# Patient Record
Sex: Male | Born: 1950 | Race: Black or African American | Hispanic: No | Marital: Married | Smoking: Never smoker
Health system: Southern US, Community
[De-identification: ages and names within clinical notes are randomized; demographics above are authoritative.]

## PROBLEM LIST (undated history)

## (undated) DIAGNOSIS — I10 Essential (primary) hypertension: Secondary | ICD-10-CM

## (undated) DIAGNOSIS — E876 Hypokalemia: Secondary | ICD-10-CM

## (undated) DIAGNOSIS — K219 Gastro-esophageal reflux disease without esophagitis: Secondary | ICD-10-CM

## (undated) DIAGNOSIS — R011 Cardiac murmur, unspecified: Secondary | ICD-10-CM

## (undated) DIAGNOSIS — R972 Elevated prostate specific antigen [PSA]: Secondary | ICD-10-CM

## (undated) DIAGNOSIS — N400 Enlarged prostate without lower urinary tract symptoms: Secondary | ICD-10-CM

## (undated) DIAGNOSIS — F329 Major depressive disorder, single episode, unspecified: Secondary | ICD-10-CM

## (undated) DIAGNOSIS — F32A Depression, unspecified: Secondary | ICD-10-CM

## (undated) DIAGNOSIS — M722 Plantar fascial fibromatosis: Secondary | ICD-10-CM

## (undated) DIAGNOSIS — N529 Male erectile dysfunction, unspecified: Secondary | ICD-10-CM

## (undated) DIAGNOSIS — E669 Obesity, unspecified: Secondary | ICD-10-CM

## (undated) HISTORY — DX: Obesity, unspecified: E66.9

## (undated) HISTORY — DX: Essential (primary) hypertension: I10

## (undated) HISTORY — DX: Gastro-esophageal reflux disease without esophagitis: K21.9

## (undated) HISTORY — DX: Benign prostatic hyperplasia without lower urinary tract symptoms: N40.0

## (undated) HISTORY — DX: Male erectile dysfunction, unspecified: N52.9

## (undated) HISTORY — PX: KNEE SURGERY: SHX244

## (undated) HISTORY — DX: Hypokalemia: E87.6

## (undated) HISTORY — DX: Plantar fascial fibromatosis: M72.2

## (undated) HISTORY — DX: Elevated prostate specific antigen (PSA): R97.20

---

## 2004-07-27 ENCOUNTER — Ambulatory Visit: Payer: Self-pay | Admitting: Internal Medicine

## 2004-08-22 ENCOUNTER — Ambulatory Visit: Payer: Self-pay | Admitting: Internal Medicine

## 2004-10-03 ENCOUNTER — Ambulatory Visit: Payer: Self-pay | Admitting: Internal Medicine

## 2004-11-07 ENCOUNTER — Ambulatory Visit: Payer: Self-pay | Admitting: Internal Medicine

## 2005-02-17 ENCOUNTER — Ambulatory Visit: Payer: Self-pay | Admitting: Internal Medicine

## 2005-05-23 ENCOUNTER — Ambulatory Visit: Payer: Self-pay | Admitting: Internal Medicine

## 2005-05-30 ENCOUNTER — Ambulatory Visit: Payer: Self-pay | Admitting: Gastroenterology

## 2005-06-21 ENCOUNTER — Ambulatory Visit: Payer: Self-pay | Admitting: Gastroenterology

## 2005-06-21 ENCOUNTER — Encounter (INDEPENDENT_AMBULATORY_CARE_PROVIDER_SITE_OTHER): Payer: Self-pay | Admitting: Specialist

## 2005-10-05 ENCOUNTER — Ambulatory Visit: Payer: Self-pay | Admitting: Internal Medicine

## 2006-09-24 ENCOUNTER — Ambulatory Visit: Payer: Self-pay | Admitting: Internal Medicine

## 2007-02-12 ENCOUNTER — Ambulatory Visit: Payer: Self-pay | Admitting: Internal Medicine

## 2007-02-12 LAB — CONVERTED CEMR LAB
ALT: 26 units/L (ref 0–40)
AST: 26 units/L (ref 0–37)
Albumin: 3.6 g/dL (ref 3.5–5.2)
Alkaline Phosphatase: 56 units/L (ref 39–117)
BUN: 16 mg/dL (ref 6–23)
Basophils Absolute: 0 10*3/uL (ref 0.0–0.1)
Calcium: 8.9 mg/dL (ref 8.4–10.5)
Chloride: 106 meq/L (ref 96–112)
Cholesterol: 155 mg/dL (ref 0–200)
Creatinine, Ser: 0.8 mg/dL (ref 0.4–1.5)
Eosinophils Absolute: 0.1 10*3/uL (ref 0.0–0.6)
GFR calc Af Amer: 129 mL/min
GFR calc non Af Amer: 106 mL/min
HCT: 40.5 % (ref 39.0–52.0)
Hemoglobin: 14 g/dL (ref 13.0–17.0)
LDL Cholesterol: 104 mg/dL — ABNORMAL HIGH (ref 0–99)
MCHC: 34.6 g/dL (ref 30.0–36.0)
Neutrophils Relative %: 45.1 % (ref 43.0–77.0)
RBC: 4.8 M/uL (ref 4.22–5.81)
RDW: 13.7 % (ref 11.5–14.6)
Sodium: 144 meq/L (ref 135–145)
Total Bilirubin: 0.8 mg/dL (ref 0.3–1.2)
Total CHOL/HDL Ratio: 3.6

## 2007-02-19 ENCOUNTER — Ambulatory Visit: Payer: Self-pay | Admitting: Internal Medicine

## 2007-02-20 ENCOUNTER — Encounter: Payer: Self-pay | Admitting: Internal Medicine

## 2007-02-20 DIAGNOSIS — K219 Gastro-esophageal reflux disease without esophagitis: Secondary | ICD-10-CM

## 2007-02-20 DIAGNOSIS — I10 Essential (primary) hypertension: Secondary | ICD-10-CM

## 2007-02-20 DIAGNOSIS — N400 Enlarged prostate without lower urinary tract symptoms: Secondary | ICD-10-CM | POA: Insufficient documentation

## 2007-02-20 HISTORY — DX: Essential (primary) hypertension: I10

## 2007-02-20 HISTORY — DX: Benign prostatic hyperplasia without lower urinary tract symptoms: N40.0

## 2007-02-20 HISTORY — DX: Gastro-esophageal reflux disease without esophagitis: K21.9

## 2007-04-01 ENCOUNTER — Emergency Department (HOSPITAL_COMMUNITY): Admission: EM | Admit: 2007-04-01 | Discharge: 2007-04-01 | Payer: Self-pay | Admitting: Emergency Medicine

## 2007-09-30 ENCOUNTER — Ambulatory Visit: Payer: Self-pay | Admitting: Internal Medicine

## 2007-09-30 DIAGNOSIS — M722 Plantar fascial fibromatosis: Secondary | ICD-10-CM

## 2007-09-30 HISTORY — DX: Plantar fascial fibromatosis: M72.2

## 2008-02-24 ENCOUNTER — Ambulatory Visit: Payer: Self-pay | Admitting: Internal Medicine

## 2008-02-24 LAB — CONVERTED CEMR LAB
AST: 28 units/L (ref 0–37)
Albumin: 3.6 g/dL (ref 3.5–5.2)
Alkaline Phosphatase: 65 units/L (ref 39–117)
BUN: 11 mg/dL (ref 6–23)
Bilirubin Urine: NEGATIVE
Bilirubin, Direct: 0.1 mg/dL (ref 0.0–0.3)
Blood in Urine, dipstick: NEGATIVE
Chloride: 100 meq/L (ref 96–112)
Eosinophils Absolute: 0.1 10*3/uL (ref 0.0–0.7)
Eosinophils Relative: 3 % (ref 0.0–5.0)
GFR calc non Af Amer: 106 mL/min
Glucose, Bld: 100 mg/dL — ABNORMAL HIGH (ref 70–99)
HDL: 37.6 mg/dL — ABNORMAL LOW (ref >39.0)
Monocytes Relative: 9.8 % (ref 3.0–12.0)
Neutrophils Relative %: 37 % — ABNORMAL LOW (ref 43.0–77.0)
Platelets: 237 10*3/uL (ref 150–400)
Potassium: 3.4 meq/L — ABNORMAL LOW (ref 3.5–5.1)
Protein, U semiquant: NEGATIVE
Sodium: 140 meq/L (ref 135–145)
Total CHOL/HDL Ratio: 4.2
Urobilinogen, UA: 0.2
VLDL: 14 mg/dL (ref 0–40)
WBC: 4 10*3/uL — ABNORMAL LOW (ref 4.5–10.5)
pH: 5

## 2008-03-02 ENCOUNTER — Ambulatory Visit: Payer: Self-pay | Admitting: Internal Medicine

## 2008-11-20 ENCOUNTER — Telehealth: Payer: Self-pay | Admitting: Internal Medicine

## 2008-11-23 ENCOUNTER — Ambulatory Visit: Payer: Self-pay | Admitting: Internal Medicine

## 2008-11-23 DIAGNOSIS — M25569 Pain in unspecified knee: Secondary | ICD-10-CM

## 2008-11-27 ENCOUNTER — Encounter: Payer: Self-pay | Admitting: Internal Medicine

## 2008-12-21 ENCOUNTER — Ambulatory Visit: Payer: Self-pay | Admitting: Internal Medicine

## 2008-12-21 DIAGNOSIS — R300 Dysuria: Secondary | ICD-10-CM

## 2008-12-21 LAB — CONVERTED CEMR LAB
Bilirubin Urine: NEGATIVE
Glucose, Urine, Semiquant: NEGATIVE
Ketones, urine, test strip: NEGATIVE
Protein, U semiquant: NEGATIVE
Urobilinogen, UA: 0.2
WBC Urine, dipstick: NEGATIVE

## 2009-02-23 ENCOUNTER — Ambulatory Visit: Payer: Self-pay | Admitting: Internal Medicine

## 2009-02-23 LAB — CONVERTED CEMR LAB
Bilirubin Urine: NEGATIVE
Blood in Urine, dipstick: NEGATIVE
Glucose, Urine, Semiquant: NEGATIVE
Ketones, urine, test strip: NEGATIVE
Specific Gravity, Urine: 1.025
pH: 6

## 2009-02-25 LAB — CONVERTED CEMR LAB
ALT: 27 units/L (ref 0–53)
AST: 31 units/L (ref 0–37)
BUN: 15 mg/dL (ref 6–23)
Bilirubin, Direct: 0.1 mg/dL (ref 0.0–0.3)
Cholesterol: 161 mg/dL (ref 0–200)
Eosinophils Relative: 3.7 % (ref 0.0–5.0)
GFR calc non Af Amer: 111.39 mL/min (ref >60)
HCT: 41 % (ref 39.0–52.0)
HDL: 40.1 mg/dL (ref >39.00)
LDL Cholesterol: 106 mg/dL — ABNORMAL HIGH (ref 0–99)
Lymphs Abs: 2.8 10*3/uL (ref 0.7–4.0)
Monocytes Relative: 13.8 % — ABNORMAL HIGH (ref 3.0–12.0)
Platelets: 225 10*3/uL (ref 150.0–400.0)
Potassium: 3.1 meq/L — ABNORMAL LOW (ref 3.5–5.1)
Sodium: 142 meq/L (ref 135–145)
TSH: 6.09 microintl units/mL — ABNORMAL HIGH (ref 0.35–5.50)
Total Bilirubin: 0.8 mg/dL (ref 0.3–1.2)
Total CHOL/HDL Ratio: 4
Triglycerides: 74 mg/dL (ref 0.0–149.0)
WBC: 5.8 10*3/uL (ref 4.5–10.5)

## 2009-02-26 ENCOUNTER — Encounter: Payer: Self-pay | Admitting: Internal Medicine

## 2009-02-26 DIAGNOSIS — R972 Elevated prostate specific antigen [PSA]: Secondary | ICD-10-CM | POA: Insufficient documentation

## 2009-02-26 HISTORY — DX: Elevated prostate specific antigen (PSA): R97.20

## 2009-03-02 ENCOUNTER — Ambulatory Visit: Payer: Self-pay | Admitting: Internal Medicine

## 2009-03-02 DIAGNOSIS — E876 Hypokalemia: Secondary | ICD-10-CM

## 2009-03-02 HISTORY — DX: Hypokalemia: E87.6

## 2009-03-19 ENCOUNTER — Encounter: Payer: Self-pay | Admitting: Internal Medicine

## 2009-04-26 ENCOUNTER — Ambulatory Visit: Payer: Self-pay | Admitting: Internal Medicine

## 2009-05-07 ENCOUNTER — Ambulatory Visit (HOSPITAL_COMMUNITY): Admission: RE | Admit: 2009-05-07 | Discharge: 2009-05-07 | Payer: Self-pay | Admitting: Specialist

## 2009-05-14 ENCOUNTER — Encounter: Payer: Self-pay | Admitting: Internal Medicine

## 2009-06-03 ENCOUNTER — Ambulatory Visit: Payer: Self-pay | Admitting: Internal Medicine

## 2009-06-14 ENCOUNTER — Telehealth: Payer: Self-pay | Admitting: Internal Medicine

## 2009-07-01 ENCOUNTER — Ambulatory Visit: Payer: Self-pay | Admitting: Internal Medicine

## 2009-09-14 ENCOUNTER — Telehealth: Payer: Self-pay | Admitting: Internal Medicine

## 2009-09-15 ENCOUNTER — Ambulatory Visit: Payer: Self-pay | Admitting: Family Medicine

## 2009-09-15 DIAGNOSIS — A088 Other specified intestinal infections: Secondary | ICD-10-CM

## 2009-09-15 DIAGNOSIS — E860 Dehydration: Secondary | ICD-10-CM

## 2009-09-30 ENCOUNTER — Ambulatory Visit: Payer: Self-pay | Admitting: Internal Medicine

## 2009-10-19 ENCOUNTER — Telehealth: Payer: Self-pay | Admitting: Internal Medicine

## 2009-10-19 ENCOUNTER — Encounter: Payer: Self-pay | Admitting: Internal Medicine

## 2009-10-27 ENCOUNTER — Ambulatory Visit: Payer: Self-pay | Admitting: Internal Medicine

## 2009-12-30 ENCOUNTER — Ambulatory Visit: Payer: Self-pay | Admitting: Internal Medicine

## 2009-12-30 DIAGNOSIS — N529 Male erectile dysfunction, unspecified: Secondary | ICD-10-CM

## 2009-12-30 HISTORY — DX: Male erectile dysfunction, unspecified: N52.9

## 2010-03-28 ENCOUNTER — Ambulatory Visit: Payer: Self-pay | Admitting: Internal Medicine

## 2010-03-28 DIAGNOSIS — M25519 Pain in unspecified shoulder: Secondary | ICD-10-CM | POA: Insufficient documentation

## 2010-06-30 ENCOUNTER — Ambulatory Visit: Payer: Self-pay | Admitting: Internal Medicine

## 2010-09-20 ENCOUNTER — Telehealth: Payer: Self-pay | Admitting: Internal Medicine

## 2010-10-20 NOTE — Assessment & Plan Note (Signed)
Summary: 1 wk rov/njr   Vital Signs:  Patient profile:   60 year old male Weight:      239 pounds Temp:     97.9 degrees F oral BP sitting:   140 / 80  (right arm) Cuff size:   large  Vitals Entered By: Raechel Ache, RN (October 27, 2009 11:27 AM) CC: 1 week f/u rov - c/o r flank pain , dizziness last noc   CC:  1 week f/u rov - c/o r flank pain  and dizziness last noc.  History of Present Illness: 60 year old patient who is seen today for follow-up of his hypertension.  he has resumed the Benicar HCT, and Sular, and states his blood pressures have been in the well-controlled.  He is not using Catapres.  No new concerns or complaints today.  He does have a history of gastroesophageal reflux disease.  He does not have a home blood pressure cuff, but does track.  Blood pressures that his local drugstore  Allergies: No Known Drug Allergies  Past History:  Past Medical History: Reviewed history from 03/02/2009 and no changes required. Hypertension Benign prostatic hypertrophy Mild obesity GERD right knee pain Elevated PSA  hypokalemia  Review of Systems  The patient denies anorexia, fever, weight loss, weight gain, vision loss, decreased hearing, hoarseness, chest pain, syncope, dyspnea on exertion, peripheral edema, prolonged cough, headaches, hemoptysis, abdominal pain, melena, hematochezia, severe indigestion/heartburn, hematuria, incontinence, genital sores, muscle weakness, suspicious skin lesions, transient blindness, difficulty walking, depression, unusual weight change, abnormal bleeding, enlarged lymph nodes, angioedema, breast masses, and testicular masses.    Physical Exam  General:  Well-developed,well-nourished,in no acute distress; alert,appropriate and cooperative throughout examination;  140/64   Impression & Recommendations:  Problem # 1:  GERD (ICD-530.81)  His updated medication list for this problem includes:    Aciphex 20 Mg Tbec (Rabeprazole  sodium) .Marland Kitchen... 1 stat repeat hs and then 1 qd  Problem # 2:  HYPERTENSION (ICD-401.9)  The following medications were removed from the medication list:    Catapres 0.1 Mg Tabs (Clonidine hcl) ..... One at bedtime His updated medication list for this problem includes:    Nisoldipine 34 Mg Xr24h-tab (Nisoldipine) ..... One daily    Benicar Hct 40-25 Mg Tabs (Olmesartan medoxomil-hctz) ..... One daily  Complete Medication List: 1)  Klor-con M20 20 Meq Cr-tabs (Potassium chloride crys cr) .... One daily 2)  Aciphex 20 Mg Tbec (Rabeprazole sodium) .Marland Kitchen.. 1 stat repeat hs and then 1 qd 3)  Imodium A-d 2 Mg Tabs (Loperamide hcl) .... As directed on package 4)  Nisoldipine 34 Mg Xr24h-tab (Nisoldipine) .... One daily 5)  Benicar Hct 40-25 Mg Tabs (Olmesartan medoxomil-hctz) .... One daily  Patient Instructions: 1)  Please schedule a follow-up appointment in 3 months. 2)  Limit your Sodium (Salt). 3)  It is important that you exercise regularly at least 20 minutes 5 times a week. If you develop chest pain, have severe difficulty breathing, or feel very tired , stop exercising immediately and seek medical attention. 4)  You need to lose weight. Consider a lower calorie diet and regular exercise.  Prescriptions: BENICAR HCT 40-25 MG TABS (OLMESARTAN MEDOXOMIL-HCTZ) one daily  #90 x 6   Entered and Authorized by:   Gordy Savers  MD   Signed by:   Gordy Savers  MD on 10/27/2009   Method used:   Print then Give to Patient   RxID:   [id number] NISOLDIPINE 34 MG XR24H-TAB (NISOLDIPINE)  one daily  #90 x 6   Entered and Authorized by:   Gordy Savers  MD   Signed by:   Gordy Savers  MD on 10/27/2009   Method used:   Print then Give to Patient   RxID:   [id number] ACIPHEX 20 MG TBEC (RABEPRAZOLE SODIUM) 1 stat repeat hs and then 1 qd  #0 x 0   Entered and Authorized by:   Gordy Savers  MD   Signed by:   Gordy Savers  MD on 10/27/2009   Method used:    Print then Give to Patient   RxID:   [id number] KLOR-CON M20 20 MEQ CR-TABS (POTASSIUM CHLORIDE CRYS CR) one daily  #90 x 6   Entered and Authorized by:   Gordy Savers  MD   Signed by:   Gordy Savers  MD on 10/27/2009   Method used:   Print then Give to Patient   RxID:   [id number]

## 2010-10-20 NOTE — Progress Notes (Signed)
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 [facility] [address] Initial call taken by: Kern Reap CMA Duncan Dull),  September 20, 2010 12:35 PM  Follow-up for Phone Call        left message on machine for patient to return our call.  name of rx? Follow-up by: Kern Reap CMA Duncan Dull),  September 20, 2010 5:02 PM  Additional Follow-up for Phone Call Additional follow up Details #1::        spoke with pt - requesting clonidine 0.1 at bedtime  this was removed from med list in 2010   please advise  Additional Follow-up by: Duard Brady LPN,  September 21, 2010 12:27 PM    Additional Follow-up for Phone Call Additional follow up Details #2::    OK to RF #30 Follow-up by: Gordy Savers  MD,  September 21, 2010 12:30 PM  Additional Follow-up for Phone Call Additional follow up Details #3:: Details for Additional Follow-up Action Taken: change to med list , [facility] - pt aware. KIK Additional Follow-up by: Duard Brady LPN,  September 21, 2010 12:43 PM  New/Updated Medications: CLONIDINE HCL 0.1 MG TABS (CLONIDINE HCL) qhs Prescriptions: CLONIDINE HCL 0.1 MG TABS (CLONIDINE HCL) qhs  #30 x 0   Entered by:   Duard Brady LPN   Authorized by:   Gordy Savers  MD   Signed by:   Duard Brady LPN on 13/04/6577   Method used:   Electronically to        [facility] (retail)       [address]       [place], [place]  [address]       Ph: [phone]       Fax: [phone]   RxID:   [id number]  OK to RF

## 2010-10-20 NOTE — Assessment & Plan Note (Signed)
Summary: acute onset of shoulder pain/dm   Vital Signs:  Patient profile:   60 year old male Weight:      230 pounds Temp:     98.8 degrees F oral BP sitting:   140 / 80  (right arm) Cuff size:   regular  Vitals Entered By: Duard Brady LPN (March 28, 2010 12:55 PM) CC: c/o (L) shoulder pain since sunday Is Patient Diabetic? No   CC:  c/o (L) shoulder pain since sunday.  History of Present Illness: 60 -year-old patient seen today for follow-up of his hypertension.  He has obtained a home blood pressure monitor and systolic readings are generally in the 150 to 155 range.  He has done well except for some left shoulder pain.  He states that he injured this to work 3 days ago when he attempted to pull himself up into a truck.  Otherwise, doing quite well.  He does have a history of BPH, and gastroesophageal reflux disease, which has been stable.  He has been using ibuprofen for his left shoulder pain  Allergies (verified): No Known Drug Allergies  Past History:  Past Medical History: Reviewed history from 12/30/2009 and no changes required. Hypertension Benign prostatic hypertrophy Mild obesity GERD right knee pain Elevated PSA  hypokalemia ED  Past Surgical History: Reviewed history from 03/02/2008 and no changes required. Colonoscopy October 2006  Review of Systems  The patient denies anorexia, fever, weight loss, weight gain, vision loss, decreased hearing, hoarseness, chest pain, syncope, dyspnea on exertion, peripheral edema, prolonged cough, headaches, hemoptysis, abdominal pain, melena, hematochezia, severe indigestion/heartburn, hematuria, incontinence, genital sores, muscle weakness, suspicious skin lesions, transient blindness, difficulty walking, depression, unusual weight change, abnormal bleeding, enlarged lymph nodes, angioedema, breast masses, and testicular masses.    Physical Exam  General:  overweight-appearing.  150/74 Head:  Normocephalic and  atraumatic without obvious abnormalities. No apparent alopecia or balding. Eyes:  No corneal or conjunctival inflammation noted. EOMI. Perrla. Funduscopic exam benign, without hemorrhages, exudates or papilledema. Vision grossly normal. Mouth:  Oral mucosa and oropharynx without lesions or exudates.  Teeth in good repair. Neck:  No deformities, masses, or tenderness noted. Lungs:  Normal respiratory effort, chest expands symmetrically. Lungs are clear to auscultation, no crackles or wheezes. Heart:  Normal rate and regular rhythm. S1 and S2 normal without gallop, murmur, click, rub or other extra sounds. Abdomen:  Bowel sounds positive,abdomen soft and non-tender without masses, organomegaly or hernias noted. Msk:  slight tenderness over the left posterior shoulder region to gentle palpation.  Range of motion was somewhat stiff and uncomfortable   Impression & Recommendations:  Problem # 1:  SHOULDER PAIN, LEFT (ICD-719.41)  Problem # 2:  HYPERTENSION (ICD-401.9)  His updated medication list for this problem includes:    Nisoldipine 34 Mg Xr24h-tab (Nisoldipine) ..... One daily    Benicar Hct 40-25 Mg Tabs (Olmesartan medoxomil-hctz) ..... One daily  Complete Medication List: 1)  Klor-con M20 20 Meq Cr-tabs (Potassium chloride crys cr) .... One daily 2)  Aciphex 20 Mg Tbec (Rabeprazole sodium) .Marland Kitchen.. 1 stat repeat hs and then 1 qd 3)  Imodium A-d 2 Mg Tabs (Loperamide hcl) .... As directed on package 4)  Nisoldipine 34 Mg Xr24h-tab (Nisoldipine) .... One daily 5)  Benicar Hct 40-25 Mg Tabs (Olmesartan medoxomil-hctz) .... One daily 6)  Viagra 100 Mg Tabs (Sildenafil citrate) .... One daily as directed  Patient Instructions: 1)  Please schedule a follow-up appointment in 6 months. 2)  Limit your Sodium (Salt).  3)  It is important that you exercise regularly at least 20 minutes 5 times a week. If you develop chest pain, have severe difficulty breathing, or feel very tired , stop exercising  immediately and seek medical attention. 4)  You need to lose weight. Consider a lower calorie diet and regular exercise.  5)  Check your Blood Pressure regularly. If it is above: 150/90  you should make an appointment. Prescriptions: BENICAR HCT 40-25 MG TABS (OLMESARTAN MEDOXOMIL-HCTZ) one daily  #90 x 6   Entered and Authorized by:   Gordy Savers  MD   Signed by:   Gordy Savers  MD on 03/28/2010   Method used:   Electronically to        [facility] (retail)       [address]       [place], [place]  [id number]       Ph: [phone]       Fax: [phone]   RxID:   [id number] NISOLDIPINE 34 MG XR24H-TAB (NISOLDIPINE) one daily  #90 x 6   Entered and Authorized by:   Gordy Savers  MD   Signed by:   Gordy Savers  MD on 03/28/2010   Method used:   Electronically to        [facility] (retail)       [address]       [place], [place]  [id number]       Ph: [phone]       Fax: [phone]   RxID:   [id number] ACIPHEX 20 MG TBEC (RABEPRAZOLE SODIUM) 1 stat repeat hs and then 1 qd  #90 x 6   Entered and Authorized by:   Gordy Savers  MD   Signed by:   Gordy Savers  MD on 03/28/2010   Method used:   Electronically to        [facility] (retail)       [address]       [place], [place]  [address]       Ph: [phone]       Fax: [phone]   RxID:   [id number] KLOR-CON M20 20 MEQ CR-TABS (POTASSIUM CHLORIDE CRYS CR) one daily  #90 x 6   Entered and Authorized by:   Gordy Savers  MD   Signed by:   Gordy Savers  MD on 03/28/2010   Method used:   Electronically to        [facility] (retail)       [address]       [place], [place]  [address]       Ph: [phone]       Fax: [phone]   RxID:   [id number]

## 2010-10-20 NOTE — Assessment & Plan Note (Signed)
Summary: 3 month rov/njr   Vital Signs:  Patient profile:   60 year old male Weight:      236 pounds Temp:     98.6 degrees F oral BP sitting:   130 / 70  (right arm) Cuff size:   regular  Vitals Entered By: Duard Brady LPN (December 30, 2009 11:31 AM) CC: 3 mos rov - doing well Is Patient Diabetic? No   CC:  3 mos rov - doing well.  History of Present Illness: 60 year old patient who is seen today for follow-up of his hypertension.  He has obtained a home blood pressure monitor and blood pressure readings have been nicely controlled.  He is on triple therapy.  He continues to tolerate his medications well without difficulty.  No new concerns or complaints.  He does request a prescription for Viagra  Allergies: No Known Drug Allergies  Past History:  Past Medical History: Hypertension Benign prostatic hypertrophy Mild obesity GERD right knee pain Elevated PSA  hypokalemia ED  Review of Systems  The patient denies anorexia, fever, weight loss, weight gain, vision loss, decreased hearing, hoarseness, chest pain, syncope, dyspnea on exertion, peripheral edema, prolonged cough, headaches, hemoptysis, abdominal pain, melena, hematochezia, severe indigestion/heartburn, hematuria, incontinence, genital sores, muscle weakness, suspicious skin lesions, transient blindness, difficulty walking, depression, unusual weight change, abnormal bleeding, enlarged lymph nodes, angioedema, breast masses, and testicular masses.    Physical Exam  General:  overweight-appearing.  normal blood pressure Head:  Normocephalic and atraumatic without obvious abnormalities. No apparent alopecia or balding. Eyes:  No corneal or conjunctival inflammation noted. EOMI. Perrla. Funduscopic exam benign, without hemorrhages, exudates or papilledema. Vision grossly normal. Mouth:  Oral mucosa and oropharynx without lesions or exudates.  Teeth in good repair. Neck:  No deformities, masses, or tenderness  noted. Lungs:  Normal respiratory effort, chest expands symmetrically. Lungs are clear to auscultation, no crackles or wheezes. Heart:  Normal rate and regular rhythm. S1 and S2 normal without gallop, murmur, click, rub or other extra sounds. Abdomen:  Bowel sounds positive,abdomen soft and non-tender without masses, organomegaly or hernias noted. Msk:  No deformity or scoliosis noted of thoracic or lumbar spine.   Pulses:  R and L carotid,radial,femoral,dorsalis pedis and posterior tibial pulses are full and equal bilaterally   Impression & Recommendations:  Problem # 1:  HYPERTENSION (ICD-401.9)  His updated medication list for this problem includes:    Nisoldipine 34 Mg Xr24h-tab (Nisoldipine) ..... One daily    Benicar Hct 40-25 Mg Tabs (Olmesartan medoxomil-hctz) ..... One daily  Problem # 2:  ERECTILE DYSFUNCTION, ORGANIC (ICD-607.84)  His updated medication list for this problem includes:    Viagra 100 Mg Tabs (Sildenafil citrate) ..... One daily as directed  Complete Medication List: 1)  Klor-con M20 20 Meq Cr-tabs (Potassium chloride crys cr) .... One daily 2)  Aciphex 20 Mg Tbec (Rabeprazole sodium) .Marland Kitchen.. 1 stat repeat hs and then 1 qd 3)  Imodium A-d 2 Mg Tabs (Loperamide hcl) .... As directed on package 4)  Nisoldipine 34 Mg Xr24h-tab (Nisoldipine) .... One daily 5)  Benicar Hct 40-25 Mg Tabs (Olmesartan medoxomil-hctz) .... One daily 6)  Viagra 100 Mg Tabs (Sildenafil citrate) .... One daily as directed  Patient Instructions: 1)  Limit your Sodium (Salt) to less than 2 grams a day(slightly less than 1/2 a teaspoon) to prevent fluid retention, swelling, or worsening of symptoms. 2)  It is important that you exercise regularly at least 20 minutes 5 times a week. If you  develop chest pain, have severe difficulty breathing, or feel very tired , stop exercising immediately and seek medical attention. 3)  You need to lose weight. Consider a lower calorie diet and regular  exercise.  4)  Check your Blood Pressure regularly. If it is above: 150/90 you should make an appointment. Prescriptions: VIAGRA 100 MG TABS (SILDENAFIL CITRATE) one daily as directed  #12 x 12   Entered and Authorized by:   Gordy Savers  MD   Signed by:   Gordy Savers  MD on 12/30/2009   Method used:   Print then Give to Patient   RxID:   [id number] VIAGRA 100 MG TABS (SILDENAFIL CITRATE) one daily as directed  #12 x 12   Entered and Authorized by:   Gordy Savers  MD   Signed by:   Gordy Savers  MD on 12/30/2009   Method used:   Electronically to        [facility] (retail)       [address]       [place], [place]  [id number]       Ph: [phone]       Fax: [phone]   RxID:   [id number] BENICAR HCT 40-25 MG TABS (OLMESARTAN MEDOXOMIL-HCTZ) one daily  #90 x 6   Entered and Authorized by:   Gordy Savers  MD   Signed by:   Gordy Savers  MD on 12/30/2009   Method used:   Electronically to        [facility] (retail)       [address]       [place], [place]  [id number]       Ph: [phone]       Fax: [phone]   RxID:   [id number] NISOLDIPINE 34 MG XR24H-TAB (NISOLDIPINE) one daily  #90 x 6   Entered and Authorized by:   Gordy Savers  MD   Signed by:   Gordy Savers  MD on 12/30/2009   Method used:   Electronically to        [facility] (retail)       [address]       [place], [place]  [address]       Ph: [phone]       Fax: [phone]   RxID:   [id number] KLOR-CON M20 20 MEQ CR-TABS (POTASSIUM CHLORIDE CRYS CR) one daily  #90 x 6   Entered and Authorized by:   Gordy Savers  MD   Signed by:   Gordy Savers  MD on 12/30/2009   Method used:   Electronically to        [facility] (retail)       [address]       [place], [place]  [id number]       Ph: [phone]        Fax: [phone]   RxID:   [id number]

## 2010-10-20 NOTE — Letter (Signed)
Summary: Out of Work  Adult nurse at [employer]  [address]   [place], [place] [address]   Phone: [phone]  Fax: [phone]    October 19, 2009   Employee:  ARNOL MCGIBBON    To Whom It May Concern:   For Medical reasons, please excuse the above named employee from work for the following dates:  Start:   10-19-09  End:   10-20-09  If you need additional information, please feel free to contact our office.         Sincerely,    Gordy Savers  MD

## 2010-10-20 NOTE — Progress Notes (Signed)
Summary: tribenzor not working  Call back at [employer] [phone]   Caller: live Call For: kwia Summary of Call: He is here at [facility].  The Tribenzor is not working for his BP.  Today it is 202/60 224/89 240/?.  Requests to go back on the Sular & Benicar/HCT.  He does not the Catapres because it makes him feel out of it & he cannot work.  Asked nurse if it would be OK for him to work with those pressures and nurse told him no, he should lie down.  He will need an out of work note for today.  Please call him when he can pick up the meds & when the out of work note is ready.  [facility]. Initial call taken by: Rudy Jew, RN,  October 19, 2009 2:12 PM    Additional Follow-up for Phone Call Additional follow up Details #2::    ready for pick up; ROV next 1-2 weeks Follow-up by: Gordy Savers  MD,  October 19, 2009 5:11 PM  New/Updated Medications: NISOLDIPINE 34 MG XR24H-TAB (NISOLDIPINE) one daily BENICAR HCT 40-25 MG TABS (OLMESARTAN MEDOXOMIL-HCTZ) one daily Prescriptions: BENICAR HCT 40-25 MG TABS (OLMESARTAN MEDOXOMIL-HCTZ) one daily  #90 x 0   Entered and Authorized by:   Gordy Savers  MD   Signed by:   Gordy Savers  MD on 10/19/2009   Method used:   Print then Give to Patient   RxID:   [id number] NISOLDIPINE 34 MG XR24H-TAB (NISOLDIPINE) one daily  #90 x 0   Entered and Authorized by:   Gordy Savers  MD   Signed by:   Gordy Savers  MD on 10/19/2009   Method used:   Print then Give to Patient   RxID:   [id number]

## 2010-10-20 NOTE — Assessment & Plan Note (Signed)
Summary: 3 month rov/njr/pt rescd//ccm   Vital Signs:  Patient profile:   60 year old male Weight:      240 pounds Temp:     98.4 degrees F BP sitting:   140 / 84  (left arm) Cuff size:   regular  Vitals Entered By: Raechel Ache, RN (September 30, 2009 10:40 AM) CC: 3 mo ROV   CC:  3 mo ROV.  History of Present Illness: 60 year old patient seen today for follow-up of his hypertension.  He has gastroesophageal reflux disease.  During the week.  He did have some episode of nausea that has resolved.  No concerns or complaints today.  Allergies: No Known Drug Allergies  Past History:  Past Medical History: Reviewed history from 03/02/2009 and no changes required. Hypertension Benign prostatic hypertrophy Mild obesity GERD right knee pain Elevated PSA  hypokalemia  Past Surgical History: Reviewed history from 03/02/2008 and no changes required. Colonoscopy October 2006  Review of Systems  The patient denies anorexia, fever, weight loss, weight gain, vision loss, decreased hearing, hoarseness, chest pain, syncope, dyspnea on exertion, peripheral edema, prolonged cough, headaches, hemoptysis, abdominal pain, melena, hematochezia, severe indigestion/heartburn, hematuria, incontinence, genital sores, muscle weakness, suspicious skin lesions, transient blindness, difficulty walking, depression, unusual weight change, abnormal bleeding, enlarged lymph nodes, angioedema, breast masses, and testicular masses.    Physical Exam  General:  overweight-appearing.  140/80overweight-appearing.   Head:  Normocephalic and atraumatic without obvious abnormalities. No apparent alopecia or balding. Eyes:  No corneal or conjunctival inflammation noted. EOMI. Perrla. Funduscopic exam benign, without hemorrhages, exudates or papilledema. Vision grossly normal. Mouth:  Oral mucosa and oropharynx without lesions or exudates.  Teeth in good repair. Neck:  No deformities, masses, or tenderness  noted. Lungs:  Normal respiratory effort, chest expands symmetrically. Lungs are clear to auscultation, no crackles or wheezes. Heart:  Normal rate and regular rhythm. S1 and S2 normal without gallop, murmur, click, rub or other extra sounds. Msk:  No deformity or scoliosis noted of thoracic or lumbar spine.     Impression & Recommendations:  Problem # 1:  HYPERTENSION (ICD-401.9)  His updated medication list for this problem includes:    Tribenzor 40-10-25 Mg Tabs (Olmesartan-amlodipine-hctz) ..... One daily    Catapres 0.1 Mg Tabs (Clonidine hcl) ..... One at bedtime  His updated medication list for this problem includes:    Tribenzor 40-10-25 Mg Tabs (Olmesartan-amlodipine-hctz) ..... One daily    Catapres 0.1 Mg Tabs (Clonidine hcl) ..... One at bedtime  Problem # 2:  GERD (ICD-530.81)  His updated medication list for this problem includes:    Aciphex 20 Mg Tbec (Rabeprazole sodium) .Marland Kitchen... 1 stat repeat hs and then 1 qd    His updated medication list for this problem includes:    Aciphex 20 Mg Tbec (Rabeprazole sodium) .Marland Kitchen... 1 stat repeat hs and then 1 qd  Complete Medication List: 1)  Klor-con M20 20 Meq Cr-tabs (Potassium chloride crys cr) .... One daily 2)  Tribenzor 40-10-25 Mg Tabs (Olmesartan-amlodipine-hctz) .... One daily 3)  Catapres 0.1 Mg Tabs (Clonidine hcl) .... One at bedtime 4)  Aciphex 20 Mg Tbec (Rabeprazole sodium) .Marland Kitchen.. 1 stat repeat hs and then 1 qd 5)  Imodium A-d 2 Mg Tabs (Loperamide hcl) .... As directed on package  Other Orders: Prescription Created Electronically [phone])  Patient Instructions: 1)  Please schedule a follow-up appointment in 3 months. 2)  Limit your Sodium (Salt) to less than 2 grams a day(slightly less than 1/2 a  teaspoon) to prevent fluid retention, swelling, or worsening of symptoms. 3)  It is important that you exercise regularly at least 20 minutes 5 times a week. If you develop chest pain, have severe difficulty breathing, or  feel very tired , stop exercising immediately and seek medical attention. 4)  You need to lose weight. Consider a lower calorie diet and regular exercise.  5)  Check your Blood Pressure regularly. If it is above: 150/90  you should make an appointment. Prescriptions: TRIBENZOR 40-10-25 MG TABS (OLMESARTAN-AMLODIPINE-HCTZ) one daily  #90 x 0   Entered and Authorized by:   Gordy Savers  MD   Signed by:   Gordy Savers  MD on 09/30/2009   Method used:   Electronically to        [facility] (retail)       [address]       [place], [place]  [id number]       Ph: [phone]       Fax: [phone]   RxID:   [id number] KLOR-CON M20 20 MEQ CR-TABS (POTASSIUM CHLORIDE CRYS CR) one daily  #90 x 6   Entered and Authorized by:   Gordy Savers  MD   Signed by:   Gordy Savers  MD on 09/30/2009   Method used:   Electronically to        [facility] (retail)       [address]       [place], [place]  [address]       Ph: [phone]       Fax: [phone]   RxID:   [id number]

## 2010-10-20 NOTE — Assessment & Plan Note (Signed)
Summary: 6 month rov/njr   Vital Signs:  Patient profile:   60 year old male Weight:      233 pounds Temp:     98.1 degrees F oral BP sitting:   128 / 80  (left arm) Cuff size:   regular  Vitals Entered By: Duard Brady LPN (June 30, 2010 11:35 AM) CC: 6 mos rov - doing ok Is Patient Diabetic? No   CC:  6 mos rov - doing ok.  History of Present Illness: 60 year old patient who is seen today for follow-up of his hypertension.  He has done quite well on triple therapy.  No recent home blood pressure monitoring.  He also has a history of erectile dysfunction BPH, and gastroesophageal reflux disease.  Denies any cardiopulmonary complaints  Allergies (verified): No Known Drug Allergies  Past History:  Past Medical History: Reviewed history from 12/30/2009 and no changes required. Hypertension Benign prostatic hypertrophy Mild obesity GERD right knee pain Elevated PSA  hypokalemia ED  Physical Exam  General:  overweight-appearing.  140/75overweight-appearing.   Head:  Normocephalic and atraumatic without obvious abnormalities. No apparent alopecia or balding. Eyes:  No corneal or conjunctival inflammation noted. EOMI. Perrla. Funduscopic exam benign, without hemorrhages, exudates or papilledema. Vision grossly normal. Mouth:  Oral mucosa and oropharynx without lesions or exudates.  Teeth in good repair. Neck:  No deformities, masses, or tenderness noted. Lungs:  Normal respiratory effort, chest expands symmetrically. Lungs are clear to auscultation, no crackles or wheezes. Heart:  Normal rate and regular rhythm. S1 and S2 normal without gallop, murmur, click, rub or other extra sounds. Abdomen:  Bowel sounds positive,abdomen soft and non-tender without masses, organomegaly or hernias noted. Msk:  No deformity or scoliosis noted of thoracic or lumbar spine.   Pulses:  R and L carotid,radial,femoral,dorsalis pedis and posterior tibial pulses are full and equal  bilaterally Extremities:  No clubbing, cyanosis, edema, or deformity noted with normal full range of motion of all joints.     Impression & Recommendations:  Problem # 1:  GERD (ICD-530.81)  His updated medication list for this problem includes:    Aciphex 20 Mg Tbec (Rabeprazole sodium) .Marland Kitchen... 1 stat repeat hs and then 1 qd  His updated medication list for this problem includes:    Aciphex 20 Mg Tbec (Rabeprazole sodium) .Marland Kitchen... 1 stat repeat hs and then 1 qd  Problem # 2:  HYPERTENSION (ICD-401.9)  His updated medication list for this problem includes:    Nisoldipine 34 Mg Xr24h-tab (Nisoldipine) ..... One daily    Benicar Hct 40-25 Mg Tabs (Olmesartan medoxomil-hctz) ..... One daily  His updated medication list for this problem includes:    Nisoldipine 34 Mg Xr24h-tab (Nisoldipine) ..... One daily    Benicar Hct 40-25 Mg Tabs (Olmesartan medoxomil-hctz) ..... One daily  Complete Medication List: 1)  Klor-con M20 20 Meq Cr-tabs (Potassium chloride crys cr) .... One daily 2)  Aciphex 20 Mg Tbec (Rabeprazole sodium) .Marland Kitchen.. 1 stat repeat hs and then 1 qd 3)  Imodium A-d 2 Mg Tabs (Loperamide hcl) .... As directed on package 4)  Nisoldipine 34 Mg Xr24h-tab (Nisoldipine) .... One daily 5)  Benicar Hct 40-25 Mg Tabs (Olmesartan medoxomil-hctz) .... One daily 6)  Cialis 20 Mg Tabs (Tadalafil) .... One daily as directed  Patient Instructions: 1)  Please schedule a follow-up appointment in 6 months for annual exam 2)  Limit your Sodium (Salt). 3)  It is important that you exercise regularly at least 20 minutes 5 times a  week. If you develop chest pain, have severe difficulty breathing, or feel very tired , stop exercising immediately and seek medical attention. 4)  You need to lose weight. Consider a lower calorie diet and regular exercise.  5)  Check your Blood Pressure regularly. If it is above:  150/90 you should make an appointment. Prescriptions: CIALIS 20 MG TABS (TADALAFIL) one  daily as directed  #6 x 6   Entered and Authorized by:   Gordy Savers  MD   Signed by:   Gordy Savers  MD on 06/30/2010   Method used:   Electronically to        [facility] (retail)       [address]       [place], [place]  [id number]       Ph: [phone]       Fax: [phone]   RxID:   [id number] BENICAR HCT 40-25 MG TABS (OLMESARTAN MEDOXOMIL-HCTZ) one daily  #90 x 6   Entered and Authorized by:   Gordy Savers  MD   Signed by:   Gordy Savers  MD on 06/30/2010   Method used:   Electronically to        [facility] (retail)       [address]       [place], [place]  [id number]       Ph: [phone]       Fax: [phone]   RxID:   [id number] NISOLDIPINE 34 MG XR24H-TAB (NISOLDIPINE) one daily  #90 x 6   Entered and Authorized by:   Gordy Savers  MD   Signed by:   Gordy Savers  MD on 06/30/2010   Method used:   Electronically to        [facility] (retail)       [address]       [place], [place]  [id number]       Ph: [phone]       Fax: [phone]   RxID:   [id number] ACIPHEX 20 MG TBEC (RABEPRAZOLE SODIUM) 1 stat repeat hs and then 1 qd  #90 x 6   Entered and Authorized by:   Gordy Savers  MD   Signed by:   Gordy Savers  MD on 06/30/2010   Method used:   Electronically to        [facility] (retail)       [address]       [place], [place]  [address]       Ph: [phone]       Fax: [phone]   RxID:   [id number] KLOR-CON M20 20 MEQ CR-TABS (POTASSIUM CHLORIDE CRYS CR) one daily  #90 x 6   Entered and Authorized by:   Gordy Savers  MD   Signed by:   Gordy Savers  MD on 06/30/2010   Method used:   Electronically to        [facility] (retail)       [address]       [place], [place]  [id number]       Ph: [phone]       Fax:  [phone]   RxID:   [id number]

## 2010-12-19 ENCOUNTER — Encounter: Payer: Self-pay | Admitting: Internal Medicine

## 2010-12-19 ENCOUNTER — Ambulatory Visit (INDEPENDENT_AMBULATORY_CARE_PROVIDER_SITE_OTHER): Payer: BC Managed Care – PPO | Admitting: Internal Medicine

## 2010-12-19 DIAGNOSIS — M545 Low back pain: Secondary | ICD-10-CM

## 2010-12-19 DIAGNOSIS — I10 Essential (primary) hypertension: Secondary | ICD-10-CM

## 2010-12-19 DIAGNOSIS — R109 Unspecified abdominal pain: Secondary | ICD-10-CM

## 2010-12-19 LAB — POCT URINALYSIS DIPSTICK
Bilirubin, UA: NEGATIVE
Glucose, UA: NEGATIVE
Ketones, UA: NEGATIVE
Spec Grav, UA: 1.02

## 2010-12-19 NOTE — Patient Instructions (Signed)
Limit your sodium (Salt) intake  Please check your blood pressure on a regular basis.  If it is consistently greater than 150/90, please make an office appointment.  Call or return to clinic prn if these symptoms worsen or fail to improve as anticipated.  

## 2010-12-19 NOTE — Progress Notes (Signed)
  Subjective:    Patient ID: Javier [name], male    DOB: Jun 17, 1951, 60 y.o.   MRN: [id number]  HPI  60 year old patient who is seen today for followup. He is scheduled for an annual exam in the near future. He is treated hypertension. For the past week he has had intermittent right flank and right hip discomfort. The pain is intermittent and seems relieved by Advil and aggravated by certain movements no prior history of kidney stones. No GU symptoms. Denies any fever or chills. Urinalysis was reviewed and was unremarkable    Review of Systems  Genitourinary: Positive for flank pain.  Musculoskeletal:       Right hip discomfort       Objective:   Physical Exam  Constitutional: He is oriented to person, place, and time. He appears well-developed and well-nourished.       Overweight  HENT:  Head: Normocephalic.  Right Ear: External ear normal.  Left Ear: External ear normal.  Eyes: Conjunctivae and EOM are normal.  Neck: Normal range of motion.  Cardiovascular: Normal rate and normal heart sounds.   Pulmonary/Chest: Breath sounds normal.  Abdominal: Bowel sounds are normal.  Musculoskeletal: Normal range of motion. He exhibits no edema and no tenderness.       Negative straight leg test range of motion of the right hip appeared to be intact. Comfortable  without any discomfort  Neurological: He is alert and oriented to person, place, and time.  Psychiatric: He has a normal mood and affect. His behavior is normal.          Assessment & Plan:  Flank and right hip pain. Suspect musculoligamentous. He is pain-free at present and has done well with when necessary Advil will continue. He is scheduled for an annual exam the near future will followup at that time. He will call if symptoms intensify. Hypertension well controlled

## 2010-12-22 ENCOUNTER — Other Ambulatory Visit (INDEPENDENT_AMBULATORY_CARE_PROVIDER_SITE_OTHER): Payer: BC Managed Care – PPO

## 2010-12-22 DIAGNOSIS — Z0000 Encounter for general adult medical examination without abnormal findings: Secondary | ICD-10-CM

## 2010-12-22 LAB — BASIC METABOLIC PANEL
CO2: 31 mEq/L (ref 19–32)
Chloride: 103 mEq/L (ref 96–112)
Creatinine, Ser: 0.8 mg/dL (ref 0.4–1.5)
Glucose, Bld: 94 mg/dL (ref 70–99)
Potassium: 3.5 mEq/L (ref 3.5–5.1)
Sodium: 143 mEq/L (ref 135–145)

## 2010-12-22 LAB — PSA: PSA: 3.41 ng/mL (ref 0.10–4.00)

## 2010-12-22 LAB — CBC WITH DIFFERENTIAL/PLATELET
Basophils Absolute: 0 10*3/uL (ref 0.0–0.1)
Basophils Relative: 0.6 % (ref 0.0–3.0)
Eosinophils Absolute: 0.3 10*3/uL (ref 0.0–0.7)
HCT: 41.8 % (ref 39.0–52.0)
Hemoglobin: 14.2 g/dL (ref 13.0–17.0)
Lymphs Abs: 2.2 10*3/uL (ref 0.7–4.0)
MCHC: 34 g/dL (ref 30.0–36.0)
Neutro Abs: 2 10*3/uL (ref 1.4–7.7)
RDW: 14.7 % — ABNORMAL HIGH (ref 11.5–14.6)

## 2010-12-22 LAB — HEPATIC FUNCTION PANEL
AST: 29 U/L (ref 0–37)
Albumin: 3.4 g/dL — ABNORMAL LOW (ref 3.5–5.2)
Total Protein: 6.9 g/dL (ref 6.0–8.3)

## 2010-12-22 LAB — POCT URINALYSIS DIPSTICK
Bilirubin, UA: NEGATIVE
Ketones, UA: NEGATIVE
Leukocytes, UA: NEGATIVE
Protein, UA: NEGATIVE
Spec Grav, UA: 1.025

## 2010-12-22 LAB — LIPID PANEL
Cholesterol: 154 mg/dL (ref 0–200)
VLDL: 9.8 mg/dL (ref 0.0–40.0)

## 2010-12-22 LAB — TSH: TSH: 3.22 u[IU]/mL (ref 0.35–5.50)

## 2010-12-29 ENCOUNTER — Encounter: Payer: Self-pay | Admitting: Internal Medicine

## 2010-12-29 ENCOUNTER — Ambulatory Visit (INDEPENDENT_AMBULATORY_CARE_PROVIDER_SITE_OTHER): Payer: BC Managed Care – PPO | Admitting: Internal Medicine

## 2010-12-29 VITALS — BP 130/80 | HR 62 | Temp 97.8°F | Resp 18 | Ht 68.0 in | Wt 236.0 lb

## 2010-12-29 DIAGNOSIS — I10 Essential (primary) hypertension: Secondary | ICD-10-CM

## 2010-12-29 DIAGNOSIS — N529 Male erectile dysfunction, unspecified: Secondary | ICD-10-CM

## 2010-12-29 DIAGNOSIS — N400 Enlarged prostate without lower urinary tract symptoms: Secondary | ICD-10-CM

## 2010-12-29 DIAGNOSIS — Z0000 Encounter for general adult medical examination without abnormal findings: Secondary | ICD-10-CM

## 2010-12-29 MED ORDER — TADALAFIL 20 MG PO TABS: 20.0000 mg | ORAL_TABLET | Freq: Every day | ORAL | Status: DC | PRN

## 2010-12-29 MED ORDER — POTASSIUM CHLORIDE CRYS ER 20 MEQ PO TBCR: 20.0000 meq | EXTENDED_RELEASE_TABLET | Freq: Every day | ORAL | Status: DC

## 2010-12-29 MED ORDER — OLMESARTAN MEDOXOMIL-HCTZ 40-25 MG PO TABS: 1.0000 | ORAL_TABLET | Freq: Every day | ORAL | Status: DC

## 2010-12-29 MED ORDER — NISOLDIPINE ER 34 MG PO TB24: 34.0000 mg | ORAL_TABLET | Freq: Every day | ORAL | Status: DC

## 2010-12-29 NOTE — Patient Instructions (Signed)
Please check your blood pressure on a regular basis.  If it is consistently greater than 150/90, please make an office appointment.  Limit your sodium (Salt) intake    It is important that you exercise regularly, at least 20 minutes 3 to 4 times per week.  If you develop chest pain or shortness of breath seek  medical attention.  You need to lose weight.  Consider a lower calorie diet and regular exercise. 

## 2010-12-29 NOTE — Progress Notes (Signed)
  Subjective:    Patient ID: Javier [name], male    DOB: September 09, 1951, 60 y.o.   MRN: [id number]  HPI  is a 60 year old patient who has a history of multi-drug-resistant hypertension. He is still well on triple therapy. He has history of BPH and an elevated PSA in the past normal prostate biopsy in September of 2010. He had a colonoscopy in 2006. He is still well today without concerns or complaints.   Review of Systems  Constitutional: Negative for fever, chills, activity change, appetite change and fatigue.  HENT: Negative for hearing loss, ear pain, congestion, rhinorrhea, sneezing, mouth sores, trouble swallowing, neck pain, neck stiffness, dental problem, voice change, sinus pressure and tinnitus.   Eyes: Negative for photophobia, pain, redness and visual disturbance.  Respiratory: Negative for apnea, cough, choking, chest tightness, shortness of breath and wheezing.   Cardiovascular: Negative for chest pain, palpitations and leg swelling.  Gastrointestinal: Negative for nausea, vomiting, abdominal pain, diarrhea, constipation, blood in stool, abdominal distention, anal bleeding and rectal pain.  Genitourinary: Negative for dysuria, urgency, frequency, hematuria, flank pain, decreased urine volume, discharge, penile swelling, scrotal swelling, difficulty urinating, genital sores and testicular pain.  Musculoskeletal: Negative for myalgias, back pain, joint swelling, arthralgias and gait problem.  Skin: Negative for color change, rash and wound.  Neurological: Negative for dizziness, tremors, seizures, syncope, facial asymmetry, speech difficulty, weakness, light-headedness, numbness and headaches.  Hematological: Negative for adenopathy. Does not bruise/bleed easily.  Psychiatric/Behavioral: Negative for suicidal ideas, hallucinations, behavioral problems, confusion, sleep disturbance, self-injury, dysphoric mood, decreased concentration and agitation. The patient is not nervous/anxious.        Objective:   Physical Exam  Constitutional: He appears well-developed and well-nourished.       Overweight blood pressure 140/80  HENT:  Head: Normocephalic and atraumatic.  Right Ear: External ear normal.  Left Ear: External ear normal.  Nose: Nose normal.  Mouth/Throat: Oropharynx is clear and moist.  Eyes: Conjunctivae and EOM are normal. Pupils are equal, round, and reactive to light. No scleral icterus.  Neck: Normal range of motion. Neck supple. No JVD present. No thyromegaly present.  Cardiovascular: Regular rhythm, normal heart sounds and intact distal pulses.  Exam reveals no gallop and no friction rub.   No murmur heard. Pulmonary/Chest: Effort normal and breath sounds normal. He exhibits no tenderness.  Abdominal: Soft. Bowel sounds are normal. He exhibits no distension and no mass. There is no tenderness.  Genitourinary: Prostate normal and penis normal.  Musculoskeletal: Normal range of motion. He exhibits no edema and no tenderness.  Lymphadenopathy:    He has no cervical adenopathy.  Neurological: He is alert. He has normal reflexes. No cranial nerve deficit. Coordination normal.  Skin: Skin is warm and dry. No rash noted.  Psychiatric: He has a normal mood and affect. His behavior is normal.          Assessment & Plan:   Annual health assessment. Mild exogenous obesity Hypertension stable  Low salt diet regular exercise all encouraged. He has been asked to monitor blood pressure readings and return in 6 months for a followup visit;  weight loss encouraged

## 2011-01-31 NOTE — Assessment & Plan Note (Signed)
[facility] OFFICE NOTE   Javier [name], Javier [name]                       MRN:          [id number]  DATE:02/19/2007                            DOB:          1951-02-25    A 60 year old African-American male seen today for an annual exam.  He  has a history of hypertension.  Additionally, he has a history of mild  BPH, history of reflux, mild exogenous obesity.  He has done quite well.  He has had no hospital admissions.   FAMILY HISTORY:  Unchanged.  Mother died at 24 with a history of  hypertension, congestive heart failure, and renal failure.  Two sisters  1 with a history of breast cancer.   EXAM:  Reveals a well-developed, mildly overweight male in no acute  distress.  Blood pressure 150/70.  FUNDI, EARS, NOSE, AND THROAT:  Clear.  NECK:  No bruits or adenopathy.  CHEST:  Clear.  CARDIOVASCULAR:  Normal S1, S2.  No murmurs.  ABDOMEN:  Obese, soft, and nontender.  No organomegaly.  EXTERNAL GENITALIA:  Normal.  RECTAL:  Prostate +1 and benign.  Stool heme-negative.  EXTREMITIES:  Full peripheral pulses.  He had some mild stasis changes.  Trace edema.  NEURO:  Negative.   IMPRESSION:  1. Hypertension.  2. Mild obesity.  3. Benign prostatic hypertrophy.   DISPOSITION:  Medical regimen unchanged.  Reassess in 6 months.     Gordy Savers, MD  Electronically Signed    PFK/MedQ  DD: 02/19/2007  DT: 02/19/2007  Job #: [id number]

## 2011-04-20 ENCOUNTER — Ambulatory Visit: Payer: BC Managed Care – PPO | Admitting: Internal Medicine

## 2011-04-28 ENCOUNTER — Encounter: Payer: Self-pay | Admitting: Family Medicine

## 2011-04-28 ENCOUNTER — Ambulatory Visit (INDEPENDENT_AMBULATORY_CARE_PROVIDER_SITE_OTHER): Payer: BC Managed Care – PPO | Admitting: Family Medicine

## 2011-04-28 DIAGNOSIS — K219 Gastro-esophageal reflux disease without esophagitis: Secondary | ICD-10-CM

## 2011-04-28 NOTE — Patient Instructions (Signed)
Aciphex one daily and take before largest meal of day.  Diet for GERD or PUD Nutrition therapy can help ease the discomfort of gastroesophageal reflux disease (GERD) and peptic ulcer disease (PUD).  HOME CARE INSTRUCTIONS  Eat your meals slowly, in a relaxed setting.   Eat 5 to 6 small meals per day.   If a food causes distress, stop eating it for a period of time.  FOODS TO AVOID:  Coffee, regular or decaffeinated.  Cola beverages, regular or low calorie.   Tea, regular or decaffeinated.   Pepper.   Cocoa.   High fat foods including meats.   Butter, margarine, hydrogenated oil (trans fats).  Peppermint or spearmint (if you have GERD).   Fruits and vegetables as tolerated.   Alcoholic beverages.   Nicotine (smoking or chewing). This is one of the most potent stimulants to acid production in the gastrointestinal tract.   Any food that seems to aggravate your condition.   If you have questions regarding your diet, call your caregiver's office or a registered dietitian. OTHER TIPS IF YOU HAVE GERD:  Lying flat may make symptoms worse. Keep the head of your bed raised 6 to 9 inches by using a foam wedge or blocks under the legs of the bed.   Do not lay down until 3 hours after eating a meal.   Daily physical activity may help reduce symptoms.  MAKE SURE YOU:   Understand these instructions.   Will watch your condition.   Will get help right away if you are not doing well or get worse.  Document Released: 09/04/2005 Document Re-Released: 01/21/2009 [facility] Patient Information 2011 [place], [place].

## 2011-04-28 NOTE — Progress Notes (Signed)
  Subjective:    Patient ID: Javier [name], male    DOB: 10/27/1950, 60 y.o.   MRN: [id number]  HPI 2-1/2 week history of some midepigastric burning with occasional radiation toward upper sternal area. Started after eating some spaghetti. He was told by a friend to try vinegar and that seemed to worsen symptoms.  He tried TUMS without relief. Occasional bloated feeling. No nausea or vomiting. Denies chest pain. Symptoms also worse with soda, chocolate, or onions. Does have prior history of GERD. No hematemesis. No melena. Patient denies alcohol or nicotine use.  Hypertension and compliant with medications.   Review of Systems  Constitutional: Negative for appetite change and unexpected weight change.  HENT: Negative for sore throat, trouble swallowing and voice change.   Respiratory: Negative for cough and shortness of breath.   Cardiovascular: Negative for chest pain and palpitations.  Gastrointestinal: Negative for nausea, vomiting, diarrhea, constipation, blood in stool and abdominal distention.  Genitourinary: Negative for dysuria.       Objective:   Physical Exam  Constitutional: He appears well-developed and well-nourished.  HENT:  Mouth/Throat: Oropharynx is clear and moist.  Neck: Neck supple.  Cardiovascular: Normal rate, regular rhythm and normal heart sounds.   Pulmonary/Chest: Effort normal and breath sounds normal. No respiratory distress. He has no wheezes. He has no rales.  Abdominal: Soft. Bowel sounds are normal. He exhibits no distension and no mass. There is no tenderness. There is no rebound and no guarding.  Lymphadenopathy:    He has no cervical adenopathy.          Assessment & Plan:  GERD. Educational material given on bland diet. Reduce caffeine gradually. Samples of AcipHex 20 mg daily and if symptoms fully resolved after one month of therapy discontinue. Patient is encouraged to followup with primary physician if symptoms not resolved within 2 weeks

## 2011-05-03 ENCOUNTER — Telehealth: Payer: Self-pay | Admitting: *Deleted

## 2011-05-03 ENCOUNTER — Ambulatory Visit (INDEPENDENT_AMBULATORY_CARE_PROVIDER_SITE_OTHER): Payer: BC Managed Care – PPO | Admitting: Internal Medicine

## 2011-05-03 ENCOUNTER — Encounter: Payer: Self-pay | Admitting: Internal Medicine

## 2011-05-03 VITALS — BP 162/70 | HR 102 | Temp 98.3°F | Wt 234.0 lb

## 2011-05-03 DIAGNOSIS — I10 Essential (primary) hypertension: Secondary | ICD-10-CM

## 2011-05-03 MED ORDER — POTASSIUM CHLORIDE CRYS ER 20 MEQ PO TBCR: 20.0000 meq | EXTENDED_RELEASE_TABLET | Freq: Every day | ORAL | Status: DC

## 2011-05-03 MED ORDER — SPIRONOLACTONE 25 MG PO TABS: 12.5000 mg | ORAL_TABLET | Freq: Every day | ORAL | Status: DC

## 2011-05-03 NOTE — Progress Notes (Signed)
  Subjective:    Patient ID: Javier [name], male    DOB: 1951-05-18, 60 y.o.   MRN: [id number]  HPI  60 y/o AA male presents with elevated BP.  He has been participating in rehab after recent left knee surgery in July, 2012.   His usual BP readings are 150's at home.  Rehab staff noted SBP 180 after exercise.  He denies associated chest pain or headache.   He thinks elevated BP may be attributable to "fast food intake".  He does not follow low salt diet.  He took all of his BP meds today.    Review of Systems No chest pain,  No SOB  Past Medical History  Diagnosis Date  . BENIGN PROSTATIC HYPERTROPHY 02/20/2007  . ELEVATED PROSTATE SPECIFIC ANTIGEN 02/26/2009  . 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 FASCIITIS, PLANTAR 09/30/2007  . GERD 02/20/2007  . HYPERTENSION 02/20/2007  . HYPOKALEMIA 03/02/2009  . Obesity     History   Social History  . Marital Status: Married    Spouse Name: N/A    Number of Children: N/A  . Years of Education: N/A   Occupational History  . Not on file.   Social History Main Topics  . Smoking status: Never Smoker   . Smokeless tobacco: Never Used  . Alcohol Use: No  . Drug Use: No  . Sexually Active: Not on file   Other Topics Concern  . Not on file   Social History Narrative  . No narrative on file    No past surgical history on file.  No family history on file.  No Known Allergies  Current Outpatient Prescriptions on File Prior to Visit  Medication Sig Dispense Refill  . nisoldipine (SULAR) 34 MG 24 hr tablet Take 1 tablet (34 mg total) by mouth daily.  90 tablet  6  . olmesartan-hydrochlorothiazide (BENICAR HCT) 40-25 MG per tablet Take 1 tablet by mouth daily.  90 tablet  6  . tadalafil (CIALIS) 20 MG tablet Take 1 tablet (20 mg total) by mouth daily as needed.  10 tablet  6    BP 162/70  Pulse 102  Temp(Src) 98.3 F (36.8 C) (Oral)  Wt 234 lb (106.142 kg)  Previous BMET of 4/5 reviewed.  Potassium 3.5    Objective:   Physical Exam   Constitutional: overweight appearing, pleasant, NAD Head: Normocephalic and atraumatic.  Neck: Neck supple.  No carotid bruit Cardiovascular: Normal rate, regular rhythm and normal heart sounds.  Exam reveals no gallop and no friction rub.   No murmur heard. Pulmonary/Chest: Effort normal and breath sounds normal.  No wheezes. No rales.  Abdominal: Soft. Bowel sounds are normal. No mass. There is no tenderness.  Ext:  Trace edema bilaterally      Assessment & Plan:

## 2011-05-03 NOTE — Assessment & Plan Note (Signed)
Uncontrolled hypertension.  Explained to pt that bp will normally rise after exercise.  I suggest adding low dose aldactone.  We considered lasix but it would like exacerbate hypokalemia.  Pt advised to reduce his current potassium dose to 10 mEq.   Repeat BMET within 1 week to monitor potassium and Cr  I encouraged low salt diet and increase intake of fruits and vegetables.  Follow up with Dr. Kirtland Bouchard in 2 weeks  BP: 162/70 mmHg     Chemistry      Component Value Date/Time   NA 143 12/22/2010 0839   K 3.5 12/22/2010 0839   CL 103 12/22/2010 0839   CO2 31 12/22/2010 0839   BUN 15 12/22/2010 0839   CREATININE 0.8 12/22/2010 0839      Component Value Date/Time   CALCIUM 8.5 12/22/2010 0839   ALKPHOS 71 12/22/2010 0839   AST 29 12/22/2010 0839   ALT 28 12/22/2010 0839   BILITOT 0.9 12/22/2010 1610

## 2011-05-03 NOTE — Telephone Encounter (Signed)
Pt walked in stating he had two high BP readings at Rehab today 170/90 and wants to see Dr. Kirtland Bouchard. Scheduled for tomorrow am but he does not want to wait.  Will see Dr. Artist Pais today.

## 2011-05-04 ENCOUNTER — Ambulatory Visit: Payer: BC Managed Care – PPO | Admitting: Internal Medicine

## 2011-05-08 ENCOUNTER — Emergency Department (HOSPITAL_COMMUNITY): Payer: BC Managed Care – PPO

## 2011-05-08 ENCOUNTER — Emergency Department (HOSPITAL_COMMUNITY)
Admission: EM | Admit: 2011-05-08 | Discharge: 2011-05-08 | Disposition: A | Discharge status: Home / Self Care | Payer: BC Managed Care – PPO | Attending: Emergency Medicine | Admitting: Emergency Medicine

## 2011-05-08 DIAGNOSIS — R42 Dizziness and giddiness: Secondary | ICD-10-CM | POA: Insufficient documentation

## 2011-05-08 DIAGNOSIS — Z79899 Other long term (current) drug therapy: Secondary | ICD-10-CM | POA: Insufficient documentation

## 2011-05-08 DIAGNOSIS — F411 Generalized anxiety disorder: Secondary | ICD-10-CM | POA: Insufficient documentation

## 2011-05-08 DIAGNOSIS — R55 Syncope and collapse: Secondary | ICD-10-CM | POA: Insufficient documentation

## 2011-05-08 DIAGNOSIS — I10 Essential (primary) hypertension: Secondary | ICD-10-CM | POA: Insufficient documentation

## 2011-05-08 LAB — COMPREHENSIVE METABOLIC PANEL
ALT: 23 U/L (ref 0–53)
AST: 23 U/L (ref 0–37)
Alkaline Phosphatase: 91 U/L (ref 39–117)
CO2: 24 mEq/L (ref 19–32)
Chloride: 97 mEq/L (ref 96–112)
GFR calc Af Amer: >60 mL/min (ref >60)
GFR calc non Af Amer: >60 mL/min (ref >60)
Glucose, Bld: 153 mg/dL — ABNORMAL HIGH (ref 70–99)
Potassium: 3.6 mEq/L (ref 3.5–5.1)
Sodium: 133 mEq/L — ABNORMAL LOW (ref 135–145)

## 2011-05-08 LAB — URINALYSIS, ROUTINE W REFLEX MICROSCOPIC
Bilirubin Urine: NEGATIVE
Glucose, UA: NEGATIVE mg/dL
Hgb urine dipstick: NEGATIVE
Protein, ur: NEGATIVE mg/dL
Urobilinogen, UA: 0.2 mg/dL (ref 0.0–1.0)

## 2011-05-08 LAB — CBC
Hemoglobin: 13.9 g/dL (ref 13.0–17.0)
MCH: 28.4 pg (ref 26.0–34.0)
MCHC: 34.1 g/dL (ref 30.0–36.0)
MCV: 83.3 fL (ref 78.0–100.0)
RBC: 4.9 MIL/uL (ref 4.22–5.81)

## 2011-05-08 LAB — DIFFERENTIAL
Basophils Relative: 0 % (ref 0–1)
Eosinophils Absolute: 0 10*3/uL (ref 0.0–0.7)
Lymphs Abs: 1.3 10*3/uL (ref 0.7–4.0)
Monocytes Absolute: 0.5 10*3/uL (ref 0.1–1.0)
Monocytes Relative: 7 % (ref 3–12)
Neutro Abs: 5.2 10*3/uL (ref 1.7–7.7)
Neutrophils Relative %: 74 % (ref 43–77)

## 2011-05-08 LAB — POCT I-STAT TROPONIN I

## 2011-05-08 LAB — PRO B NATRIURETIC PEPTIDE: Pro B Natriuretic peptide (BNP): 127 pg/mL — ABNORMAL HIGH (ref 0–125)

## 2011-05-08 LAB — GLUCOSE, CAPILLARY: Glucose-Capillary: 170 mg/dL — ABNORMAL HIGH (ref 70–99)

## 2011-05-09 LAB — URINE CULTURE
Culture  Setup Time: 201208210105
Culture: NO GROWTH

## 2011-05-10 ENCOUNTER — Encounter: Payer: Self-pay | Admitting: Internal Medicine

## 2011-05-10 ENCOUNTER — Other Ambulatory Visit: Payer: BC Managed Care – PPO

## 2011-05-10 ENCOUNTER — Ambulatory Visit (INDEPENDENT_AMBULATORY_CARE_PROVIDER_SITE_OTHER): Payer: BC Managed Care – PPO | Admitting: Internal Medicine

## 2011-05-10 VITALS — BP 180/90 | Temp 98.4°F | Wt 230.0 lb

## 2011-05-10 DIAGNOSIS — F411 Generalized anxiety disorder: Secondary | ICD-10-CM

## 2011-05-10 DIAGNOSIS — F419 Anxiety disorder, unspecified: Secondary | ICD-10-CM

## 2011-05-10 DIAGNOSIS — I10 Essential (primary) hypertension: Secondary | ICD-10-CM

## 2011-05-10 DIAGNOSIS — K219 Gastro-esophageal reflux disease without esophagitis: Secondary | ICD-10-CM

## 2011-05-10 LAB — BASIC METABOLIC PANEL
Calcium: 8.9 mg/dL (ref 8.4–10.5)
Creatinine, Ser: 0.9 mg/dL (ref 0.4–1.5)
GFR: 109.15 mL/min (ref >60.00)
Sodium: 139 mEq/L (ref 135–145)

## 2011-05-10 MED ORDER — CLONAZEPAM 0.5 MG PO TABS: 0.5000 mg | ORAL_TABLET | Freq: Two times a day (BID) | ORAL | Status: DC

## 2011-05-10 NOTE — Progress Notes (Signed)
  Subjective:    Patient ID: Javier [name], male    DOB: 1951-04-23, 60 y.o.   MRN: [id number]  HPI  60 year old patient who has a long history of hypertension. He was evaluated and treated at the ED 2 days ago after an episode of near syncope. He states that he has been quite anxious and also states that he has what he describes as a nervous breakdown in the 65s. Is requesting referral for behavioral health. He states that he has been out of work since May of this year following a knee injury he was operated on for a meniscal tear on July 31. He does not feel he is able to return to work due to his extreme anxiety and nervousness. 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 He remains compliant with his blood pressure medication    Review of Systems  Constitutional: Negative for fever, chills, appetite change and fatigue.  HENT: Negative for hearing loss, ear pain, congestion, sore throat, trouble swallowing, neck stiffness, dental problem, voice change and tinnitus.   Eyes: Negative for pain, discharge and visual disturbance.  Respiratory: Negative for cough, chest tightness, wheezing and stridor.   Cardiovascular: Negative for chest pain, palpitations and leg swelling.  Gastrointestinal: Negative for nausea, vomiting, abdominal pain, diarrhea, constipation, blood in stool and abdominal distention.  Genitourinary: Negative for urgency, hematuria, flank pain, discharge, difficulty urinating and genital sores.  Musculoskeletal: Negative for myalgias, back pain, joint swelling, arthralgias and gait problem.  Skin: Negative for rash.  Neurological: Negative for dizziness, syncope, speech difficulty, weakness, numbness and headaches.  Hematological: Negative for adenopathy. Does not bruise/bleed easily.  Psychiatric/Behavioral: Positive for behavioral problems and sleep disturbance. Negative for dysphoric  mood. The patient is nervous/anxious.        Objective:   Physical Exam  Constitutional: He is oriented to person, place, and time. He appears well-developed.       Repeat blood pressure 146/84  HENT:  Head: Normocephalic.  Right Ear: External ear normal.  Left Ear: External ear normal.  Eyes: Conjunctivae and EOM are normal.  Neck: Normal range of motion.  Cardiovascular: Normal rate and normal heart sounds.   Pulmonary/Chest: Breath sounds normal.  Abdominal: Bowel sounds are normal.  Musculoskeletal: Normal range of motion. He exhibits no edema and no tenderness.  Neurological: He is alert and oriented to person, place, and time.  Psychiatric: He has a normal mood and affect. His behavior is normal.          Assessment & Plan:

## 2011-05-10 NOTE — Patient Instructions (Signed)
Limit your sodium (Salt) intake  Please check your blood pressure on a regular basis.  If it is consistently greater than 150/90, please make an office appointment.  Return in one month for follow-up  

## 2011-05-11 ENCOUNTER — Encounter: Payer: Self-pay | Admitting: Internal Medicine

## 2011-05-11 NOTE — Progress Notes (Signed)
Quick Note:  Hm phone number has been disconnected - letter sent to notify pt of lab , med change and to contact office. ______

## 2011-05-16 ENCOUNTER — Telehealth: Payer: Self-pay | Admitting: Internal Medicine

## 2011-05-16 NOTE — Telephone Encounter (Signed)
Noted - will try and contact via mail

## 2011-05-16 NOTE — Telephone Encounter (Signed)
Opened in error

## 2011-05-16 NOTE — Telephone Encounter (Signed)
Left message on triage line. He received Dr. Charm Rings letter and was calling back. I tried to call him to set up the 2wk sppt regarding his K+ levels. The number he left for [place] on the triage line was the same one we have, and it is disconnected.

## 2011-05-18 ENCOUNTER — Ambulatory Visit: Payer: BC Managed Care – PPO | Admitting: Internal Medicine

## 2011-05-30 ENCOUNTER — Encounter: Payer: Self-pay | Admitting: Internal Medicine

## 2011-05-30 ENCOUNTER — Ambulatory Visit (INDEPENDENT_AMBULATORY_CARE_PROVIDER_SITE_OTHER): Payer: BC Managed Care – PPO | Admitting: Internal Medicine

## 2011-05-30 DIAGNOSIS — N400 Enlarged prostate without lower urinary tract symptoms: Secondary | ICD-10-CM

## 2011-05-30 DIAGNOSIS — E119 Type 2 diabetes mellitus without complications: Secondary | ICD-10-CM

## 2011-05-30 DIAGNOSIS — I10 Essential (primary) hypertension: Secondary | ICD-10-CM

## 2011-05-30 MED ORDER — SPIRONOLACTONE 25 MG PO TABS: 12.5000 mg | ORAL_TABLET | Freq: Every day | ORAL | Status: DC

## 2011-05-30 MED ORDER — METFORMIN HCL ER 500 MG PO TB24: 500.0000 mg | ORAL_TABLET | Freq: Every day | ORAL | Status: DC

## 2011-05-30 MED ORDER — CLONAZEPAM 0.5 MG PO TABS: 0.5000 mg | ORAL_TABLET | Freq: Two times a day (BID) | ORAL | Status: DC

## 2011-05-30 NOTE — Patient Instructions (Addendum)
Limit your sodium (Salt) intake    It is important that you exercise regularly, at least 20 minutes 3 to 4 times per week.  If you develop chest pain or shortness of breath seek  medical attention.  You need to lose weight.  Consider a lower calorie diet and regular exercise.   Please check your hemoglobin A1c every 3 months  Diabetes, Type 2, Am I At Risk? Diabetes is a lasting (chronic) disease. In type 2 diabetes, the pancreas does not make enough insulin, and the body does not respond normally to the insulin that is made. This type of diabetes was also previously called adult onset diabetes. About 90% of all those who have diabetes have type 2. It usually occurs after the age of 5, but can occur at any age.   People develop type 2 diabetes because they do not use insulin properly. Eventually, the pancreas cannot make enough insulin for the body's needs. Over time, the amount of glucose (sugar) in the blood increases. RISK FACTORS FOR TYPE 2 DIABETES?  Overweight - the more weight you have, the more resistant your cells become to insulin.   Family history - you are more likely to get diabetes if a parent or sibling has diabetes.   Race - certain races get diabetes more.   African Americans.   American Indians.   Asian Americans.   Hispanics.   Pacific Islander.   Inactive - exercise helps control weight and helps your cells be more sensitive to insulin.   Gestational diabetes - some women develop diabetes while they are pregnant. This goes away when they deliver. However, they are 50-60% more likely to develop type 2 diabetes at a later time.   Having a baby over 9 pounds - a sign that you may have had gestational diabetes.   Age - the risk of diabetes goes up as you get older, especially after age 72.   High blood pressure (hypertension).  WHAT ARE THE SIGNS AND SYMPTOMS OF TYPE 2 DIABETES? Many people have no signs or symptoms. Symptoms can be so mild that you might not  even notice them. Some of these signs are:  Increased thirst.     Increased hunger.     Tiredness (fatigue).     Increased urination, especially at night.     Weight loss.     Blurred vision.     Sores that do not heal.     SHOULD I BE TESTED FOR DIABETES?  Anyone 45 years or older, especially if overweight, should consider getting tested.   If you are younger than 45, overweight, and have one or more of the risk factors, you should consider getting tested.  SCREENING FOR TYPE 2 DIABETES  Fasting blood glucose (FBS). Usually, 2 are done.   FBS 101-125 mg/dl is considered pre-diabetes.   FBS 126 mg/dl or greater is considered diabetes.   2 hour Oral Glucose Tolerance Test (OGTT). This test is preformed by first having you not eat or drink for several hours. You are then given something sweet to drink and your blood glucose is measured fasting, at one hour and 2 hours. This test tells how well you are able to handle sugars or carbohydrates.   Fasting: 60-100 mg/dl.   1 hour: less than 200 mg/dl.   2 hours: less than 140 mg/dl.   A1c -A1c is a blood glucose test that gives and average of your blood glucose over 3 months. It is the accepted method  to use to diagnose diabetes.   A1c 5.7-6.4% is considered pre-diabetes.   A1c 6.5% or greater is considered diabetes.  WHAT DOES IT MEAN TO HAVE PRE-DIABETES? Pre-diabetes means you are at risk for getting type 2 diabetes. Your blood glucose is higher than normal, but not yet high enough to diagnose diabetes. The good news is, if you have pre-diabetes you can reduce the risk of getting diabetes and even return to normal blood glucose levels. With modest weight loss and moderate physical activity, you can delay or prevent type 2 diabetes.   WHAT CAN I DO ABOUT MY RISKS? You cannot do anything about race, age or family history, but you can lower your chances of getting diabetes. You can:    Exercise regularly and be active.   Reduce  fat and calorie intake.   Make wise food choices as much as you can.   Reduce your intake of salt and alcohol.   Maintain a reasonable weight.   Keep blood pressure in an acceptable range. Take medication if needed.   Not smoke.   Maintain an acceptable cholesterol level (HDL, LDL, Triglycerides). Take medication if needed.  DOING MY PART: GETTING STARTED Making big changes in your life is hard, especially if you are faced with more than one change. You can make it easier by taking these steps:  Make a plan to change behavior.   Decide exactly what you will do and when you will do it.   Plan what you need to get ready.   Think about what might prevent you from reaching your goals.   Find family and friends who will support and encourage you.   Decide how you will reward yourself when you do what you have planned.   Your doctor, dietitian, or counselor can help you make a plan.  HERE ARE SOME OF THE AREAS YOU MAY WISH TO CHANGE TO REDUCE YOUR RISK OF DIABETES. If you are overweight or obese, choose sensible ways to get in shape. Even small amounts of weight loss, like 5-10 pounds, can help reduce the effects of insulin resistance and help blood glucose control. Diet  Avoid crash diets. Instead, eat less of the foods you usually have. Limit the amount of fat you eat.   Increase your physical activity. Aim for at least 30 minutes of exercise most days of the week.   Set a reasonable weight-loss goal, such as losing 1 pound a week. Aim for a long-term goal of losing 5-7% of your total body weight.   Make wise food choices most of the time.   What you eat has a big impact on your health. By making wise food choices, you can help control your body weight, blood pressure, and cholesterol.   Take a hard look at the serving sizes of the foods you eat. Reduce serving sizes of meat, desserts, and foods high in fat. Increase your intake of fruits and vegetables.   Limit your fat  intake to about 25% of your total calories. For example, if your food choices add up to about 2,000 calories a day, try to eat no more than 56 grams of fat. Your caregiver or a dietitian can help you figure out how much fat to have. You can check food labels for fat content too.   You may also want to reduce the number of calories you have each day.   Keep a food log. Write down what you eat, how much you eat, and anything else  that helps keep you on track.   When you meet your goal, reward yourself with a nonfood item or activity.  Exercise  Be physically active every day.   Keep and exercise log. Write down what exercise you did, for how long, and anything else that keeps you on track.   Regular exercise (like brisk walking) tackles several risk factors at once. It helps you lose weight, it keeps your cholesterol and blood pressure under control, and it helps your body use insulin. People who are physically active for 30 minutes a day, 5 days a week, reduced their risk of type 2 diabetes. If you are not very active, you should start slowly at first. Talk with your caregiver first about what kinds of exercise would be safe for you. Make a plan to increase your activity level with the goal of being active for at least 30 minutes a day, most days of the week.   Choose activities you enjoy. Here are some ways to work extra activity into your daily routine:   Take the stairs rather than an elevator or escalator.   Park at the far end of the lot and walk.   Get off the bus a few stops early and walk the rest of the way.   Walk or bicycle instead of drive whenever you can.  TAKE YOUR PRESCRIBED MEDICATIONS Some people need medication to help control their blood pressure or cholesterol levels. If you do, take your medicines as directed. Ask your caregiver whether there are any medicines you can take to prevent type 2 diabetes. Document Released: 09/07/2003 Document Re-Released:  07/02/2009 [facility] Patient Information 2011 [place], [place].

## 2011-06-01 ENCOUNTER — Encounter: Payer: Self-pay | Admitting: Internal Medicine

## 2011-06-01 ENCOUNTER — Telehealth: Payer: Self-pay | Admitting: Internal Medicine

## 2011-06-01 NOTE — Telephone Encounter (Signed)
Spoke with pt - appt made to dicuss

## 2011-06-01 NOTE — Telephone Encounter (Signed)
Pt is aware waiting on doc to reply

## 2011-06-01 NOTE — Telephone Encounter (Signed)
Please advise 

## 2011-06-01 NOTE — Progress Notes (Signed)
  Subjective:    Patient ID: Javier [name], male    DOB: 1951/07/03, 60 y.o.   MRN: [id number]  HPI  60 year old patient who is seen today for followup. He has a history of BPH type 2 diabetes and hypertension. His medical regimen reviewed. He feels quite well.  He is seen today for followup of his hypertension. Laboratory studies recently refilled a random blood sugar in excess of 200. He was seen today to discuss diabetes and to initiate treatment   Review of Systems  Constitutional: Negative for fever, chills, appetite change and fatigue.  HENT: Negative for hearing loss, ear pain, congestion, sore throat, trouble swallowing, neck stiffness, dental problem, voice change and tinnitus.   Eyes: Negative for pain, discharge and visual disturbance.  Respiratory: Negative for cough, chest tightness, wheezing and stridor.   Cardiovascular: Negative for chest pain, palpitations and leg swelling.  Gastrointestinal: Negative for nausea, vomiting, abdominal pain, diarrhea, constipation, blood in stool and abdominal distention.  Genitourinary: Negative for urgency, hematuria, flank pain, discharge, difficulty urinating and genital sores.  Musculoskeletal: Negative for myalgias, back pain, joint swelling, arthralgias and gait problem.  Skin: Negative for rash.  Neurological: Negative for dizziness, syncope, speech difficulty, weakness, numbness and headaches.  Hematological: Negative for adenopathy. Does not bruise/bleed easily.  Psychiatric/Behavioral: Negative for behavioral problems and dysphoric mood. The patient is not nervous/anxious.        Objective:   Physical Exam  Constitutional: He is oriented to person, place, and time. He appears well-developed.  HENT:  Head: Normocephalic.  Right Ear: External ear normal.  Left Ear: External ear normal.  Eyes: Conjunctivae and EOM are normal.  Neck: Normal range of motion.  Cardiovascular: Normal rate and normal heart sounds.   Pulmonary/Chest:  Breath sounds normal.  Abdominal: Bowel sounds are normal.  Musculoskeletal: Normal range of motion. He exhibits no edema and no tenderness.  Neurological: He is alert and oriented to person, place, and time.  Psychiatric: He has a normal mood and affect. His behavior is normal.          Assessment & Plan:    Hypertension. Well controlled. We'll continue present regimen. We'll check a laboratory update. Return office visit in 3 months or as needed Diabetes mellitus. The patient was given information concerning a diabetic management including diet and lifestyle issues. He will be placed on metformin 5 mg daily. A hemoglobin A1c will be reviewed

## 2011-06-01 NOTE — Telephone Encounter (Signed)
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 He just had his physical, and everything looks good. However, his work is concerned about the clonazepam. They will clear him, but want Dr. Charm Rings office to fax them something stating that he is ok work while taking this med. Please fax to: [phone]. This is at [facility] on [address] Please fax it ASAP or call pt on cell if any problem. He needs this fax to also say he is good to drive while taking this pill. Thanks!

## 2011-06-01 NOTE — Telephone Encounter (Signed)
The physician who did the DOT physical must  clear this patient for driving;  one option would be to discontinue the clonazepam since this seems to be an issue for work

## 2011-06-02 ENCOUNTER — Ambulatory Visit (INDEPENDENT_AMBULATORY_CARE_PROVIDER_SITE_OTHER): Payer: BC Managed Care – PPO | Admitting: Internal Medicine

## 2011-06-02 ENCOUNTER — Encounter: Payer: Self-pay | Admitting: Internal Medicine

## 2011-06-02 DIAGNOSIS — F419 Anxiety disorder, unspecified: Secondary | ICD-10-CM | POA: Insufficient documentation

## 2011-06-02 DIAGNOSIS — E119 Type 2 diabetes mellitus without complications: Secondary | ICD-10-CM

## 2011-06-02 DIAGNOSIS — F411 Generalized anxiety disorder: Secondary | ICD-10-CM

## 2011-06-02 LAB — HM DIABETES FOOT EXAM

## 2011-06-02 NOTE — Patient Instructions (Signed)
Limit your sodium (Salt) intake    It is important that you exercise regularly, at least 20 minutes 3 to 4 times per week.  If you develop chest pain or shortness of breath seek  medical attention.   Please check your hemoglobin A1c every 3 months  Attempt to decrease Klonopin to once at bedtime only  Diabetic teaching as discussed

## 2011-06-02 NOTE — Progress Notes (Signed)
  Subjective:    Patient ID: Javier [name], male    DOB: [birth year], 60 y.o.   MRN: [id number]  HPI 60 year old patient who is seen today for followup. He had a recent DOT physical and presently is on clonazepam for anxiety and insomnia. This has been determined this benefit. He requires a letter Stating that it is safe for him to return to work. He does work for [employer] with [employer] but will not be driving.  His anxiety is much improved He has diabetes new onset and has done well with his medicines a fasting blood sugar 1:30.   Review of Systems  Psychiatric/Behavioral: The patient is nervous/anxious.        Objective:   Physical Exam  Constitutional: He appears well-developed and well-nourished. No distress.       Blood pressure 126/80          Assessment & Plan:   Diabetes mellitus. We'll set up for diabetic counseling Anxiety disorder. Much improved;  we'll write a letter on his behalf

## 2011-06-05 ENCOUNTER — Ambulatory Visit: Payer: BC Managed Care – PPO | Admitting: Internal Medicine

## 2011-06-05 ENCOUNTER — Encounter: Payer: Self-pay | Admitting: Internal Medicine

## 2011-06-05 ENCOUNTER — Ambulatory Visit (INDEPENDENT_AMBULATORY_CARE_PROVIDER_SITE_OTHER): Payer: BC Managed Care – PPO | Admitting: Internal Medicine

## 2011-06-05 DIAGNOSIS — I10 Essential (primary) hypertension: Secondary | ICD-10-CM

## 2011-06-05 DIAGNOSIS — E876 Hypokalemia: Secondary | ICD-10-CM

## 2011-06-05 DIAGNOSIS — F419 Anxiety disorder, unspecified: Secondary | ICD-10-CM

## 2011-06-05 DIAGNOSIS — F411 Generalized anxiety disorder: Secondary | ICD-10-CM

## 2011-06-05 DIAGNOSIS — E119 Type 2 diabetes mellitus without complications: Secondary | ICD-10-CM

## 2011-06-05 LAB — BASIC METABOLIC PANEL
CO2: 29 mEq/L (ref 19–32)
Calcium: 9.4 mg/dL (ref 8.4–10.5)
Creatinine, Ser: 0.9 mg/dL (ref 0.4–1.5)
GFR: 116.47 mL/min (ref >60.00)
Sodium: 138 mEq/L (ref 135–145)

## 2011-06-05 LAB — HEMOGLOBIN A1C: Hgb A1c MFr Bld: 5.8 % (ref 4.6–6.5)

## 2011-06-05 MED ORDER — OLMESARTAN MEDOXOMIL 40 MG PO TABS: 40.0000 mg | ORAL_TABLET | Freq: Every day | ORAL | Status: DC

## 2011-06-05 MED ORDER — SPIRONOLACTONE 25 MG PO TABS: 25.0000 mg | ORAL_TABLET | Freq: Every day | ORAL | Status: DC

## 2011-06-05 NOTE — Patient Instructions (Signed)
Behavioral  health referral as discussed Nutritional referral as discussed  Limit your sodium (Salt) intake  Okay to check blood sugars weekly only    It is important that you exercise regularly, at least 20 minutes 3 to 4 times per week.  If you develop chest pain or shortness of breath seek  medical attention.

## 2011-06-05 NOTE — Progress Notes (Signed)
  Subjective:    Patient ID: Javier [name], male    DOB: 1951-08-31, 60 y.o.   MRN: [id number]  HPI   60 year old patient who is seen today for followup. He was seen 3 days ago and has recently been placed on metformin therapy he states that he is sleeping poorly through the night and is checking blood sugars multiple times but sugars are generally less than 100. He felt to have a repeat chemistries and hemoglobin A1c 3 days ago. He has been referred to behavioral health for follow up of his anxiety. Remains on Klonopin 0.5 mg twice daily he has treated hypertension. Blood pressure regimen includes Benicar HCT and has been noted to have recent hypokalemia. Presently he is on potassium supplements. He is accompanied by a sister and brother-in-law today    Review of Systems  Psychiatric/Behavioral: Positive for sleep disturbance and dysphoric mood. The patient is nervous/anxious.        Objective:   Physical Exam  Constitutional: He appears well-developed and well-nourished. No distress.       130/78  Psychiatric: He has a normal mood and affect. His behavior is normal. Judgment and thought content normal.          Assessment & Plan:   Anxiety disorder. Will refer to psychiatry Hypertension well controlled Hypokalemia Will change to Benicar HCT to plain Benicar. We'll continue on the Aldactone. Will discontinue potassium supplement. We'll check chemistries today Diabetes mellitus. We'll check a hemoglobin A1c  Recheck 4 weeks

## 2011-06-07 ENCOUNTER — Telehealth: Payer: Self-pay | Admitting: *Deleted

## 2011-06-07 NOTE — Telephone Encounter (Signed)
Continue checking blood pressures should for the next 2 days and call back Friday and reports the blood pressure readings

## 2011-06-07 NOTE — Telephone Encounter (Signed)
Pt. States that since the changes were made in his BP regimen, his numbers are running 141/97, 137/84, 136/89, 154/90, and is concerned they may be too high.

## 2011-06-07 NOTE — Telephone Encounter (Signed)
Pt. Notified of Dr. Charm Rings recommendatons.

## 2011-06-09 ENCOUNTER — Encounter: Payer: Self-pay | Admitting: Family Medicine

## 2011-06-09 ENCOUNTER — Telehealth: Payer: Self-pay | Admitting: Internal Medicine

## 2011-06-09 ENCOUNTER — Ambulatory Visit: Payer: BC Managed Care – PPO | Admitting: Internal Medicine

## 2011-06-09 ENCOUNTER — Ambulatory Visit (INDEPENDENT_AMBULATORY_CARE_PROVIDER_SITE_OTHER): Payer: BC Managed Care – PPO | Admitting: Family Medicine

## 2011-06-09 DIAGNOSIS — I10 Essential (primary) hypertension: Secondary | ICD-10-CM

## 2011-06-09 DIAGNOSIS — E119 Type 2 diabetes mellitus without complications: Secondary | ICD-10-CM

## 2011-06-09 MED ORDER — POTASSIUM CHLORIDE CRYS ER 20 MEQ PO TBCR: 20.0000 meq | EXTENDED_RELEASE_TABLET | Freq: Every day | ORAL | Status: DC

## 2011-06-09 MED ORDER — OLMESARTAN MEDOXOMIL-HCTZ 40-25 MG PO TABS: 1.0000 | ORAL_TABLET | Freq: Every day | ORAL | Status: DC

## 2011-06-09 NOTE — Telephone Encounter (Signed)
Ok to resume prior meds

## 2011-06-09 NOTE — Telephone Encounter (Signed)
Please advise since you saw him today

## 2011-06-09 NOTE — Telephone Encounter (Signed)
Attempt to call pt - ans mach -left instructions about med - [facility] klor con. Should have sular. KIK

## 2011-06-09 NOTE — Progress Notes (Signed)
  Subjective:    Patient ID: Javier [name], male    DOB: Mar 06, 1951, 60 y.o.   MRN: [id number]  HPI Patient worked in to be seen with concerns of elevated blood pressure and blood sugar. Last night had blood sugar 145 and several minutes later checked it was 173. Just had A1c a few days ago 5.8%. Takes metformin 500 mg daily. No dietary changes. Patient also concerned because of elevated blood pressure on home cuff yesterday 170/99.  Recently had hypokalemia and HCTZ discontinued and patient started spironolactone. Remains on plain Benicar, Sular, and metformin. He also has history of chronic anxiety and clonazepam. He does not exercise. Denies any recent dizziness or headaches. No chest pains.   Review of Systems  Constitutional: Negative for fever, chills and fatigue.  Eyes: Negative for visual disturbance.  Cardiovascular: Negative for chest pain, palpitations and leg swelling.  Gastrointestinal: Negative for abdominal pain.  Genitourinary: Negative for dysuria.  Neurological: Negative for dizziness and headaches.       Objective:   Physical Exam  Constitutional: He appears well-developed and well-nourished.  Neck: Neck supple. No thyromegaly present.  Cardiovascular: Normal rate and regular rhythm.   Pulmonary/Chest: Effort normal and breath sounds normal. No respiratory distress. He has no wheezes. He has no rales.  Musculoskeletal: He exhibits no edema.          Assessment & Plan:  #1 hypertension. Slightly elevated today at 158/78. By review of home blood pressures mostly well controlled. Recommended weight loss and reassess with primary in 2 weeks. #2 type 2 diabetes. Recent A1c 5.8%.  Work on weight loss and followup with primary

## 2011-06-09 NOTE — Telephone Encounter (Signed)
Pt called and was in to see Dr Caryl Never today and had a bp reading to 170 over 99 at 6:10am. Pt is req to change back to nisoldipine (SULAR) 34 MG 24 hr tablet and olmesartan (BENICAR) and HCTZ 40-25 MG tablet and potassium chloride SA (K-DUR,KLOR-CON) 20 MEQ tablet? Pt wants to start back on these tomorrow.

## 2011-06-09 NOTE — Telephone Encounter (Signed)
18 th    141/97, 137/84, 136/89, 131/85, 131/71 19th      154/90, 154/83, 141/92, 121/64, 141/62 20          150/56, 167/93, 162/82, 132/84, 136/84, 130/72, 157/86, 138/70, 123/87,  21        143/91, 119/74, 139/82, 170/99      HR  99 80's on average Heart Rate.

## 2011-06-09 NOTE — Patient Instructions (Signed)
Work on weight loss and regular walking for exercise and continue to monitor blood sugars and blood pressure.

## 2011-06-12 ENCOUNTER — Telehealth: Payer: Self-pay | Admitting: Internal Medicine

## 2011-06-12 DIAGNOSIS — I10 Essential (primary) hypertension: Secondary | ICD-10-CM

## 2011-06-12 NOTE — Telephone Encounter (Signed)
Requesting a referral to the Nutrition center. Has Diabetes. Thanks.

## 2011-06-12 NOTE — Telephone Encounter (Signed)
Order placed

## 2011-06-12 NOTE — Telephone Encounter (Signed)
Please refer

## 2011-06-12 NOTE — Telephone Encounter (Signed)
Please advise 

## 2011-07-03 ENCOUNTER — Telehealth: Payer: Self-pay | Admitting: *Deleted

## 2011-07-03 ENCOUNTER — Other Ambulatory Visit (INDEPENDENT_AMBULATORY_CARE_PROVIDER_SITE_OTHER): Payer: BC Managed Care – PPO

## 2011-07-03 DIAGNOSIS — I10 Essential (primary) hypertension: Secondary | ICD-10-CM

## 2011-07-03 LAB — BASIC METABOLIC PANEL
BUN: 16 mg/dL (ref 6–23)
GFR: 110.49 mL/min (ref >60.00)
Potassium: 3.8 mEq/L (ref 3.5–5.1)
Sodium: 141 mEq/L (ref 135–145)

## 2011-07-03 NOTE — Telephone Encounter (Signed)
Referral was placed 9/24 - please advise

## 2011-07-03 NOTE — Telephone Encounter (Signed)
Pt is calling back asking about his referral to Nutrition Dept re:  DM

## 2011-07-10 ENCOUNTER — Ambulatory Visit (INDEPENDENT_AMBULATORY_CARE_PROVIDER_SITE_OTHER): Payer: BC Managed Care – PPO | Admitting: Internal Medicine

## 2011-07-10 ENCOUNTER — Encounter: Payer: Self-pay | Admitting: Internal Medicine

## 2011-07-10 DIAGNOSIS — E119 Type 2 diabetes mellitus without complications: Secondary | ICD-10-CM

## 2011-07-10 LAB — GLUCOSE, POCT (MANUAL RESULT ENTRY): POC Glucose: 106

## 2011-07-10 NOTE — Progress Notes (Signed)
  Subjective:    Patient ID: Javier [name], male    DOB: October 01, 1950, 60 y.o.   MRN: [id number]  HPI  Wt Readings from Last 3 Encounters:  07/10/11 222 lb (100.699 kg)  06/09/11 232 lb (105.235 kg)  06/05/11 231 lb (104.781 kg)    Review of Systems     Objective:   Physical Exam        Assessment & Plan:

## 2011-07-10 NOTE — Patient Instructions (Signed)
It is important that you exercise regularly, at least 20 minutes 3 to 4 times per week.  If you develop chest pain or shortness of breath seek  medical attention.  You need to lose weight.  Consider a lower calorie diet and regular exercise.   Please check your hemoglobin A1c every 3 months   

## 2011-07-10 NOTE — Progress Notes (Signed)
  Subjective:    Patient ID: Javier [name], male    DOB: 1951/07/27, 60 y.o.   MRN: [id number]  HPI  60 year old patient who is seen today for followup of hypertension and diabetes. He has done remarkably well blood pressure and blood sugar readings at home have been quite well controlled. He has a history of anxiety also has improved. He is exercising regularly and has lost 10 pounds over the past month. A hemoglobin A1c one month ago was 5.6    Review of Systems  Constitutional: Negative for fever, chills, appetite change and fatigue.  HENT: Negative for hearing loss, ear pain, congestion, sore throat, trouble swallowing, neck stiffness, dental problem, voice change and tinnitus.   Eyes: Negative for pain, discharge and visual disturbance.  Respiratory: Negative for cough, chest tightness, wheezing and stridor.   Cardiovascular: Negative for chest pain, palpitations and leg swelling.  Gastrointestinal: Negative for nausea, vomiting, abdominal pain, diarrhea, constipation, blood in stool and abdominal distention.  Genitourinary: Negative for urgency, hematuria, flank pain, discharge, difficulty urinating and genital sores.  Musculoskeletal: Negative for myalgias, back pain, joint swelling, arthralgias and gait problem.  Skin: Negative for rash.  Neurological: Negative for dizziness, syncope, speech difficulty, weakness, numbness and headaches.  Hematological: Negative for adenopathy. Does not bruise/bleed easily.  Psychiatric/Behavioral: Negative for behavioral problems and dysphoric mood. The patient is not nervous/anxious.        Objective:   Physical Exam  Constitutional: He is oriented to person, place, and time. He appears well-developed.  HENT:  Head: Normocephalic.  Right Ear: External ear normal.  Left Ear: External ear normal.  Eyes: Conjunctivae and EOM are normal.  Neck: Normal range of motion.  Cardiovascular: Normal rate and normal heart sounds.   Pulmonary/Chest:  Breath sounds normal.  Abdominal: Bowel sounds are normal.  Musculoskeletal: Normal range of motion. He exhibits no edema and no tenderness.  Neurological: He is alert and oriented to person, place, and time.  Psychiatric: He has a normal mood and affect. His behavior is normal.          Assessment & Plan:    Hypertension well controlled Diabetes mellitus type control  We'll continue his present regimen. We'll recheck in 3 months and hemoglobin A1c reviewed at that time.

## 2011-08-07 ENCOUNTER — Telehealth: Payer: Self-pay | Admitting: *Deleted

## 2011-08-07 NOTE — Telephone Encounter (Signed)
Pt is asking for a referral to a Diabetic nutrionist ASAP. Has been waiting months.

## 2011-08-07 NOTE — Telephone Encounter (Signed)
Order place again - please let me know if this is not the correct order

## 2011-08-14 ENCOUNTER — Encounter: Payer: BC Managed Care – PPO | Attending: Internal Medicine | Admitting: *Deleted

## 2011-08-14 ENCOUNTER — Encounter: Payer: Self-pay | Admitting: *Deleted

## 2011-08-14 DIAGNOSIS — E119 Type 2 diabetes mellitus without complications: Secondary | ICD-10-CM | POA: Insufficient documentation

## 2011-08-14 DIAGNOSIS — Z713 Dietary counseling and surveillance: Secondary | ICD-10-CM | POA: Insufficient documentation

## 2011-08-14 NOTE — Patient Instructions (Addendum)
Goals:  Follow Diabetes Meal Plan as instructed (yellow card).  Eat 3 meals and 2-3 snacks, or every 3-5 hrs  Add lean protein foods to meals/snacks  Aim for 30 min or more of physical activity daily

## 2011-08-14 NOTE — Progress Notes (Signed)
Medical Nutrition Therapy:  Appt start time: 1200 end time:  1300.  Assessment:  T2DM, new onset.  Pt here for assessment with A1c of 5.8% and recent dx of T2DM (05/2011).  Reports wt hx of 232 lbs at time of dx, and today weighs 210 lbs. Pt has been counting CHO on his own using book he received from MD's nurse. Averages ~75 g CHO/meal and has eliminated sodas and juice as well as pizza and other high fat foods.  Reports eating mostly fruit and vegetables with protein at meals. History of skipping meals, but has not missed a one since dx. Does not salt foods.  Would like to lose additional ~40 lbs.  Reports only checking BG 1 time every 3 weeks per MD.  Last FBG was 83 mg/dL a few weeks ago.    Lab Results  Component Value Date   HGBA1C 5.8 06/05/2011   MEDICATIONS: See medication list; reconciled with patient.   DIETARY INTAKE:  Usual eating pattern includes 3 meals and 0-1 snacks per day.  24-hr recall:  B ( AM): 1 c Cheerios w/ 1% milk, 1 med banana, 2 [place] sausage links, apple  Snk ( AM): almonds  *States lunch and dinner also ~75g CHO; eats few snacks of nuts, fruit, etc.  Drinks only water since diagnosis.  Usual physical activity: None since last month d/t aggravation of knee (had surgery last year) from walking daily.   Estimated energy needs: 1500-1600 calories 165-180 g carbohydrates 110-120 g protein 40-45 g fat  Progress Towards Goal(s):  In progress.   Nutritional Diagnosis:  [place]-2.1 Impaired nutrient utilization related to glucose metabolism as evidenced by a new diagnosis of T2DM.    Intervention/Goals:  Follow Diabetes Meal Plan as instructed (yellow card).  Eat 3 meals and 2-3 snacks, or every 3-5 hrs  Add lean protein foods to meals/snacks  Aim for 30 min or more of physical activity daily  Handouts given during visit include:  Living Well with Diabetes - [employer] and 45g, 60g meal plans  Quick and Easy Diabetic Cooking    Monitoring/Evaluation:  Dietary intake, exercise, A1c and BG (as available), and body weight in 3 months.

## 2011-10-08 ENCOUNTER — Other Ambulatory Visit: Payer: Self-pay | Admitting: Internal Medicine

## 2011-10-09 ENCOUNTER — Encounter: Payer: Self-pay | Admitting: Internal Medicine

## 2011-10-09 ENCOUNTER — Ambulatory Visit (INDEPENDENT_AMBULATORY_CARE_PROVIDER_SITE_OTHER): Payer: BC Managed Care – PPO | Admitting: Internal Medicine

## 2011-10-09 DIAGNOSIS — I10 Essential (primary) hypertension: Secondary | ICD-10-CM

## 2011-10-09 DIAGNOSIS — E119 Type 2 diabetes mellitus without complications: Secondary | ICD-10-CM

## 2011-10-09 DIAGNOSIS — F419 Anxiety disorder, unspecified: Secondary | ICD-10-CM

## 2011-10-09 DIAGNOSIS — F411 Generalized anxiety disorder: Secondary | ICD-10-CM

## 2011-10-09 LAB — GLUCOSE, POCT (MANUAL RESULT ENTRY): POC Glucose: 161

## 2011-10-09 MED ORDER — CLONAZEPAM 0.5 MG PO TABS: 0.5000 mg | ORAL_TABLET | Freq: Two times a day (BID) | ORAL | Status: DC

## 2011-10-09 NOTE — Progress Notes (Signed)
  Subjective:    Patient ID: Javier [name], male    DOB: 30-Oct-1950, 61 y.o.   MRN: [id number]  HPI  61 year old patient who is seen today for followup of his type 2 diabetes he has done quite well with lifestyle management and is controlled on low-dose metformin. 4 months ago his hemoglobin A1c 6.3. He has lost a considerable amount of weight. He has treated hypertension which has been stable he remains on combination therapy. He has a history of hypokalemia and is also on supplemental potassium followup potassium 4 months ago was normal. He has gastroesophageal disease which has been stable. Remains on AcipHex 20 mg daily. He has a history of anxiety. Presently he has done quite well on Klonopin on a twice a day regimen he has not followed through with counseling but feels that he is doing quite well on this regimen    Review of Systems  Constitutional: Negative for fever, chills, appetite change and fatigue.  HENT: Negative for hearing loss, ear pain, congestion, sore throat, trouble swallowing, neck stiffness, dental problem, voice change and tinnitus.   Eyes: Negative for pain, discharge and visual disturbance.  Respiratory: Negative for cough, chest tightness, wheezing and stridor.   Cardiovascular: Negative for chest pain, palpitations and leg swelling.  Gastrointestinal: Negative for nausea, vomiting, abdominal pain, diarrhea, constipation, blood in stool and abdominal distention.  Genitourinary: Negative for urgency, hematuria, flank pain, discharge, difficulty urinating and genital sores.  Musculoskeletal: Negative for myalgias, back pain, joint swelling, arthralgias (knee pain limiting walking) and gait problem.  Skin: Negative for rash.  Neurological: Negative for dizziness, syncope, speech difficulty, weakness, numbness and headaches.  Hematological: Negative for adenopathy. Does not bruise/bleed easily.  Psychiatric/Behavioral: Negative for behavioral problems and dysphoric mood.  The patient is nervous/anxious.        Objective:   Physical Exam  Constitutional: He is oriented to person, place, and time. He appears well-developed.  HENT:  Head: Normocephalic.  Right Ear: External ear normal.  Left Ear: External ear normal.  Eyes: Conjunctivae and EOM are normal.  Neck: Normal range of motion.  Cardiovascular: Normal rate and normal heart sounds.   Pulmonary/Chest: Breath sounds normal.  Abdominal: Bowel sounds are normal.  Musculoskeletal: Normal range of motion. He exhibits no edema and no tenderness.  Neurological: He is alert and oriented to person, place, and time.  Psychiatric: He has a normal mood and affect. His behavior is normal.          Assessment & Plan:    Diabetes mellitus. Appears to be under tight control. We'll check a hemoglobin A1c Hypertension well controlled we'll continue combination therapy as well as potassium supplementation Anxiety disorder. We'll try a dose reduction of Klonopin to one daily at bedtime if he does not tolerate this well will increase to his present twice a day regimen Gastroesophageal reflux disease stable

## 2011-10-09 NOTE — Patient Instructions (Signed)
Please check your hemoglobin A1c every 3 months  Limit your sodium (Salt) intake  Return in 4 months for follow-up  

## 2011-10-09 NOTE — Telephone Encounter (Signed)
Was given printed rx at appt today - erquest denied

## 2011-10-11 ENCOUNTER — Telehealth: Payer: Self-pay | Admitting: *Deleted

## 2011-10-11 MED ORDER — ZOLPIDEM TARTRATE 10 MG PO TABS: 10.0000 mg | ORAL_TABLET | Freq: Every evening | ORAL | Status: DC | PRN

## 2011-10-11 NOTE — Telephone Encounter (Signed)
Pt is calling to ask Dr Kirtland Bouchard to call in RX for sleep from his last office visit.

## 2011-10-11 NOTE — Telephone Encounter (Signed)
Generic ambien 10 mg #30 one HS prn

## 2011-10-13 ENCOUNTER — Emergency Department (HOSPITAL_COMMUNITY)
Admission: EM | Admit: 2011-10-13 | Discharge: 2011-10-13 | Disposition: A | Discharge status: Home / Self Care | Payer: BC Managed Care – PPO | Attending: Emergency Medicine | Admitting: Emergency Medicine

## 2011-10-13 ENCOUNTER — Encounter (HOSPITAL_COMMUNITY): Payer: Self-pay

## 2011-10-13 ENCOUNTER — Emergency Department (HOSPITAL_COMMUNITY): Payer: BC Managed Care – PPO

## 2011-10-13 ENCOUNTER — Other Ambulatory Visit: Payer: Self-pay

## 2011-10-13 DIAGNOSIS — E119 Type 2 diabetes mellitus without complications: Secondary | ICD-10-CM | POA: Insufficient documentation

## 2011-10-13 DIAGNOSIS — K219 Gastro-esophageal reflux disease without esophagitis: Secondary | ICD-10-CM | POA: Insufficient documentation

## 2011-10-13 DIAGNOSIS — I951 Orthostatic hypotension: Secondary | ICD-10-CM | POA: Insufficient documentation

## 2011-10-13 DIAGNOSIS — Z79899 Other long term (current) drug therapy: Secondary | ICD-10-CM | POA: Insufficient documentation

## 2011-10-13 DIAGNOSIS — N400 Enlarged prostate without lower urinary tract symptoms: Secondary | ICD-10-CM | POA: Insufficient documentation

## 2011-10-13 DIAGNOSIS — I10 Essential (primary) hypertension: Secondary | ICD-10-CM | POA: Insufficient documentation

## 2011-10-13 DIAGNOSIS — E669 Obesity, unspecified: Secondary | ICD-10-CM | POA: Insufficient documentation

## 2011-10-13 DIAGNOSIS — R51 Headache: Secondary | ICD-10-CM | POA: Insufficient documentation

## 2011-10-13 LAB — DIFFERENTIAL
Basophils Absolute: 0 10*3/uL (ref 0.0–0.1)
Basophils Relative: 1 % (ref 0–1)
Eosinophils Absolute: 0 10*3/uL (ref 0.0–0.7)
Eosinophils Relative: 1 % (ref 0–5)
Monocytes Absolute: 0.4 10*3/uL (ref 0.1–1.0)
Monocytes Relative: 9 % (ref 3–12)
Neutro Abs: 2.9 10*3/uL (ref 1.7–7.7)

## 2011-10-13 LAB — CARDIAC PANEL(CRET KIN+CKTOT+MB+TROPI)
CK, MB: 4.7 ng/mL — ABNORMAL HIGH (ref 0.3–4.0)
CK, MB: 4.3 ng/mL — ABNORMAL HIGH (ref 0.3–4.0)
Total CK: 296 U/L — ABNORMAL HIGH (ref 7–232)
Total CK: 252 U/L — ABNORMAL HIGH (ref 7–232)
Troponin I: <0.3 ng/mL (ref <0.30)
Troponin I: <0.3 ng/mL (ref <0.30)

## 2011-10-13 LAB — URINE MICROSCOPIC-ADD ON

## 2011-10-13 LAB — CBC
HCT: 41.6 % (ref 39.0–52.0)
Hemoglobin: 14.3 g/dL (ref 13.0–17.0)
MCH: 29.1 pg (ref 26.0–34.0)
MCHC: 34.4 g/dL (ref 30.0–36.0)
MCV: 84.7 fL (ref 78.0–100.0)
RDW: 14.6 % (ref 11.5–15.5)

## 2011-10-13 LAB — COMPREHENSIVE METABOLIC PANEL
AST: 31 U/L (ref 0–37)
Albumin: 3.7 g/dL (ref 3.5–5.2)
BUN: 14 mg/dL (ref 6–23)
Calcium: 9.3 mg/dL (ref 8.4–10.5)
Chloride: 100 mEq/L (ref 96–112)
Creatinine, Ser: 0.61 mg/dL (ref 0.50–1.35)
Total Bilirubin: 0.5 mg/dL (ref 0.3–1.2)

## 2011-10-13 LAB — URINALYSIS, ROUTINE W REFLEX MICROSCOPIC
Glucose, UA: NEGATIVE mg/dL
Ketones, ur: NEGATIVE mg/dL
Leukocytes, UA: NEGATIVE
Protein, ur: NEGATIVE mg/dL
Urobilinogen, UA: 0.2 mg/dL (ref 0.0–1.0)

## 2011-10-13 MED ORDER — SODIUM CHLORIDE 0.9 % IV BOLUS (SEPSIS)
1000.0000 mL | Freq: Once | INTRAVENOUS | Status: AC
  Administered 2011-10-13: 1000 mL via INTRAVENOUS

## 2011-10-13 NOTE — ED Notes (Signed)
Pt given meal tray and 3 cups water.

## 2011-10-13 NOTE — ED Provider Notes (Signed)
History     CSN: [id number]  Arrival date & time 10/13/11  4098   First MD Initiated Contact with Patient 10/13/11 [phone]      Chief Complaint  Patient presents with  . Near Syncope    (Consider location/radiation/quality/duration/timing/severity/associated sxs/prior treatment) HPI Comments: Patient presents from home with lightheadedness episode that started shortly after getting out of bed today. He describes it as a fuzzy feeling associated with heaviness in his right arm and abdominal bloating. He also had a loose stool. He denies any chest pain, abdominal pain or shortness of breath. He's had no nausea, vomiting, fever, cough. He states he woke up around 3:00 with a dull headache and ate some fruit because he thought his sugar was low.  He then got up around his usual time and felt lightheaded for a few minutes and had to lay back down. He is now feeling back to baseline. He denies any focal weakness, numbness, tingling. Denies any vertigo.  The history is provided by the patient.    Past Medical History  Diagnosis Date  . BENIGN PROSTATIC HYPERTROPHY 02/20/2007  . ELEVATED PROSTATE SPECIFIC ANTIGEN 02/26/2009  . 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 FASCIITIS, PLANTAR 09/30/2007  . GERD 02/20/2007  . HYPERTENSION 02/20/2007  . HYPOKALEMIA 03/02/2009  . Obesity   . Diabetes mellitus     Past Surgical History  Procedure Date  . Knee surgery     left    History reviewed. No pertinent family history.  History  Substance Use Topics  . Smoking status: Never Smoker   . Smokeless tobacco: Never Used  . Alcohol Use: No      Review of Systems  Constitutional: Positive for fatigue. Negative for fever and diaphoresis.  HENT: Negative for congestion, sore throat and rhinorrhea.   Eyes: Negative for visual disturbance.  Respiratory: Negative for cough, chest tightness and shortness of breath.   Cardiovascular: Negative for chest pain.  Gastrointestinal: Negative for nausea,  vomiting and abdominal pain.  Genitourinary: Negative for dysuria and hematuria.  Musculoskeletal: Negative for back pain.  Neurological: Positive for dizziness, light-headedness and headaches.    Allergies  Review of patient's allergies indicates no known allergies.  Home Medications   Current Outpatient Rx  Name Route Sig Dispense Refill  . METFORMIN HCL ER 500 MG PO TB24 Oral Take 1 tablet (500 mg total) by mouth daily with breakfast. 30 tablet 11  . NISOLDIPINE ER 34 MG PO TB24 Oral Take 1 tablet (34 mg total) by mouth daily. 90 tablet 6  . OLMESARTAN MEDOXOMIL-HCTZ 40-25 MG PO TABS Oral Take 1 tablet by mouth daily. 90 tablet 1  . POTASSIUM CHLORIDE CRYS ER 20 MEQ PO TBCR Oral Take 1 tablet (20 mEq total) by mouth daily. 90 tablet 1  . TADALAFIL 20 MG PO TABS Oral Take 1 tablet (20 mg total) by mouth daily as needed. 10 tablet 6    BP 160/70  Pulse 78  Temp(Src) 98.3 F (36.8 C) (Oral)  Resp 16  SpO2 98%  Physical Exam  Constitutional: He is oriented to person, place, and time. He appears well-developed and well-nourished. No distress.  HENT:  Head: Normocephalic and atraumatic.  Mouth/Throat: Oropharynx is clear and moist. No oropharyngeal exudate.  Eyes: Conjunctivae are normal. Pupils are equal, round, and reactive to light.  Neck: Normal range of motion. Neck supple.  Cardiovascular: Normal rate, regular rhythm and normal heart sounds.   Pulmonary/Chest: Effort normal and breath sounds normal. No respiratory distress.  Abdominal: Soft. There is no tenderness. There is no rebound and no guarding.  Musculoskeletal: Normal range of motion. He exhibits no edema and no tenderness.  Neurological: He is alert and oriented to person, place, and time. No cranial nerve deficit.       5 out of 5 strength throughout, no facial asymmetry, no ataxia finger to nose, no nystagmus Test of skew negative, head and pulse testing within normal limits Normal gait  Skin: Skin is warm.     ED Course  Procedures (including critical care time)  Labs Reviewed  COMPREHENSIVE METABOLIC PANEL - Abnormal; Notable for the following:    Potassium 3.4 (*)    Glucose, Bld 131 (*)    All other components within normal limits  CARDIAC PANEL(CRET KIN+CKTOT+MB+TROPI) - Abnormal; Notable for the following:    Total CK 296 (*)    CK, MB 4.7 (*)    All other components within normal limits  URINALYSIS, ROUTINE W REFLEX MICROSCOPIC - Abnormal; Notable for the following:    Hgb urine dipstick TRACE (*)    All other components within normal limits  CARDIAC PANEL(CRET KIN+CKTOT+MB+TROPI) - Abnormal; Notable for the following:    Total CK 252 (*)    CK, MB 4.3 (*)    All other components within normal limits  CBC  DIFFERENTIAL  PROTIME-INR  URINE MICROSCOPIC-ADD ON   Dg Chest 2 View  10/13/2011  *RADIOLOGY REPORT*  Clinical Data: Dizziness, history hypertension, diabetes  CHEST - 2 VIEW  Comparison: 05/08/2011  Findings: Upper-normal size of cardiac silhouette. Mediastinal contours and pulmonary vascularity normal. Lungs clear. No pleural effusion or pneumothorax. Multilevel endplate spur formation thoracic spine. Bilateral inferior spurring at the [facility] joints, which may predispose the patient to rotator cuff pathology.  IMPRESSION: No acute abnormalities.  Original Report Authenticated By: Lollie Marrow, M.D.   Ct Head Wo Contrast  10/13/2011  *RADIOLOGY REPORT*  Clinical Data: Dizziness.  Right arm heaviness.  CT HEAD WITHOUT CONTRAST  Technique:  Contiguous axial images were obtained from the base of the skull through the vertex without contrast.  Comparison: 05/08/2011.  Findings: No intracranial hemorrhage.  Prominent perivascular space versus remote lacunar infarct right basal ganglia unchanged.  No CT evidence of large acute infarct.  Small acute infarct cannot be excluded by CT.  No intracranial mass lesion detected on this unenhanced exam.  No hydrocephalus.  Mild ectasia of the  basilar artery unchanged.  Calcified cavernous segment internal carotid arteries.  Partially empty sella unchanged.  Orbital structures unremarkable.  IMPRESSION: No intracranial hemorrhage or CT evidence of large acute infarct.  Original Report Authenticated By: Fuller Canada, M.D.     1. Orthostatic hypotension       MDM  Lightheadedness upon standing from a lying position. Feeling back to baseline now no history chest pain, shortness of breath, focal neurodeficits.  Patient does have drop in BP while standing.  No further lightheadedness, dizziness, arm heaviness.  Tolerating PO.  Ambulatory without difficulty.  Delta troponin negative.  Symptoms likely component of orthostatic hypotension or hypoglycemia.  At baseline now.      Date: 10/13/2011  Rate: 95  Rhythm: normal sinus rhythm  QRS Axis: normal  Intervals: PR prolonged  ST/T Wave abnormalities: nonspecific ST/T changes  Conduction Disutrbances:none  Narrative Interpretation:   Old EKG Reviewed: unchanged    Glynn Octave, MD 10/13/11 1806

## 2011-10-13 NOTE — Discharge Instructions (Signed)
Near-Syncope °Near-syncope is sudden weakness, dizziness, or feeling like you might pass out (faint). This may occur when getting up after sitting or while standing for a long period of time. Near-syncope can be caused by a drop in blood pressure. This is a common reaction, but it may occur to a greater degree in people taking medicines to control their blood pressure. Fainting often occurs when the blood pressure or pulse is too low to provide enough blood flow to the brain to keep you conscious. Fainting and near-syncope are not usually due to serious medical problems. However, certain people should be more cautious in the event of near-syncope, including elderly patients, patients with diabetes, and patients with a history of heart conditions (especially irregular rhythms).  °CAUSES  °· Drop in blood pressure.  °· Physical pain.  °· Dehydration.  °· Heat exhaustion.  °· Emotional distress.  °· Low blood sugar.  °· Internal bleeding.  °· Heart and circulatory problems.  °· Infections.  °SYMPTOMS  °· Dizziness.  °· Feeling sick to your stomach (nauseous).  °· Nearly fainting.  °· Body numbness.  °· Turning pale.  °· Tunnel vision.  °· Weakness.  °HOME CARE INSTRUCTIONS  °· Lie down right away if you start feeling like you might faint. Breathe deeply and steadily. Wait until all the symptoms have passed. Most of these episodes last only a few minutes. You may feel tired for several hours.  °· Drink enough fluids to keep your urine clear or pale yellow.  °· If you are taking blood pressure or heart medicine, get up slowly, taking several minutes to sit and then stand. This can reduce dizziness that is caused by a drop in blood pressure.  °SEEK IMMEDIATE MEDICAL CARE IF:  °· You have a severe headache.  °· Unusual pain develops in the chest, abdomen, or back.  °· There is bleeding from the mouth or rectum, or you have black or tarry stool.  °· An irregular heartbeat or a very rapid pulse develops.  °· You have  repeated fainting or seizure-like jerking during an episode.  °· You faint when sitting or lying down.  °· You develop confusion.  °· You have difficulty walking.  °· Severe weakness develops.  °· Vision problems develop.  °MAKE SURE YOU:  °· Understand these instructions.  °· Will watch your condition.  °· Will get help right away if you are not doing well or get worse.  °Document Released: 09/04/2005 Document Revised: 05/17/2011 Document Reviewed: 10/21/2010 °[employer]® Patient Information ©2012 [employer]. °

## 2011-10-13 NOTE — ED Notes (Signed)
Pt ambulates without difficulty

## 2011-10-13 NOTE — ED Notes (Addendum)
Pt c/o waking this am w/dizziness, R arm heaviness, and abd bloating starting this am, loose stool x1 this am, denies chest pain, sob, or N/V

## 2011-10-14 ENCOUNTER — Other Ambulatory Visit: Payer: Self-pay

## 2011-10-14 ENCOUNTER — Encounter (HOSPITAL_COMMUNITY): Payer: Self-pay | Admitting: Emergency Medicine

## 2011-10-14 ENCOUNTER — Emergency Department (HOSPITAL_COMMUNITY)
Admission: EM | Admit: 2011-10-14 | Discharge: 2011-10-15 | Payer: BC Managed Care – PPO | Attending: Emergency Medicine | Admitting: Emergency Medicine

## 2011-10-14 DIAGNOSIS — E119 Type 2 diabetes mellitus without complications: Secondary | ICD-10-CM | POA: Insufficient documentation

## 2011-10-14 DIAGNOSIS — F22 Delusional disorders: Secondary | ICD-10-CM

## 2011-10-14 DIAGNOSIS — R002 Palpitations: Secondary | ICD-10-CM | POA: Insufficient documentation

## 2011-10-14 DIAGNOSIS — R000 Tachycardia, unspecified: Secondary | ICD-10-CM | POA: Insufficient documentation

## 2011-10-14 DIAGNOSIS — I10 Essential (primary) hypertension: Secondary | ICD-10-CM | POA: Insufficient documentation

## 2011-10-14 DIAGNOSIS — R0789 Other chest pain: Secondary | ICD-10-CM | POA: Insufficient documentation

## 2011-10-14 DIAGNOSIS — Z79899 Other long term (current) drug therapy: Secondary | ICD-10-CM | POA: Insufficient documentation

## 2011-10-14 DIAGNOSIS — K219 Gastro-esophageal reflux disease without esophagitis: Secondary | ICD-10-CM | POA: Insufficient documentation

## 2011-10-14 DIAGNOSIS — F341 Dysthymic disorder: Secondary | ICD-10-CM | POA: Insufficient documentation

## 2011-10-14 LAB — POCT I-STAT, CHEM 8
BUN: 12 mg/dL (ref 6–23)
BUN: 12 mg/dL (ref 6–23)
Chloride: 103 mEq/L (ref 96–112)
Chloride: 102 mEq/L (ref 96–112)
Creatinine, Ser: 0.8 mg/dL (ref 0.50–1.35)
Hemoglobin: 15.3 g/dL (ref 13.0–17.0)
Potassium: 3.3 mEq/L — ABNORMAL LOW (ref 3.5–5.1)
Potassium: 3.4 mEq/L — ABNORMAL LOW (ref 3.5–5.1)
Sodium: 141 mEq/L (ref 135–145)
Sodium: 141 mEq/L (ref 135–145)

## 2011-10-14 LAB — ETHANOL: Alcohol, Ethyl (B): <11 mg/dL (ref 0–11)

## 2011-10-14 LAB — CBC
MCH: 29.1 pg (ref 26.0–34.0)
MCV: 84.6 fL (ref 78.0–100.0)
Platelets: 252 10*3/uL (ref 150–400)
RDW: 14.6 % (ref 11.5–15.5)

## 2011-10-14 MED ORDER — POTASSIUM CHLORIDE CRYS ER 20 MEQ PO TBCR
40.0000 meq | EXTENDED_RELEASE_TABLET | Freq: Once | ORAL | Status: AC
  Administered 2011-10-14: 40 meq via ORAL
  Filled 2011-10-14: qty 2

## 2011-10-14 NOTE — ED Notes (Signed)
Patient currently resting quietly in bed; no respiratory or acute distress noted.  Patient requesting something to eat; patient given [place] sandwich and applesauce (OKed by Dr. Ethelda Chick).  Telepsych paperwork has been filled out and given to EDP.  Patient has no other questions or concerns at this time; will continue to monitor.

## 2011-10-14 NOTE — ED Notes (Signed)
Received bedside report from [place], Charity fundraiser.  Patient currently resting quietly in bed; no respiratory or acute distress noted.  Introduced self to patient and updated whiteboard in room.  Patient updated on plan of care; informed patient that we are currently waiting on the EDP to come and assess patient.  Patient has no other questions or concerns at this time; will continue to monitor.

## 2011-10-14 NOTE — ED Notes (Signed)
Patient currently resting quietly in bed; no respiratory or acute distress noted.  Patient has no questions or concerns at this time; will continue to monitor. 

## 2011-10-14 NOTE — ED Notes (Signed)
Patient currently speaking with MD through telepsych; RN at bedside to monitor.

## 2011-10-14 NOTE — ED Notes (Signed)
Received pt from home with c/o feeling like his heart is racing. Pt also reports, "I brought this plate of food and I want it tested for poison." "I don't feel safe at home." "I am depressed." Pt denies SI/HI. Pt seen here yesterday for same.

## 2011-10-14 NOTE — ED Provider Notes (Signed)
History     CSN: [id number]  Arrival date & time 10/14/11  4098   First MD Initiated Contact with Patient 10/14/11 1925      Chief Complaint  Patient presents with  . Tachycardia  . Anxiety  . Depression    (Consider location/radiation/quality/duration/timing/severity/associated sxs/prior treatment) HPI Reports that approximately 4 minute episode of racing heart today with associated chest discomfort and a tight feeling in his chest symptoms resolve spontaneously without treatment. Had similar episode yesterday which resolved spontaneously. Also reports anxiety and depression and feels that his sisters boyfriend may be trying to poison his food. Patient  Reports he is currently waiting to see a psychiatrist as arranged for him by his primary care MD, Dr Lesia Hausen. No treatment prior to coming here. Patient seen at [facility] emergency department yesterday for similar episode, treated and released. Also reports he had episode of palpitations approximately one month ago Past Medical History  Diagnosis Date  . BENIGN PROSTATIC HYPERTROPHY 02/20/2007  . ELEVATED PROSTATE SPECIFIC ANTIGEN 02/26/2009  . 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 FASCIITIS, PLANTAR 09/30/2007  . GERD 02/20/2007  . HYPERTENSION 02/20/2007  . HYPOKALEMIA 03/02/2009  . Obesity   . Diabetes mellitus     Past Surgical History  Procedure Date  . Knee surgery     left    History reviewed. No pertinent family history.  History  Substance Use Topics  . Smoking status: Never Smoker   . Smokeless tobacco: Never Used  . Alcohol Use: No      Review of Systems  Constitutional: Negative.   HENT: Negative.   Respiratory: Negative.   Cardiovascular: Positive for chest pain and palpitations.  Gastrointestinal: Negative.   Musculoskeletal: Negative.   Skin: Negative.   Neurological: Negative.   Hematological: Negative.   Psychiatric/Behavioral: Positive for decreased concentration. Negative for suicidal  ideas. The patient is nervous/anxious.        Depression; no suicidal or homicidal ideation; reports having trouble "focusing his thougths since taken off of clonazepam 5 days ago  All other systems reviewed and are negative.    Allergies  Review of patient's allergies indicates no known allergies.  Home Medications   Current Outpatient Rx  Name Route Sig Dispense Refill  . METFORMIN HCL ER 500 MG PO TB24 Oral Take 1 tablet (500 mg total) by mouth daily with breakfast. 30 tablet 11  . NISOLDIPINE ER 34 MG PO TB24 Oral Take 1 tablet (34 mg total) by mouth daily. 90 tablet 6  . OLMESARTAN MEDOXOMIL-HCTZ 40-25 MG PO TABS Oral Take 1 tablet by mouth daily. 90 tablet 1  . POTASSIUM CHLORIDE CRYS ER 20 MEQ PO TBCR Oral Take 1 tablet (20 mEq total) by mouth daily. 90 tablet 1  . TADALAFIL 20 MG PO TABS Oral Take 20 mg by mouth daily as needed. Erectile Dysfunction    . ZOLPIDEM TARTRATE 10 MG PO TABS Oral Take 10 mg by mouth at bedtime as needed. For sleep      BP 172/70  Temp(Src) 98.6 F (37 C) (Oral)  Resp 18  SpO2 98%  Physical Exam  Nursing note and vitals reviewed. Constitutional: He appears well-developed and well-nourished. No distress.  HENT:  Head: Normocephalic and atraumatic.  Eyes: Conjunctivae are normal. Pupils are equal, round, and reactive to light.  Neck: Neck supple. No tracheal deviation present. No thyromegaly present.  Cardiovascular: Normal rate and regular rhythm.   No murmur heard. Pulmonary/Chest: Effort normal and breath sounds normal.  Abdominal: Soft.  Bowel sounds are normal. He exhibits no distension. There is no tenderness.  Musculoskeletal: Normal range of motion. He exhibits no edema and no tenderness.  Neurological: He is alert. Coordination normal.       Gait normal  Skin: Skin is warm and dry. No rash noted.  Psychiatric: He has a normal mood and affect.    ED Course  Procedures (including critical care time)  Labs Reviewed - No data to  display Dg Chest 2 View  10/13/2011  *RADIOLOGY REPORT*  Clinical Data: Dizziness, history hypertension, diabetes  CHEST - 2 VIEW  Comparison: 05/08/2011  Findings: Upper-normal size of cardiac silhouette. Mediastinal contours and pulmonary vascularity normal. Lungs clear. No pleural effusion or pneumothorax. Multilevel endplate spur formation thoracic spine. Bilateral inferior spurring at the [facility] joints, which may predispose the patient to rotator cuff pathology.  IMPRESSION: No acute abnormalities.  Original Report Authenticated By: Lollie Marrow, M.D.   Ct Head Wo Contrast  10/13/2011  *RADIOLOGY REPORT*  Clinical Data: Dizziness.  Right arm heaviness.  CT HEAD WITHOUT CONTRAST  Technique:  Contiguous axial images were obtained from the base of the skull through the vertex without contrast.  Comparison: 05/08/2011.  Findings: No intracranial hemorrhage.  Prominent perivascular space versus remote lacunar infarct right basal ganglia unchanged.  No CT evidence of large acute infarct.  Small acute infarct cannot be excluded by CT.  No intracranial mass lesion detected on this unenhanced exam.  No hydrocephalus.  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  Calcified cavernous segment internal carotid arteries.  Partially empty sella unchanged.  Orbital structures unremarkable.  IMPRESSION: No intracranial hemorrhage or CT evidence of large acute infarct.  Original Report Authenticated By: Fuller Canada, M.D.     No diagnosis found.   Date: 10/14/2011  Rate: 70  Rhythm: normal sinus rhythm  QRS Axis: normal  Intervals: normal  ST/T Wave abnormalities: normal  Conduction Disutrbances:none  Narrative Interpretation:   Old EKG Reviewed: No significant change from 05/08/2011  Patient had telemetry psychiatry consultation who recommends inpatient psychiatric stay Evette Cristal is in agreement Spoke with Shelda Jakes , pa who accepts pt in transfer to behavioral health Hospital MDM  In agreement the  patient warrants inpatient psychiatric stay as he is delusional and paranoid. Diagnosis #1 paranoia with delusional behavior #2hypokalemia #3palpitations        Doug Sou, MD 10/15/11 [id number]

## 2011-10-14 NOTE — ED Notes (Signed)
Gave old and new ECG to Dr. Vonna Kotyk after I performed. 6:49 pm JG

## 2011-10-14 NOTE — ED Notes (Signed)
Dr. Jacubowitz at bedside 

## 2011-10-14 NOTE — ED Notes (Signed)
Telepsych machine set up at bedside, per MD request.

## 2011-10-14 NOTE — ED Notes (Signed)
Patient currently resting quietly in bed; no respiratory or acute distress noted.  Patient has been updated on plan of care by Dr. Ethelda Chick.  Patient has no other questions or concerns at this time; will continue to monitor.

## 2011-10-14 NOTE — ED Notes (Signed)
Patient still in telepsych consult with MD; nurse tech at bedside.  Will continue to monitor.

## 2011-10-14 NOTE — ED Notes (Signed)
Pt reports started taken Ambien last night at 2330.

## 2011-10-15 ENCOUNTER — Encounter (HOSPITAL_COMMUNITY): Payer: Self-pay | Admitting: *Deleted

## 2011-10-15 ENCOUNTER — Inpatient Hospital Stay (HOSPITAL_COMMUNITY)
Admission: AD | Admit: 2011-10-15 | Discharge: 2011-10-16 | DRG: 748 | Disposition: A | Discharge status: Home / Self Care | Payer: BC Managed Care – PPO | Attending: Psychiatry | Admitting: Psychiatry

## 2011-10-15 DIAGNOSIS — Z818 Family history of other mental and behavioral disorders: Secondary | ICD-10-CM

## 2011-10-15 DIAGNOSIS — F329 Major depressive disorder, single episode, unspecified: Secondary | ICD-10-CM

## 2011-10-15 DIAGNOSIS — K219 Gastro-esophageal reflux disease without esophagitis: Secondary | ICD-10-CM

## 2011-10-15 DIAGNOSIS — Z79899 Other long term (current) drug therapy: Secondary | ICD-10-CM

## 2011-10-15 DIAGNOSIS — F1320 Sedative, hypnotic or anxiolytic dependence, uncomplicated: Principal | ICD-10-CM

## 2011-10-15 DIAGNOSIS — F3289 Other specified depressive episodes: Secondary | ICD-10-CM

## 2011-10-15 DIAGNOSIS — N400 Enlarged prostate without lower urinary tract symptoms: Secondary | ICD-10-CM

## 2011-10-15 DIAGNOSIS — I10 Essential (primary) hypertension: Secondary | ICD-10-CM

## 2011-10-15 DIAGNOSIS — E119 Type 2 diabetes mellitus without complications: Secondary | ICD-10-CM

## 2011-10-15 DIAGNOSIS — E669 Obesity, unspecified: Secondary | ICD-10-CM

## 2011-10-15 DIAGNOSIS — F411 Generalized anxiety disorder: Secondary | ICD-10-CM

## 2011-10-15 DIAGNOSIS — T424X5A Adverse effect of benzodiazepines, initial encounter: Secondary | ICD-10-CM

## 2011-10-15 DIAGNOSIS — M722 Plantar fascial fibromatosis: Secondary | ICD-10-CM

## 2011-10-15 DIAGNOSIS — F19939 Other psychoactive substance use, unspecified with withdrawal, unspecified: Secondary | ICD-10-CM

## 2011-10-15 HISTORY — DX: Depression, unspecified: F32.A

## 2011-10-15 HISTORY — DX: Major depressive disorder, single episode, unspecified: F32.9

## 2011-10-15 HISTORY — DX: Adverse effect of benzodiazepines, initial encounter: T42.4X5A

## 2011-10-15 HISTORY — DX: Cardiac murmur, unspecified: R01.1

## 2011-10-15 LAB — GLUCOSE, CAPILLARY
Glucose-Capillary: 85 mg/dL (ref 70–99)
Glucose-Capillary: 92 mg/dL (ref 70–99)

## 2011-10-15 LAB — HEMOGLOBIN A1C
Hgb A1c MFr Bld: 5.6 % (ref <5.7)
Mean Plasma Glucose: 114 mg/dL (ref <117)

## 2011-10-15 MED ORDER — ALUM & MAG HYDROXIDE-SIMETH 200-200-20 MG/5ML PO SUSP: 30.0000 mL | ORAL | Status: DC | PRN

## 2011-10-15 MED ORDER — METFORMIN HCL ER 500 MG PO TB24
500.0000 mg | ORAL_TABLET | Freq: Every day | ORAL | Status: DC
  Administered 2011-10-16: 500 mg via ORAL
  Filled 2011-10-15 (×3): qty 1

## 2011-10-15 MED ORDER — ESCITALOPRAM OXALATE 10 MG PO TABS
10.0000 mg | ORAL_TABLET | Freq: Every day | ORAL | Status: DC
  Administered 2011-10-15: 10 mg via ORAL
  Filled 2011-10-15: qty 1
  Filled 2011-10-15: qty 14
  Filled 2011-10-15 (×3): qty 1

## 2011-10-15 MED ORDER — OLMESARTAN MEDOXOMIL-HCTZ 40-25 MG PO TABS: 1.0000 | ORAL_TABLET | Freq: Every day | ORAL | Status: DC

## 2011-10-15 MED ORDER — POTASSIUM CHLORIDE CRYS ER 20 MEQ PO TBCR: 40.0000 meq | EXTENDED_RELEASE_TABLET | Freq: Once | ORAL | Status: DC

## 2011-10-15 MED ORDER — HYDROCHLOROTHIAZIDE 25 MG PO TABS
25.0000 mg | ORAL_TABLET | Freq: Every day | ORAL | Status: DC
  Administered 2011-10-15 – 2011-10-16 (×2): 25 mg via ORAL
  Filled 2011-10-15 (×5): qty 1

## 2011-10-15 MED ORDER — POTASSIUM CHLORIDE CRYS ER 20 MEQ PO TBCR
20.0000 meq | EXTENDED_RELEASE_TABLET | Freq: Every day | ORAL | Status: DC
  Administered 2011-10-15 – 2011-10-16 (×2): 20 meq via ORAL
  Filled 2011-10-15 (×4): qty 1

## 2011-10-15 MED ORDER — ACETAMINOPHEN 325 MG PO TABS: 650.0000 mg | ORAL_TABLET | Freq: Four times a day (QID) | ORAL | Status: DC | PRN

## 2011-10-15 MED ORDER — CLONAZEPAM 0.1 MG/ML ORAL SUSPENSION: 0.5000 mg | Freq: Once | ORAL | Status: DC

## 2011-10-15 MED ORDER — MAGNESIUM HYDROXIDE 400 MG/5ML PO SUSP: 30.0000 mL | Freq: Every day | ORAL | Status: DC | PRN

## 2011-10-15 MED ORDER — CLONAZEPAM 0.5 MG PO TABS
0.5000 mg | ORAL_TABLET | Freq: Once | ORAL | Status: AC
  Administered 2011-10-15: 0.5 mg via ORAL
  Filled 2011-10-15: qty 1

## 2011-10-15 MED ORDER — NISOLDIPINE ER 17 MG PO TB24
34.0000 mg | ORAL_TABLET | Freq: Every day | ORAL | Status: DC
  Administered 2011-10-15 – 2011-10-16 (×2): 34 mg via ORAL
  Filled 2011-10-15 (×4): qty 2

## 2011-10-15 MED ORDER — CLONAZEPAM 0.5 MG PO TABS
0.5000 mg | ORAL_TABLET | Freq: Two times a day (BID) | ORAL | Status: DC
  Administered 2011-10-15 – 2011-10-16 (×2): 0.5 mg via ORAL
  Filled 2011-10-15 (×2): qty 1

## 2011-10-15 MED ORDER — METFORMIN HCL ER 500 MG PO TB24
500.0000 mg | ORAL_TABLET | Freq: Once | ORAL | Status: AC
Start: 2011-10-15 — End: 2011-10-15
  Administered 2011-10-15: 500 mg via ORAL
  Filled 2011-10-15: qty 1

## 2011-10-15 MED ORDER — OLMESARTAN MEDOXOMIL 40 MG PO TABS
40.0000 mg | ORAL_TABLET | Freq: Every day | ORAL | Status: DC
  Administered 2011-10-15 – 2011-10-16 (×2): 40 mg via ORAL
  Filled 2011-10-15 (×4): qty 1

## 2011-10-15 MED ORDER — PNEUMOCOCCAL VAC POLYVALENT 25 MCG/0.5ML IJ INJ: 0.5000 mL | INJECTION | Freq: Once | INTRAMUSCULAR | Status: DC

## 2011-10-15 NOTE — Progress Notes (Signed)
[facility] Adult Inpatient Family/Significant Other Suicide Prevention Education  Suicide Prevention Education:  Education Completed; Myrene Buddy Stencil,  (sister) [phone] has been identified by the patient as the family member/significant other with whom the patient will be residing, and identified as the person(s) who will aid the patient in the event of a mental health crisis (suicidal ideations/suicide attempt).  With written consent from the patient, the family member/significant other has been provided the following suicide prevention education, prior to the and/or following the discharge of the patient.  The suicide prevention education provided includes the following:  Suicide risk factors  Suicide prevention and interventions  National Suicide Hotline telephone number  [facility] assessment telephone number  [facility] Emergency Assistance 911  [facility] and/or Residential Mobile Crisis Unit telephone number  Request made of family/significant other to:  Remove weapons (e.g., guns, rifles, knives), all items previously/currently identified as safety concern.    Remove drugs/medications (over-the-counter, prescriptions, illicit drugs), all items previously/currently identified as a safety concern.  The family member/significant other verbalizes understanding of the suicide prevention education information provided.  The family member/significant other agrees to remove the items of safety concern listed above.  Contact attempt was made with Gevena Mart LPCS, LCAS  Angela Wiley LPCS, LCAS assisted and over saw the explaination of the information and the pt's understanding of the information.  Romelle Reiley 10/15/2011, 9:53 AM

## 2011-10-15 NOTE — ED Notes (Signed)
Security transporting patient to [facility]; tech Gunnar Fusi) assisting.

## 2011-10-15 NOTE — ED Notes (Signed)
Security here to transport patient to behavioral health.

## 2011-10-15 NOTE — Progress Notes (Signed)
Pt has been pleasant and cooperative   He knows his medications and their theraputic use  He has attended groups and participates  Pt presently denies suicidal and homicidal ideation  He denies auditory and visual hallucinations    Verbal support given  Medications administered and effectiveness monitored   Q 15 min checks   Pt safe at present

## 2011-10-15 NOTE — Progress Notes (Signed)
Patient ID: Javier [name], male   DOB: Apr 02, 1951, 61 y.o.   MRN: [id number]   Pt.'s sister Ladell Heads came  on 10/15/11 during visitation and asked to speak with counselor . Marylene Land had called and completed SI education with the pt.'s sister earlier that day. I spoke with her and gave her a [place] SI pamphlet  And went over the criss information and numbers and she understood and had no questions. I also got contact information from her. She stated she lives in [place] but will be staying with Delance other sister where he was staying before he was admitted to [facility]. Ms. Glyn Ade states pt.'s other sister Lyla Son [phone]) is mentally challenged and would not be able to answer questions and that she can be reached at that number tomorrow since she will be staying for a day in [place]. Other contact numbers that were given by Ms. Stancil was her home number in [place], [place] [phone] or her cell number [phone]. She will be calling to speak with Silviano case manger or counselor tomorrow to check on his medical progress and his medications.

## 2011-10-15 NOTE — Tx Team (Signed)
Initial Interdisciplinary Treatment Plan  PATIENT STRENGTHS: (choose at least two) Average or above average intelligence Capable of independent living Communication skills Financial means General fund of knowledge Motivation for treatment/growth Work skills  PATIENT STRESSORS: Health problems Loss of wife (separated)* Marital or family conflict Medication change or noncompliance   PROBLEM LIST: Problem List/Patient Goals Date to be addressed Date deferred Reason deferred Estimated date of resolution  Depression  10/15/11     Anxiety 10/15/11                                                DISCHARGE CRITERIA:  Ability to meet basic life and health needs Adequate post-discharge living arrangements Improved stabilization in mood, thinking, and/or behavior Medical problems require only outpatient monitoring Need for constant or close observation no longer present Verbal commitment to aftercare and medication compliance  PRELIMINARY DISCHARGE PLAN: Return to previous living arrangement  PATIENT/FAMIILY INVOLVEMENT: This treatment plan has been presented to and reviewed with the patient, Javier [name], and/or family member,.  The patient and family have been given the opportunity to ask questions and make suggestions.  Javier [name] [facility] 10/15/2011, 3:29 AM

## 2011-10-15 NOTE — ED Notes (Signed)
Javier [name] at [facility] called back; states that the patient can come over as soon as ACT team is finished with patient.

## 2011-10-15 NOTE — ED Notes (Signed)
Report given to Marvia Pickles at [facility]; informed Marvia Pickles that ACT team is currently at bedside; offered opportunity for Marvia Pickles to ask questions; Marvia Pickles states that she needs to talk to the PA about patient's blood pressure before we send patient over to [facility].  Waiting for call back from Marvia Pickles.  ACT team still at bedside with patient.

## 2011-10-15 NOTE — Progress Notes (Signed)
Patient came to medication window requesting fixodent for his dentures. Writer inquired about how his day has been and patient reported that it has been good. Patient currently denies having pain, -si/hi/a/v hall. Patient was informed of his 2200 medications and in return told writer what medications he takes in the am which were all correct. Patient was given fixodent to use and returned the tube back to medication window when finished. Safety maintained on unit, will continue to monitor.

## 2011-10-15 NOTE — [facility] Suicide Risk Assessment (Signed)
Suicide Risk Assessment  Admission Assessment     Demographic factors:  Assessment Details Time of Assessment: Admission Information Obtained From: Patient Current Mental Status:    Loss Factors:  Loss Factors: Loss of significant relationship Historical Factors:  Historical Factors: Family history of mental illness or substance abuse Risk Reduction Factors:  Risk Reduction Factors: Sense of responsibility to family;Employed;Living with another person, especially a relative  CLINICAL FACTORS:   Panic Attacks  COGNITIVE FEATURES THAT CONTRIBUTE TO RISK:  Polarized thinking    SUICIDE RISK:   Minimal: No identifiable suicidal ideation.  Patients presenting with no risk factors but with morbid ruminations; may be classified as minimal risk based on the severity of the depressive symptoms  PLAN OF CARE: Reviewed chart and assessment. Will start lexapro 10 mg i Qhs and Klonopin 0.5 mg i PO BIID including now dose.    Jia Dottavio,JANARDHAHA R. 10/15/2011, 10:41 AM

## 2011-10-15 NOTE — Progress Notes (Signed)
D:  Pt requests a diabetic diet while at hospital.  Pt says this diet helps to take off weight he would like to lose.  A:  Reported to RN about pt request.  R:  Pt is safe. Aundria Rud, Jauan Wohl L, MHT/NS

## 2011-10-15 NOTE — ED Notes (Signed)
Called to give report to [facility] (patient has bed assigned: 406-2); was told that [facility] spoke to [place] (Consulting civil engineer) int he ED, and that ACT team is on way to [facility] to see and assess patient.  Reita Cliche, charge RN states that report should be called now, and that ACT team assessment should not delay patient being sent to [facility].  Will call back to give report.

## 2011-10-15 NOTE — H&P (Signed)
Met with patient personally and Agree with H&P and treatment Plan.  

## 2011-10-15 NOTE — ED Notes (Signed)
ACT team at bedside.  

## 2011-10-15 NOTE — H&P (Signed)
Psychiatric Admission Assessment Adult  Patient Identification:  Javier [name] Date of Evaluation:  10/15/2011 61 yo sep AAM History of Present Illness::  Was diagnosed with Type II diabetes last July-August and shortly after told his [facility] that he was anxious. PCC prescribed klonopin.5mg  bid and wanted the patient to get a Psych eval at [facility]. Patient never followed up and his Klonopin Rx expired last week.Saturday he called 911 as he felt his heart racing again. He had a similar episode Friday which resolved spontaneously. He was also concerned that his sister's Boyfriend might be putting meds into his food. He has lived with his sister for several years. She met her BF when they both lived in a group home. Apparently they have a robust sexual life and try to limit their activity to when he is at work. This is why he is concerned that the BF might be putting drugs into his food.  Past Psychiatric History: As above   Substance Abuse History:  Social History:    reports that he has never smoked. He has never used smokeless tobacco. He reports that he does not drink alcohol or use illicit drugs. Has a GED works at [employer] Married twice. Has been separated from second wife a number of years. She wants him to go back [place] but he likes it here.  This is another cause for his anxiety.  Family Psych History: Sister has mental illness he doesn't know the diagnosis.  Past Medical History:     Past Medical History  Diagnosis Date  . BENIGN PROSTATIC HYPERTROPHY 02/20/2007  . ELEVATED PROSTATE SPECIFIC ANTIGEN 02/26/2009  . 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 FASCIITIS, PLANTAR 09/30/2007  . GERD 02/20/2007  . HYPERTENSION 02/20/2007  . HYPOKALEMIA 03/02/2009  . Obesity   . Diabetes mellitus   . Heart murmur   . Depression        Past Surgical History  Procedure Date  . Knee surgery     left    Allergies: No Known Allergies  Current Medications:  Prior to Admission medications     Medication Sig Start Date End Date Taking? Authorizing Provider  metFORMIN (GLUCOPHAGE XR) 500 MG 24 hr tablet Take 1 tablet (500 mg total) by mouth daily with breakfast. 05/30/11 05/29/12  Rogelia Boga, MD  nisoldipine (SULAR) 34 MG 24 hr tablet Take 1 tablet (34 mg total) by mouth daily. 12/29/10   Rogelia Boga, MD  olmesartan-hydrochlorothiazide (BENICAR HCT) 40-25 MG per tablet Take 1 tablet by mouth daily. 06/09/11 06/08/12  Rogelia Boga, MD  potassium chloride SA (KLOR-CON M20) 20 MEQ tablet Take 1 tablet (20 mEq total) by mouth daily. 06/09/11 06/08/12  Rogelia Boga, MD  tadalafil (CIALIS) 20 MG tablet Take 20 mg by mouth daily as needed. Erectile Dysfunction 12/29/10   Rogelia Boga, MD    Mental Status Examination/Evaluation: Objective:  Appearance: Well Groomed  Psychomotor Activity:  Normal  Eye Contact::  Good  Speech:  Clear and Coherent  Volume:  Normal  Mood:  Calm appropriate   Affect:  Appropriate  Thought Process: clear rational goal oriented -get on correct meds for anxiety    Orientation:  Full  Thought Content:  No frank AVH or psychosis   Suicidal Thoughts:  No  Homicidal Thoughts:  No  Judgement:  Intact  Insight:  Good    DIAGNOSIS:    AXIS I Benzo withdrawl  Anxiety   AXIS II Deferred and   AXIS III  See medical history.  AXIS IV economic problems, housing problems, problems related to social environment and problems with primary support group  AXIS V 51-60 moderate symptoms     Treatment Plan Summary: Admit for safety & stabilization Start meds for anxiety  Needs an appointment for follow up at discharge  Agree with H&P from ED

## 2011-10-15 NOTE — Progress Notes (Signed)
Patient ID: Javier [name], male   DOB: 06-28-1951, 61 y.o.   MRN: [id number] This is a 61 y.o. Separated/AA/M vol. Admission with a Dx of Anxiety D/O. The patient states he has been depressed due marital and family conflicts. He is separated from his wife who is living in [place] and  living with his sister who is mentally ill. Last week he ran out of his Xanax and his doctor did not renew his medication. Reports he started having a strange sensation in his chest. He went to the ED because he was not sure if he was having an anxiety attack or a heart attack. The Tele-psych doctor suggested that he might be withdrawing from the Xanax. Denies any substance abuse. Denies any suicidal/homicidal ideation. Denies any A/V hallucination. Admits to feeling depressed and feeling stressed due to issues with his sister and her boyfriend who is also mentally ill.

## 2011-10-15 NOTE — BH Assessment (Signed)
Assessment Note   Javier [name] is an 61 y.o. male. Who is being presented in the ED for having "severe anxiety."  He reports of having chest pain and being unable to stop shaking.  He shared that the psychiatrist from the [facility] informed him that it may be due to him stop taking his medication(Clonazepam)  for his anxiety.  He further stated that he has had history of anxiety and it has been due to having problems with his relationship with his wife and or stress from his jobs.  When ever he has the symptoms of his anxiety  "it feels like I am going to die."  His current stressors is his job and his living arrangements with his sisters.  He reports that one of his sisters suffers from a mental illness and both her and her boyfriend has been having problems with him and it is "causing unnecessary stress for him."  During the assessment his mood and disposition was pleasant and cooperative.  He had some insight into mental health needs and was able to communicate what he believe he needs based on previous, similar situations that lead to this level of current anxiety.   Axis I: Anxiety Disorder NOS Axis II: 799.9 Deffered Axis III: Axis IV: Family stressors and living situations. Axis V GAF 40 Past Medical History:  Past Medical History  Diagnosis Date  . BENIGN PROSTATIC HYPERTROPHY 02/20/2007  . ELEVATED PROSTATE SPECIFIC ANTIGEN 02/26/2009  . 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 FASCIITIS, PLANTAR 09/30/2007  . GERD 02/20/2007  . HYPERTENSION 02/20/2007  . HYPOKALEMIA 03/02/2009  . Obesity   . Diabetes mellitus     Past Surgical History  Procedure Date  . Knee surgery     left    Family History: History reviewed. No pertinent family history.  Social History:  reports that he has never smoked. He has never used smokeless tobacco. He reports that he does not drink alcohol or use illicit drugs.  Additional Social History:  Alcohol / Drug Use History of alcohol / drug use?: No  history of alcohol / drug abuse Longest period of sobriety (when/how long): Reports of having no substance abuse history Allergies: No Known Allergies  Home Medications:  Medications Prior to Admission  Medication Dose Route Frequency Provider Last Rate Last Dose  . clonazePAM (KLONOPIN) tablet 0.5 mg  0.5 mg Oral Once Doug Sou, MD   0.5 mg at 10/15/11 0033  . potassium chloride SA (K-DUR,KLOR-CON) CR tablet 40 mEq  40 mEq Oral Once Doug Sou, MD   40 mEq at 10/14/11 2123  . sodium chloride 0.9 % bolus 1,000 mL  1,000 mL Intravenous Once Glynn Octave, MD   1,000 mL at 10/13/11 1041  . DISCONTD: clonazePAM (KLONOPIN) 0.1 mg/mL oral suspension 0.5 mg  0.5 mg Oral Once Doug Sou, MD      . DISCONTD: potassium chloride SA (K-DUR,KLOR-CON) CR tablet 40 mEq  40 mEq Oral Once Doug Sou, MD       Medications Prior to Admission  Medication Sig Dispense Refill  . metFORMIN (GLUCOPHAGE XR) 500 MG 24 hr tablet Take 1 tablet (500 mg total) by mouth daily with breakfast.  30 tablet  11  . nisoldipine (SULAR) 34 MG 24 hr tablet Take 1 tablet (34 mg total) by mouth daily.  90 tablet  6  . olmesartan-hydrochlorothiazide (BENICAR HCT) 40-25 MG per tablet Take 1 tablet by mouth daily.  90 tablet  1  . potassium chloride SA (KLOR-CON  M20) 20 MEQ tablet Take 1 tablet (20 mEq total) by mouth daily.  90 tablet  1  . tadalafil (CIALIS) 20 MG tablet Take 20 mg by mouth daily as needed. Erectile Dysfunction        OB/GYN Status:  No LMP for male patient.  General Assessment Data Location of Assessment: [facility] ED ACT Assessment: Yes Living Arrangements: Relatives (Live with sisters) Can pt return to current living arrangement?: Yes Admission Status: Voluntary Is patient capable of signing voluntary admission?: Yes Transfer from: Acute Hospital Referral Source: Self/Family/Friend  Education Status Is patient currently in school?: No  Risk to self Suicidal Ideation: No Suicidal Intent:  No Is patient at risk for suicide?: No Suicidal Plan?: No Access to Means: No What has been your use of drugs/alcohol within the last 12 months?: None reported Previous Attempts/Gestures: No How many times?: 0  Other Self Harm Risks: None reported Triggers for Past Attempts: Family contact;Spouse contact Intentional Self Injurious Behavior: None Family Suicide History: Unknown Recent stressful life event(s): Conflict (Comment);Divorce;Financial Problems Persecutory voices/beliefs?: No Depression: Yes Depression Symptoms: Insomnia;Isolating;Fatigue;Feeling angry/irritable Substance abuse history and/or treatment for substance abuse?: No Suicide prevention information given to non-admitted patients: Not applicable  Risk to Others Homicidal Ideation: No Thoughts of Harm to Others: No Current Homicidal Intent: No Current Homicidal Plan: No Access to Homicidal Means: No Identified Victim: None noted History of harm to others?: No Assessment of Violence: None Noted Violent Behavior Description: None noted Does patient have access to weapons?: No Criminal Charges Pending?: No Does patient have a court date: No  Psychosis Hallucinations: None noted Delusions: None noted  Mental Status Report Appear/Hygiene: Other (Comment) (Remarkable) Eye Contact: Good Motor Activity: Unremarkable Speech: Logical/coherent;Soft Level of Consciousness: Alert Mood: Depressed;Anxious;Empty Affect: Anxious;Depressed Anxiety Level: Severe Thought Processes: Coherent;Relevant Judgement: Unimpaired Orientation: Person;Place;Time;Situation Obsessive Compulsive Thoughts/Behaviors: Minimal  Cognitive Functioning Concentration: Normal Memory: Recent Intact;Remote Intact IQ: Average Insight: Fair Impulse Control: Fair Appetite: Good Weight Loss: 0  Weight Gain: 0  Sleep: Decreased Total Hours of Sleep: 4  Vegetative Symptoms: None  Prior Inpatient Therapy Prior Inpatient Therapy: No Prior  Therapy Dates: None noted Prior Therapy Facilty/Provider(s): None noted Reason for Treatment: None noted  Prior Outpatient Therapy Prior Outpatient Therapy: Yes Prior Therapy Dates: 1983 Prior Therapy Facilty/Provider(s): In [place] Reason for Treatment: Anixety          Abuse/Neglect Assessment (Assessment to be complete while patient is alone) Physical Abuse: Denies Verbal Abuse: Denies Sexual Abuse: Denies Exploitation of patient/patient's resources: Denies Self-Neglect: Denies Values / Beliefs Cultural Requests During Hospitalization: None Spiritual Requests During Hospitalization: None Consults Social Work Consult Needed: No      Additional Information 1:1 In Past 12 Months?: No CIRT Risk: No Elopement Risk: No Does patient have medical clearance?: Yes  Child/Adolescent Assessment Running Away Risk: Denies Bed-Wetting: Denies Destruction of Property: Denies Cruelty to Animals: Denies Stealing: Denies Rebellious/Defies Authority: Denies Satanic Involvement: Denies Archivist: Denies Problems at [employer]: Denies Gang Involvement: Denies  Disposition:  Disposition Disposition of Patient: Inpatient treatment program [facility] room 406) Type of inpatient treatment program: Adult  On Site Evaluation by:   Reviewed with Physician:     Morley Kos LCAS, LPCA, NCC 10/15/2011 12:41 AM

## 2011-10-15 NOTE — ED Notes (Signed)
ACT Team at bedside; waiting for paperwork from ACT team before calling security for transportation to behavioral health.

## 2011-10-15 NOTE — ED Notes (Signed)
Waiting for ACT team to put in assessment before patient can be transported to behavioral health.

## 2011-10-15 NOTE — BHH Counselor (Signed)
Adult Comprehensive Assessment  Patient ID: Javier [name], male   DOB: 05/25/1951, 61 y.o.   MRN: [id number]  Information Source: Information source: Patient  Current Stressors:  Educational / Learning stressors: N/A Employment / Job issues: N/A Family Relationships: N/A Surveyor, quantity / Lack of resources (include bankruptcy): N/A Housing / Lack of housing: Pt want to find a place of his own. Physical health (include injuries & life threatening diseases): Pt has Type 2 Diabetes and had knee surgery in July 2012. Social relationships: N/A Substance abuse: N/A Bereavement / Loss: N/A  Living/Environment/Situation:  Living Arrangements: Family members Living conditions (as described by patient or guardian): Pt was living with half sister, Javier [name]. How long has patient lived in current situation?: "Several years." What is atmosphere in current home: Other (Comment) ("Uncomfortable.")  Family History:  Marital status: Married Number of Years Married: 27  What types of issues is patient dealing with in the relationship?: Pt reports that wife smokes cocaine, but is apparently in recovery. Does patient have children?: Yes How many children?: 57  (47 year old [name] - from previous marriage.) How is patient's relationship with their children?: Pt says that [name] hold "a lot of anomicity" towards him.  Childhood History:  By whom was/is the patient raised?: Grandparents (Paternal Grandmother.) Additional childhood history information: Pt's Mother was a live in [place] in [place], so he was raised by his grandmother. Description of patient's relationship with caregiver when they were a child: "Very good." Patient's description of current relationship with people who raised him/her: Grandmother has since passed. Does patient have siblings?: Yes Number of Siblings: 2  (2 older, half sisters.) Description of patient's current relationship with siblings: Pt says that his relationship with Javier [name] is "good"  and  "wonderful" with his other sister Javier [name]. Did patient suffer any verbal/emotional/physical/sexual abuse as a child?: No Did patient suffer from severe childhood neglect?: No Has patient ever been sexually abused/assaulted/raped as an adolescent or adult?: No Was the patient ever a victim of a crime or a disaster?: No Witnessed domestic violence?: No Has patient been effected by domestic violence as an adult?: No  Education:  Highest grade of school patient has completed: Completed GED. 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  How long has patient been employed?: 5 years in July. Patient's job has been impacted by current illness: No What is the longest time patient has a held a job?: 11 years. Where was the patient employed at that time?: [employer]. Has patient ever been in the [employer]?: Yes (Describe in comment) (In [employer] for three years, pt was a Archivist upon discharge.) Has patient ever served in combat?: Yes Patient description of combat service: Pt was in [place] November 1971-1974.  Financial Resources:   Financial resources: Income from employment Does patient have a representative payee or guardian?: No  Alcohol/Substance Abuse:   What has been your use of drugs/alcohol within the last 12 months?: Pt reports to not using alcohol or druges. If attempted suicide, did drugs/alcohol play a role in this?: No Alcohol/Substance Abuse Treatment Hx: Denies past history Has alcohol/substance abuse ever caused legal problems?: No  Social Support System:   Patient's Community Support System: Fair Describe Community Support System: Javier [name] (wife), Javier [name] (sister), and friend. Type of faith/religion: Baptist. How does patient's faith help to cope with current illness?: Pt prayer, reads the Bible and listens to  radio.  Leisure/Recreation:   Leisure and Hobbies: Pt enjoys seeing movies and taking walks in the park.  Strengths/Needs:   What things does the patient do well?: Pt says his job and that he gets compliments often at work.  In what areas does patient struggle / problems for patient: Pt trying to be a better person than he used to be.  Discharge Plan:   Does patient have access to transportation?: Yes (Possibly his sister Javier [name].) Will patient be returning to same living situation after discharge?: No Plan for living situation after discharge: Pt is interested in getting an apartment on his own or getting a hotel. Currently receiving community mental health services: No If no, would patient like referral for services when discharged?: Yes (What county?) ([place].) Does patient have financial barriers related to discharge medications?: No  Summary/Recommendations:   Summary and Recommendations (to be completed by the evaluator): Pt is a 62 year old male seeking treatment for anxiety. Recommendations for treatment include crisis stablization, case management, medicine managenment, psychoeducation groups to teach coping skills, and group counseling.  Javier [name]. 10/15/2011

## 2011-10-15 NOTE — ED Notes (Signed)
Patient currently sitting up in bed; no respiratory or acute distress noted.  Patient updated on plan of care; informed patient that we will transport him to [facility] as soon as ACT team is done with his assessment.  Patient has no other questions or concerns at this time; will continue to monitor.

## 2011-10-15 NOTE — ED Notes (Signed)
Calling report now. 

## 2011-10-15 NOTE — [facility] Notes (Signed)
Patient ID: Javier [name], male   DOB: Jan 21, 1951, 61 y.o.   MRN: [id number]   [facility] Group Notes:  (Counselor/Nursing/MHT/Case Management/Adjunct)  10/15/2011 11 AM  Type of Therapy:  Group Therapy, Dance/Movement Therapy   Participation Level:  Active  Participation Quality:  Appropriate  Affect:  Appropriate  Cognitive:  Oriented  Insight:  Limited  Engagement in Group:  Good  Engagement in Therapy:  Good  Modes of Intervention:  Clarification, Problem-solving, Role-play, Socialization and Support  Summary of Progress/Problems:  Group focused on forms of support - external (i.e. Family, friends, etc.) and internal (i.e. Physical exercise, attending to needs, etc.) as well as giving and receiving support from peers through movement reflection. Pt mentioned that his wife is a healthy source of support. Pt was fully engaged in the group process and appeared coherent. This indicates that he is ready to establish and carry out an effective recovery plan.   Thomasena Edis, [employer]

## 2011-10-16 ENCOUNTER — Telehealth: Payer: Self-pay | Admitting: Family Medicine

## 2011-10-16 ENCOUNTER — Ambulatory Visit: Payer: BC Managed Care – PPO | Admitting: Internal Medicine

## 2011-10-16 DIAGNOSIS — T50995A Adverse effect of other drugs, medicaments and biological substances, initial encounter: Secondary | ICD-10-CM

## 2011-10-16 DIAGNOSIS — T424X5A Adverse effect of benzodiazepines, initial encounter: Secondary | ICD-10-CM

## 2011-10-16 LAB — GLUCOSE, CAPILLARY
Glucose-Capillary: 106 mg/dL — ABNORMAL HIGH (ref 70–99)
Glucose-Capillary: 135 mg/dL — ABNORMAL HIGH (ref 70–99)

## 2011-10-16 MED ORDER — CARBAMAZEPINE 200 MG PO TABS
200.0000 mg | ORAL_TABLET | Freq: Three times a day (TID) | ORAL | Status: DC
  Administered 2011-10-16 (×2): 200 mg via ORAL
  Filled 2011-10-16: qty 42
  Filled 2011-10-16 (×2): qty 1
  Filled 2011-10-16: qty 42
  Filled 2011-10-16 (×3): qty 1
  Filled 2011-10-16: qty 42
  Filled 2011-10-16 (×2): qty 1

## 2011-10-16 MED ORDER — OLMESARTAN MEDOXOMIL-HCTZ 40-25 MG PO TABS: 1.0000 | ORAL_TABLET | Freq: Every day | ORAL | Status: DC

## 2011-10-16 MED ORDER — TADALAFIL 20 MG PO TABS: 20.0000 mg | ORAL_TABLET | Freq: Every day | ORAL | Status: DC | PRN

## 2011-10-16 MED ORDER — NISOLDIPINE ER 34 MG PO TB24: 34.0000 mg | ORAL_TABLET | Freq: Every day | ORAL | Status: DC

## 2011-10-16 MED ORDER — POTASSIUM CHLORIDE CRYS ER 20 MEQ PO TBCR: 20.0000 meq | EXTENDED_RELEASE_TABLET | Freq: Every day | ORAL | Status: DC

## 2011-10-16 MED ORDER — ESCITALOPRAM OXALATE 10 MG PO TABS: 10.0000 mg | ORAL_TABLET | Freq: Every day | ORAL | Status: DC

## 2011-10-16 MED ORDER — CHLORPROMAZINE HCL 25 MG PO TABS
12.5000 mg | ORAL_TABLET | ORAL | Status: AC
  Administered 2011-10-16: 12.5 mg via ORAL
  Filled 2011-10-16 (×2): qty 1

## 2011-10-16 MED ORDER — ESCITALOPRAM OXALATE 10 MG PO TABS: ORAL_TABLET | ORAL | Status: DC

## 2011-10-16 MED ORDER — METFORMIN HCL ER 500 MG PO TB24: 500.0000 mg | ORAL_TABLET | Freq: Every day | ORAL | Status: DC

## 2011-10-16 MED ORDER — CARBAMAZEPINE 200 MG PO TABS: ORAL_TABLET | ORAL | Status: DC

## 2011-10-16 MED ORDER — CARBAMAZEPINE 200 MG PO TABS: 200.0000 mg | ORAL_TABLET | Freq: Three times a day (TID) | ORAL | Status: DC

## 2011-10-16 NOTE — BHH Suicide Risk Assessment (Signed)
Suicide Risk Assessment  Discharge Assessment     Demographic factors:  Assessment Details Time of Assessment: Admission Information Obtained From: Patient Current Mental Status:    Risk Reduction Factors:  Risk Reduction Factors: Sense of responsibility to family;Employed;Living with another person, especially a relative  CLINICAL FACTORS:   Severe Anxiety and/or Agitation Medical Diagnoses and Treatments/Surgeries  COGNITIVE FEATURES THAT CONTRIBUTE TO RISK:  Thought constriction (tunnel vision)    SUICIDE RISK:   Mild:  Suicidal ideation of limited frequency, intensity, duration, and specificity.  There are no identifiable plans, no associated intent, mild dysphoria and related symptoms, good self-control (both objective and subjective assessment), few other risk factors, and identifiable protective factors, including available and accessible social support.  ADL's:  Intact  Sleep: Good  Appetite:  Good  Suicidal Ideation:  Denies adamantly any suicidal thoughts. Homicidal Ideation:  Denies adamantly any homicidal thoughts.  Mental Status Examination/Evaluation: Objective:  Appearance: Casual  Eye Contact::  Absent  Speech:  Clear and Coherent  Volume:  Normal  Mood:  Euthymic  Affect:  Congruent  Thought Process:  Goal Directed  Orientation:  Full  Thought Content:  WDL  Suicidal Thoughts:  No  Homicidal Thoughts:  No  Memory:  Immediate;   Good  Judgement:  Good  Insight:  Good  Psychomotor Activity:  Normal  Concentration:  Good  Recall:  Good  Akathisia:  No  AIMS (if indicated):     Assets:  Communication Skills Desire for Improvement Financial Resources/Insurance Housing Intimacy Leisure Time Physical Health Resilience Social Support Talents/Skills Transportation Vocational/Educational  Sleep:  Number of Hours: 5     Vital Signs: Blood pressure 151/63, pulse 87, temperature 98.5 F (36.9 C), temperature source Oral, resp. rate 16, height 5'  6.5" (1.689 m), weight 86.183 kg (190 lb). Current Medications:  Current Facility-Administered Medications  Medication Dose Route Frequency Provider Last Rate Last Dose  . acetaminophen (TYLENOL) tablet 650 mg  650 mg Oral Q6H PRN Verne Spurr, PA      . alum & mag hydroxide-simeth (MAALOX/MYLANTA) 200-200-20 MG/5ML suspension 30 mL  30 mL Oral Q4H PRN Verne Spurr, PA      . carbamazepine (TEGRETOL) tablet 200 mg  200 mg Oral TID Orson Aloe, MD   200 mg at 10/16/11 1240  . chlorproMAZINE (THORAZINE) tablet 12.5 mg  12.5 mg Oral NOW Orson Aloe, MD   12.5 mg at 10/16/11 1107  . escitalopram (LEXAPRO) tablet 10 mg  10 mg Oral QHS Nehemiah Settle, MD   10 mg at 10/15/11 2153  . olmesartan (BENICAR) tablet 40 mg  40 mg Oral Daily Franchot Gallo, MD   40 mg at 10/16/11 6962   And  . hydrochlorothiazide (HYDRODIURIL) tablet 25 mg  25 mg Oral Daily Franchot Gallo, MD   25 mg at 10/16/11 9528  . magnesium hydroxide (MILK OF MAGNESIA) suspension 30 mL  30 mL Oral Daily PRN Verne Spurr, PA      . metFORMIN (GLUCOPHAGE-XR) 24 hr tablet 500 mg  500 mg Oral Q breakfast Mickie D. Adams, PA   500 mg at 10/16/11 4132  . nisoldipine (SULAR) 24 hr tablet 34 mg  34 mg Oral Daily Mickie D. Adams, PA   34 mg at 10/16/11 4401  . pneumococcal 23 valent vaccine (PNU-IMMUNE) injection 0.5 mL  0.5 mL Intramuscular Once Harvie Heck Readling, MD      . potassium chloride SA (K-DUR,KLOR-CON) CR tablet 20 mEq  20 mEq Oral Daily Mickie D. Pernell Dupre, [place]  20 mEq at 10/16/11 0823  . DISCONTD: clonazePAM (KLONOPIN) tablet 0.5 mg  0.5 mg Oral BID Nehemiah Settle, MD   0.5 mg at 10/16/11 4540    Lab Results:  Results for orders placed during the hospital encounter of 10/15/11 (from the past 48 hour(s))  GLUCOSE, CAPILLARY     Status: Abnormal   Collection Time   10/15/11  2:15 AM      Component Value Range Comment   Glucose-Capillary 136 (*) 70 - 99 (mg/dL)   HEMOGLOBIN J8J     Status: Normal   Collection  Time   10/15/11  6:50 AM      Component Value Range Comment   Hemoglobin A1C 5.6  <5.7 (%)    Mean Plasma Glucose 114  <117 (mg/dL)   TSH     Status: Normal   Collection Time   10/15/11  6:50 AM      Component Value Range Comment   TSH 1.949  0.350 - 4.500 (uIU/mL)   GLUCOSE, CAPILLARY     Status: Abnormal   Collection Time   10/15/11  9:25 AM      Component Value Range Comment   Glucose-Capillary 182 (*) 70 - 99 (mg/dL)    Comment 1 Notify RN     GLUCOSE, CAPILLARY     Status: Abnormal   Collection Time   10/15/11 11:44 AM      Component Value Range Comment   Glucose-Capillary 131 (*) 70 - 99 (mg/dL)    Comment 1 Notify RN     GLUCOSE, CAPILLARY     Status: Normal   Collection Time   10/15/11  5:11 PM      Component Value Range Comment   Glucose-Capillary 85  70 - 99 (mg/dL)    Comment 1 Notify RN     GLUCOSE, CAPILLARY     Status: Normal   Collection Time   10/15/11  8:58 PM      Component Value Range Comment   Glucose-Capillary 92  70 - 99 (mg/dL)   GLUCOSE, CAPILLARY     Status: Abnormal   Collection Time   10/16/11  5:47 AM      Component Value Range Comment   Glucose-Capillary 106 (*) 70 - 99 (mg/dL)   GLUCOSE, CAPILLARY     Status: Abnormal   Collection Time   10/16/11 11:34 AM      Component Value Range Comment   Glucose-Capillary 135 (*) 70 - 99 (mg/dL)    Comment 1 Documented in Chart      Comment 2 Notify RN      Physical Findings: AIMS:   CIWA:     COWS:      Reports what he has learned from this hospital stay is that he needs to control his anxiety by the right medications and helping himself.  Risk of harm to self is minimal in that he has never attempted suicide and has a wife and has some goals for his life.  Risk of harm to others is minimal in that he has never been involved in fights or had any legal charges related to violence.  Continue Medication List  As of 10/16/2011  4:14 PM   TAKE these medications         carbamazepine 200 MG tablet    Commonly known as: TEGRETOL   Take 1 tablet (200 mg total) by mouth 3 (three) times daily. Mood stabilizer    Provide 2 weeks supply please  escitalopram 10 MG tablet   Commonly known as: LEXAPRO   Take 1 tablet (10 mg total) by mouth daily. For depression    2 weeks supply sample      metFORMIN 500 MG 24 hr tablet   Commonly known as: GLUCOPHAGE-XR   Take 1 tablet (500 mg total) by mouth daily with breakfast. For control of blood sugar      nisoldipine 34 MG 24 hr tablet   Commonly known as: SULAR   Take 1 tablet (34 mg total) by mouth daily. For control of high blood pressure      olmesartan-hydrochlorothiazide 40-25 MG per tablet   Commonly known as: BENICAR HCT   Take 1 tablet by mouth daily. For control of high blood pressure      potassium chloride SA 20 MEQ tablet   Commonly known as: K-DUR,KLOR-CON   Take 1 tablet (20 mEq total) by mouth daily. For potassium replacement      tadalafil 20 MG tablet   Commonly known as: CIALIS   Take 1 tablet (20 mg total) by mouth daily as needed. For Erectile Dysfunction           Plan: Discharge home  [place], [place] 10/16/2011, 4:14 PM

## 2011-10-16 NOTE — [facility] Notes (Signed)
[facility] Group Notes: (Counselor/Nursing/MHT/Case Management/Adjunct) 10/16/2011   @  11:00am   Type of Therapy:  Group Therapy  Participation Level:  None  Participation Quality:  None  Affect:  Blunted  Cognitive:  Appropriate  Insight:  None  Engagement in Group: None  Engagement in Therapy:  None  Modes of Intervention:  Support and Exploration  Summary of Progress/Problems: Javier [name] came in and out of group, did not engage in group process.   (Facilitated by Charlyne Mom, doctoral psych intern)  [name] 10/16/2011 4:14 PM

## 2011-10-16 NOTE — [facility] Notes (Signed)
[facility] Case Management Discharge Plan:  Will you be returning to the same living situation after discharge: Yes,  Javier [name] will return to his home At discharge, do you have transportation home?:Yes,  Patient will arrange his own transportation home Do you have the ability to pay for your medications:Yes,  Patient has private insurance  Interagency Information:     Release of information consent forms completed and in the chart;  Patient's signature needed at discharge.  Patient to Follow up at: [facility]     Friday, October 20, 2011 at 10:00 a.m.    Patient denies SI/HI:   Yes,  Denies SI today and prior to admission    Safety Planning and Suicide Prevention discussed:  Reviewed  By weekend staff per note  Barrier to discharge identified:No.  Summary and Recommendations:  No recommendations   Javier [name], Javier [name] July 10/16/2011, 11:16 AM

## 2011-10-16 NOTE — [facility] Notes (Signed)
Patient ID: Javier [name], male   DOB: 03-Feb-1951, 61 y.o.   MRN: [id number] Patient's self inventory sheet, has poor sleep, improving appetite, normal energy level.  Rated depression and hopelessness #1.  Denied SI.   No physical problems.  Wants to be pain free.   Worst pain #3.   Plans to go for walks in park, to movies and socialize more.   No questions for staff.  Does have discharge plans, no medication problems.

## 2011-10-16 NOTE — [facility] Notes (Signed)
[facility] Group Notes:  (Counselor/Nursing/MHT/Case Management/Adjunct)    Type of Therapy:  Group Therapy  Participation Level:  Did Not Attend       [name] 10/16/2011  4:15 PM

## 2011-10-16 NOTE — Telephone Encounter (Signed)
Office Message from Date: 10/15/2011 12:00:00 AM Time of Call: [phone] Faxed To: [facility] CallerBrooks Kinnan Fax Number: [phone] Facility: N/A Patient: Javier [name], Javier [name] DOB: 1951/01/11 Phone: [phone] Provider: Eleonore Chiquito Message: See info below Regarding Appointment: Yes Appt Date: 10/16/2011 Appt Time: 11:15:00 AM Provider: Eleonore Chiquito Reason: Details: Please cancel appt he is in [facility]. Outcome: Instructed patient to call back on the next business day. Message Taken by: Thomasena Edis, CSR FAX Call-A

## 2011-10-16 NOTE — Discharge Summary (Signed)
Physician Discharge Summary Note  Patient:  Javier [name] is an 61 y.o., male MRN:  [id number] DOB:  January 04, 1951 Patient phone:  [phone] (home)  Patient address:   [address] [address] [place] [address],   Date of Admission:  10/15/2011 Date of Discharge: 10/15/10  Discharge Diagnoses: Principal Problem:  *Adverse effect of benzodiazepine   Axis Diagnosis:   AXIS I:  Adverse effects of benzodiazepine AXIS II:  Deferred AXIS III:   Past Medical History  Diagnosis Date  . BENIGN PROSTATIC HYPERTROPHY 02/20/2007  . ELEVATED PROSTATE SPECIFIC ANTIGEN 02/26/2009  . 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 FASCIITIS, PLANTAR 09/30/2007  . GERD 02/20/2007  . HYPERTENSION 02/20/2007  . HYPOKALEMIA 03/02/2009  . Obesity   . Diabetes mellitus   . Heart murmur   . Depression    AXIS IV:  other psychosocial or environmental problems, Familial stressors. AXIS V:  61-70 mild symptoms  Level of Care:  OP  Hospital Course:  Was diagnosed with Type II diabetes last July-August and shortly after told his [facility] that he was anxious. PCC prescribed klonopin.5mg  bid and wanted the patient to get a Psych eval at [facility]. Patient never followed up and his Klonopin Rx expired last week.Saturday he called 911 as he felt his heart racing again. He had a similar episode Friday which resolved spontaneously. He was also concerned that his sister's Boyfriend might be putting meds into his food. He has lived with his sister for several years. She met her BF when they both lived in a group home. Apparently they have a robust sexual life and try to limit their activity to when he is at work. While a patient in this hospital, he was started on Thorazine for anxiety and Tegretol for his mood and to alleviate any seizure associated with benzodiazepine withdrawal. His response to treatment regimen is evidenced by patient's report of improved mood and reduction of symptoms. He also received medication management and  monitoring for his other medical conditons.  He is currently discharged to his home with all personal belongings via family transport. He is to follow-up on outpatient psychiatric care at the [facility] with Willette Alma on 10/20/11  He is discharged with 2 weeks supply of medications.  Consults:  None  Significant Diagnostic Studies:  None  Discharge Vitals:   Blood pressure 151/63, pulse 87, temperature 98.5 F (36.9 C), temperature source Oral, resp. rate 16, height 5' 6.5" (1.689 m), weight 86.183 kg (190 lb).  Mental Status Exam: See Mental Status Examination and Suicide Risk Assessment completed by Attending Physician prior to discharge.  Discharge destination:  Home  Is patient on multiple antipsychotic therapies at discharge:  No   Has Patient had three or more failed trials of antipsychotic monotherapy by history:  No  Recommended Plan for Multiple Antipsychotic Therapies: NA  Discharge Orders    Future Appointments: Provider: Department: Dept Phone: Center:   02/05/2012 10:00 AM Rogelia Boga, MD [facility] [phone] [facility]     Medication List  As of 10/16/2011  2:01 PM   START taking these medications         carbamazepine 200 MG tablet   Commonly known as: TEGRETOL   Mood stabilizer    Provide 2 weeks supply please      escitalopram 10 MG tablet   Commonly known as: LEXAPRO   For depression    2 weeks supply sample         CONTINUE taking these medications  metFORMIN 500 MG 24 hr tablet   Commonly known as: GLUCOPHAGE-XR   Take 1 tablet (500 mg total) by mouth daily with breakfast.      nisoldipine 34 MG 24 hr tablet   Commonly known as: SULAR   Take 1 tablet (34 mg total) by mouth daily.      olmesartan-hydrochlorothiazide 40-25 MG per tablet   Commonly known as: BENICAR HCT   Take 1 tablet by mouth daily.      potassium chloride SA 20 MEQ tablet   Commonly known as: K-DUR,KLOR-CON   Take 1 tablet (20 mEq  total) by mouth daily.      tadalafil 20 MG tablet   Commonly known as: CIALIS          Where to get your medications    These are the prescriptions that you need to pick up.   You may get these medications from any pharmacy.         carbamazepine 200 MG tablet   escitalopram 10 MG tablet           Follow-up Information    Follow up with Willette Alma - [facility] on 10/20/2011. (Your appointment with Willette Alma, P.A. is scheduled for Friday, October 20, 2011 at 10 a.m.)    Contact information:   [address] [place], [place]  [address]   [phone]         Follow-up recommendations:  Other:  Follow-up with your out patient psychiatric care as recommended.  Comments:  Take your medications as prescribed.  SignedArmandina Stammer I 10/16/2011, 2:01 PM

## 2011-10-16 NOTE — Progress Notes (Signed)
Discharge Note:   Patient was given suicide prevent information, which was discussed with patient, stated he understood and has no questions.   Patient denied SI and HI.   Denied A/V hallucinations.   Denied pain.   Sister  picked patient up to go to home.  Patient requested letter to be sent to his employer so he can return to work on Wednesday, plans to go to doctor on Tuesday.   Patient received all his belonging, medications, prescriptions.   Patient has been cooperative and pleasant.   Stated he appreciates all the staff has done to assist him while at [facility].

## 2011-10-18 ENCOUNTER — Encounter: Payer: Self-pay | Admitting: Internal Medicine

## 2011-10-18 ENCOUNTER — Ambulatory Visit (INDEPENDENT_AMBULATORY_CARE_PROVIDER_SITE_OTHER): Payer: BC Managed Care – PPO | Admitting: Internal Medicine

## 2011-10-18 DIAGNOSIS — F419 Anxiety disorder, unspecified: Secondary | ICD-10-CM

## 2011-10-18 DIAGNOSIS — I10 Essential (primary) hypertension: Secondary | ICD-10-CM

## 2011-10-18 DIAGNOSIS — F411 Generalized anxiety disorder: Secondary | ICD-10-CM

## 2011-10-18 DIAGNOSIS — T424X5A Adverse effect of benzodiazepines, initial encounter: Secondary | ICD-10-CM

## 2011-10-18 DIAGNOSIS — E119 Type 2 diabetes mellitus without complications: Secondary | ICD-10-CM

## 2011-10-18 MED ORDER — POTASSIUM CHLORIDE CRYS ER 20 MEQ PO TBCR: 20.0000 meq | EXTENDED_RELEASE_TABLET | Freq: Two times a day (BID) | ORAL | Status: DC

## 2011-10-18 NOTE — Progress Notes (Signed)
Patient Discharge Instructions:  Admission Note Faxed,  10/17/2011 After Visit Summary Faxed,  10/17/2011 Faxed to the [facility] provider:  10/17/2011 D/C Summary Note faxed 10/17/2011 Facesheet faxed 10/17/2011  Faxed to [facility] - Willette Alma PA @ [phone]  Wandra Scot, 10/18/2011, 2:33 PM

## 2011-10-18 NOTE — Progress Notes (Signed)
  Subjective:    Patient ID: Javier [name], male    DOB: 07-30-1951, 61 y.o.   MRN: [id number]  HPI  61 year old patient who is seen today for followup following a hospital discharge. He has a history of diabetes as well as hypertension which have been well controlled on treatment. Hospital course was notable for mild hypokalemia. When last seen in the office his anxiety disorder was stable on Klonopin twice a day;  it was suggested that he give himself a trial of once daily Klonopin but to resume a twice a day regimen if he felt this dose was in adequate. Apparently he discontinued or ran out of the Klonopin and suffered a an acute anxiety episode. He was admitted to the hospital and did receive a psychiatric consultation. Presently he is on Tegretol and Lexapro and doing reasonably well. He is scheduled for psychiatric followup in 2 days. He still does not feel able to return to work or even drive a car due to  anxiety. A hemoglobin A1c was 5.6 during the hospital.  Hospital records reviewed A note to excuse from work written. His psychiatrist may consider a longer leave of absence    Review of Systems  Constitutional: Negative for fever, chills, appetite change and fatigue.  HENT: Negative for hearing loss, ear pain, congestion, sore throat, trouble swallowing, neck stiffness, dental problem, voice change and tinnitus.   Eyes: Negative for pain, discharge and visual disturbance.  Respiratory: Negative for cough, chest tightness, wheezing and stridor.   Cardiovascular: Negative for chest pain, palpitations and leg swelling.  Gastrointestinal: Negative for nausea, vomiting, abdominal pain, diarrhea, constipation, blood in stool and abdominal distention.  Genitourinary: Negative for urgency, hematuria, flank pain, discharge, difficulty urinating and genital sores.  Musculoskeletal: Negative for myalgias, back pain, joint swelling, arthralgias and gait problem.  Skin: Negative for rash.    Neurological: Negative for dizziness, syncope, speech difficulty, weakness, numbness and headaches.  Hematological: Negative for adenopathy. Does not bruise/bleed easily.  Psychiatric/Behavioral: Positive for behavioral problems, confusion, dysphoric mood, decreased concentration and agitation. The patient is not nervous/anxious.        Objective:   Physical Exam  Constitutional: He is oriented to person, place, and time. He appears well-developed.  HENT:  Head: Normocephalic.  Right Ear: External ear normal.  Left Ear: External ear normal.  Eyes: Conjunctivae and EOM are normal.  Neck: Normal range of motion.  Cardiovascular: Normal rate and normal heart sounds.   Pulmonary/Chest: Breath sounds normal.  Abdominal: Bowel sounds are normal.  Musculoskeletal: Normal range of motion. He exhibits no edema and no tenderness.  Neurological: He is alert and oriented to person, place, and time.  Psychiatric: He has a normal mood and affect. His behavior is normal.          Assessment & Plan:   Anxiety disorder. Aggravated by benzodiazepine withdrawal. Patient has a mood disorder and is now followed by psychiatry who will reassess the patient in 2 days. Medical regimen unchanged. A note to excuse from work given today Diabetes mellitus. Very tight control with weight loss and diet. We'll continue modest dose metformin and recheck in 3 months Hypertension well controlled History of hypokalemia. Will increase potassium supplement to a twice a day regimen  Recheck 3 months

## 2011-12-11 ENCOUNTER — Encounter: Payer: BC Managed Care – PPO | Attending: Internal Medicine | Admitting: *Deleted

## 2011-12-11 ENCOUNTER — Telehealth: Payer: Self-pay | Admitting: *Deleted

## 2011-12-11 ENCOUNTER — Telehealth: Payer: Self-pay | Admitting: Internal Medicine

## 2011-12-11 ENCOUNTER — Encounter: Payer: Self-pay | Admitting: *Deleted

## 2011-12-11 DIAGNOSIS — Z713 Dietary counseling and surveillance: Secondary | ICD-10-CM | POA: Insufficient documentation

## 2011-12-11 DIAGNOSIS — E119 Type 2 diabetes mellitus without complications: Secondary | ICD-10-CM | POA: Insufficient documentation

## 2011-12-11 MED ORDER — POTASSIUM CHLORIDE CRYS ER 20 MEQ PO TBCR: 20.0000 meq | EXTENDED_RELEASE_TABLET | Freq: Every day | ORAL | Status: DC

## 2011-12-11 NOTE — Patient Instructions (Signed)
Goals:  Follow Diabetes Meal Plan as instructed (yellow card).  Eat 3 meals and 2-3 snacks, or every 3-5 hrs  Add lean protein foods to meals/snacks  Aim for 30 min or more of physical activity daily  Updated Plan: (Maintenance Plan) 1800 calories  200 g carbs 135 g protein 50 g fat

## 2011-12-11 NOTE — Progress Notes (Signed)
Medical Nutrition Therapy:  Appt start time: 1200 end time:  1300.  Assessment:  T2DM, new onset.  Javier [name] returns today for follow up with an additional wt loss of 25 lbs.  Reports eating ~60 g CHO per meal, sometimes up to 70 g. Meals include rotissere chicken, [employer] salad & chili, [employer] 6 inch [place] sub. Pt recently checked pre-meal BG and reports 95 mg/dL. Unable to get much exercise d/t pain from past knee surgery.  Reports his MD instructed him to "save his walking for work".  Doing very well overall with no problems.   Lab Results  Component Value Date   HGBA1C 5.6 10/15/2011   MEDICATIONS: See medication list; reconciled with patient.   Usual physical activity: None since last month d/t aggravation of knee (had surgery last year) from walking daily. Walks a lot on his job; OK per MD.  Estimated energy needs: 1500-1600 calories 165-180 g carbohydrates 110-120 g protein 40-45 g fat  Maintenance energy needs:  1800 calories 200 g carbohydrates 135 g protein 50 g fat  Progress Towards Goal(s):  Resolved.   Nutritional Diagnosis:  [name]-2.1 Impaired nutrient utilization related to glucose metabolism as evidenced by a new diagnosis of T2DM and MD referral for DM education.    Intervention/Goals:  Follow Diabetes Meal Plan as instructed (yellow card).  Eat 3 meals and 2-3 snacks, or every 3-5 hrs  Add lean protein foods to meals/snacks  Aim for 30 min or more of physical activity daily  Monitoring/Evaluation:  Dietary intake, exercise, A1c and BG (as available), and body weight prn.

## 2011-12-11 NOTE — Telephone Encounter (Signed)
Patient called stating that he was on klorcon one a day and upon picking up rx it states two a day and the patient would like clarification. Please assist.

## 2011-12-11 NOTE — Telephone Encounter (Signed)
Attempt to call- VM - LMTCB if questions , gave dr. Vernon Prey instructions to take 1 tab qday - not 2. K[facility] Med list changed to reflect

## 2011-12-11 NOTE — Telephone Encounter (Signed)
Please advise - last K+ lab was 10/14/11 - 3.3 He was on 1 tab qd in the past - I cant truly find in the chart why it was increased

## 2011-12-11 NOTE — Telephone Encounter (Signed)
Pt needs to speak to Selena Batten re: his Potassium prescription.  He is afraid he may have the wrong directions on this last prescription

## 2011-12-11 NOTE — Telephone Encounter (Signed)
Pt called again said he took on potassium this morning and wanted to know if he should take a second one. Pt was advised that message has been sent to the doctor and he will receive a call back

## 2011-12-11 NOTE — Telephone Encounter (Signed)
There is another phone note r/t this same request - it has been sent to Dr. Amador Cunas to advise me about increase

## 2011-12-11 NOTE — Telephone Encounter (Signed)
OK potassium once daily

## 2011-12-12 ENCOUNTER — Telehealth: Payer: Self-pay | Admitting: Internal Medicine

## 2011-12-12 NOTE — Telephone Encounter (Signed)
Pt is questioning whether he take 1 or 2 potassium pills.

## 2011-12-12 NOTE — Telephone Encounter (Signed)
Spoke with Javier [name]- didn't get VM from yesterday - per dr. Amador Cunas - take only 1 pill daily

## 2011-12-13 ENCOUNTER — Telehealth: Payer: Self-pay

## 2011-12-13 NOTE — Telephone Encounter (Signed)
rov tomarrow 

## 2011-12-13 NOTE — Telephone Encounter (Signed)
Appt scheduled and pt notified.

## 2011-12-13 NOTE — Telephone Encounter (Signed)
Please call and make appt for tomorrow

## 2011-12-13 NOTE — Telephone Encounter (Signed)
Pt states last night his BP was elevated at 160/85.  Pt states he has been having a headache and blurred vision that started on Thursday.  Pt states today his bp has been fluctuating.  Pt is also taking a depression medication.  Pls advise.  Pt states if he needs to he can come in on tomorrow for an appt.

## 2011-12-14 ENCOUNTER — Encounter: Payer: Self-pay | Admitting: Internal Medicine

## 2011-12-14 ENCOUNTER — Ambulatory Visit (INDEPENDENT_AMBULATORY_CARE_PROVIDER_SITE_OTHER): Payer: BC Managed Care – PPO | Admitting: Internal Medicine

## 2011-12-14 VITALS — BP 140/80 | Wt 192.0 lb

## 2011-12-14 DIAGNOSIS — I10 Essential (primary) hypertension: Secondary | ICD-10-CM

## 2011-12-14 DIAGNOSIS — E119 Type 2 diabetes mellitus without complications: Secondary | ICD-10-CM

## 2011-12-14 MED ORDER — NEBIVOLOL HCL 5 MG PO TABS: 5.0000 mg | ORAL_TABLET | Freq: Every day | ORAL | Status: DC

## 2011-12-14 NOTE — Progress Notes (Signed)
  Subjective:    Patient ID: Javier [name], male    DOB: 06/19/1951, 61 y.o.   MRN: [id number]  HPI  61 year old patient who has treated hypertension. For the past couple weeks he is a consistently high readings. This has been confirmed the at psychiatry visits blood pressure readings have been consistently elevated especially systolic readings he complains of more severe headaches.  BP Readings from Last 3 Encounters:  12/14/11 140/80  10/18/11 132/88  10/15/11 151/63    Lab Results  Component Value Date   HGBA1C 5.6 10/15/2011    Review of Systems  Constitutional: Negative for fever, chills, appetite change and fatigue.  HENT: Negative for hearing loss, ear pain, congestion, sore throat, trouble swallowing, neck stiffness, dental problem, voice change and tinnitus.   Eyes: Negative for pain, discharge and visual disturbance.  Respiratory: Negative for cough, chest tightness, wheezing and stridor.   Cardiovascular: Negative for chest pain, palpitations and leg swelling.  Gastrointestinal: Negative for nausea, vomiting, abdominal pain, diarrhea, constipation, blood in stool and abdominal distention.  Genitourinary: Negative for urgency, hematuria, flank pain, discharge, difficulty urinating and genital sores.  Musculoskeletal: Negative for myalgias, back pain, joint swelling, arthralgias and gait problem.  Skin: Negative for rash.  Neurological: Positive for headaches. Negative for dizziness, syncope, speech difficulty, weakness and numbness.  Hematological: Negative for adenopathy. Does not bruise/bleed easily.  Psychiatric/Behavioral: Negative for behavioral problems and dysphoric mood. The patient is not nervous/anxious.        Objective:   Physical Exam  Constitutional: He is oriented to person, place, and time. He appears well-developed.       Blood pressure  160- 164/80- 84  HENT:  Head: Normocephalic.  Right Ear: External ear normal.  Left Ear: External ear normal.   Eyes: Conjunctivae and EOM are normal.  Neck: Normal range of motion.  Cardiovascular: Normal rate and normal heart sounds.   Pulmonary/Chest: Breath sounds normal.  Abdominal: Bowel sounds are normal.  Musculoskeletal: Normal range of motion. He exhibits no edema and no tenderness.  Neurological: He is alert and oriented to person, place, and time.  Psychiatric: He has a normal mood and affect. His behavior is normal.          Assessment & Plan:   Hypertension suboptimal control.  We'll give samples of Tribenzor 40-5-25 and Bystolic 5 mg daily. Recheck 3 weeks

## 2011-12-14 NOTE — Patient Instructions (Signed)
Please check your blood pressure on a regular basis.  If it is consistently greater than 150/90, please make an office appointment.  Limit your sodium (Salt) intake    It is important that you exercise regularly, at least 20 minutes 3 to 4 times per week.  If you develop chest pain or shortness of breath seek  medical attention.   

## 2011-12-18 ENCOUNTER — Telehealth: Payer: Self-pay | Admitting: Internal Medicine

## 2011-12-18 NOTE — Telephone Encounter (Signed)
Pt called and said that new bp med is not working. Pt says that bp is not stable. Last reading was 184/79. Pt is wondering if med needs to be changed?

## 2011-12-18 NOTE — Telephone Encounter (Signed)
Spoke with Javier [name]- infomed of dr. Vernon Prey instructions - asked to keep me informed

## 2011-12-18 NOTE — Telephone Encounter (Signed)
Continue home monitoring;  office visit Thursday or Friday of this week he

## 2011-12-18 NOTE — Telephone Encounter (Signed)
Please advise 

## 2012-01-01 ENCOUNTER — Ambulatory Visit (INDEPENDENT_AMBULATORY_CARE_PROVIDER_SITE_OTHER): Payer: BC Managed Care – PPO | Admitting: Internal Medicine

## 2012-01-01 ENCOUNTER — Encounter: Payer: Self-pay | Admitting: Internal Medicine

## 2012-01-01 VITALS — BP 130/80 | Temp 98.5°F | Wt 190.0 lb

## 2012-01-01 DIAGNOSIS — I10 Essential (primary) hypertension: Secondary | ICD-10-CM

## 2012-01-01 MED ORDER — OLMESARTAN-AMLODIPINE-HCTZ 40-10-25 MG PO TABS: 1.0000 | ORAL_TABLET | ORAL | Status: DC

## 2012-01-01 MED ORDER — NEBIVOLOL HCL 10 MG PO TABS: 10.0000 mg | ORAL_TABLET | Freq: Every day | ORAL | Status: DC

## 2012-01-01 NOTE — Progress Notes (Signed)
  Subjective:    Patient ID: Javier [name], male    DOB: 02-Feb-1951, 61 y.o.   MRN: [id number]  HPI 61 year old patient who is seen today for followup of hypertension. His medications were applied fitted last visit. Today he feels well and has had no further headaches. He continues to monitor her home blood pressure readings with consistently high the systolic readings using the 150- 160 range. In general he feels well     Review of Systems  Constitutional: Negative.        Objective:   Physical Exam  Constitutional: He appears well-developed and well-nourished. No distress.       Blood pressure consistent in the 170-180 systolic over 80 diastolic range          Assessment & Plan:  Hypertension suboptimal control Will up titrate medications  Recheck 3 weeks

## 2012-01-01 NOTE — Patient Instructions (Signed)
Limit your sodium (Salt) intake  Please check your blood pressure on a regular basis.  If it is consistently greater than 150/90, please make an office appointment.   

## 2012-02-05 ENCOUNTER — Encounter: Payer: Self-pay | Admitting: Internal Medicine

## 2012-02-05 ENCOUNTER — Ambulatory Visit (INDEPENDENT_AMBULATORY_CARE_PROVIDER_SITE_OTHER): Payer: BC Managed Care – PPO | Admitting: Internal Medicine

## 2012-02-05 VITALS — BP 148/90 | Temp 98.2°F | Wt 185.0 lb

## 2012-02-05 DIAGNOSIS — E119 Type 2 diabetes mellitus without complications: Secondary | ICD-10-CM

## 2012-02-05 DIAGNOSIS — I10 Essential (primary) hypertension: Secondary | ICD-10-CM

## 2012-02-05 MED ORDER — CLONIDINE HCL 0.1 MG PO TABS: 0.1000 mg | ORAL_TABLET | Freq: Three times a day (TID) | ORAL | Status: DC

## 2012-02-05 MED ORDER — CLONIDINE HCL 0.1 MG PO TABS: ORAL_TABLET | ORAL | Status: DC

## 2012-02-05 MED ORDER — NEBIVOLOL HCL 20 MG PO TABS: 1.0000 | ORAL_TABLET | ORAL | Status: DC

## 2012-02-05 NOTE — Patient Instructions (Signed)
Limit your sodium (Salt) intake    It is important that you exercise regularly, at least 20 minutes 3 to 4 times per week.  If you develop chest pain or shortness of breath seek  medical attention.  Please check your blood pressure on a regular basis.  If it is consistently greater than 150/90, please make an office appointment.  

## 2012-02-05 NOTE — Progress Notes (Signed)
  Subjective:    Patient ID: Javier [name], male    DOB: 21-Feb-1951, 61 y.o.   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 He has been compliant with his medications. Doing well today without concerns or complaints.    Review of Systems  Constitutional: Negative for fever, chills, appetite change and fatigue.  HENT: Negative for hearing loss, ear pain, congestion, sore throat, trouble swallowing, neck stiffness, dental problem, voice change and tinnitus.   Eyes: Negative for pain, discharge and visual disturbance.  Respiratory: Negative for cough, chest tightness, wheezing and stridor.   Cardiovascular: Negative for chest pain, palpitations and leg swelling.  Gastrointestinal: Negative for nausea, vomiting, abdominal pain, diarrhea, constipation, blood in stool and abdominal distention.  Genitourinary: Negative for urgency, hematuria, flank pain, discharge, difficulty urinating and genital sores.  Musculoskeletal: Negative for myalgias, back pain, joint swelling, arthralgias and gait problem.  Skin: Negative for rash.  Neurological: Negative for dizziness, syncope, speech difficulty, weakness, numbness and headaches.  Hematological: Negative for adenopathy. Does not bruise/bleed easily.  Psychiatric/Behavioral: Negative for behavioral problems and dysphoric mood. The patient is not nervous/anxious.        Objective:   Physical Exam  Constitutional: He is oriented to person, place, and time. He appears well-developed and well-nourished. No distress.       160- to 170/70  HENT:  Head: Normocephalic.  Right Ear: External ear normal.  Left Ear: External ear normal.  Eyes: Conjunctivae and EOM are normal.  Neck: Normal range of motion.  Cardiovascular: Normal rate and normal heart sounds.   Pulmonary/Chest: Breath sounds normal.  Abdominal:  Bowel sounds are normal.  Musculoskeletal: Normal range of motion. He exhibits no edema and no tenderness.  Neurological: He is alert and oriented to person, place, and time.  Psychiatric: He has a normal mood and affect. His behavior is normal.          Assessment & Plan:   Hypertension. Elevated systolic reading. I wonder if Tegretol which is a enzyme inducer and result in rapid metabolism of his antihypertensive drugs. Will increase Bystolic . Recheck 1 month  to 20 mg daily and add Catapres at bedtime  Diabetes. We'll recheck a hemoglobin A1c

## 2012-02-15 ENCOUNTER — Other Ambulatory Visit: Payer: Self-pay

## 2012-02-15 ENCOUNTER — Other Ambulatory Visit: Payer: Self-pay | Admitting: Internal Medicine

## 2012-02-15 MED ORDER — NEBIVOLOL HCL 20 MG PO TABS: 20.0000 mg | ORAL_TABLET | Freq: Every day | ORAL | Status: DC

## 2012-03-04 ENCOUNTER — Ambulatory Visit (INDEPENDENT_AMBULATORY_CARE_PROVIDER_SITE_OTHER): Payer: BC Managed Care – PPO | Admitting: Internal Medicine

## 2012-03-04 ENCOUNTER — Encounter: Payer: Self-pay | Admitting: Internal Medicine

## 2012-03-04 VITALS — BP 150/90 | Temp 98.4°F | Wt 186.0 lb

## 2012-03-04 DIAGNOSIS — I10 Essential (primary) hypertension: Secondary | ICD-10-CM

## 2012-03-04 DIAGNOSIS — E119 Type 2 diabetes mellitus without complications: Secondary | ICD-10-CM

## 2012-03-04 NOTE — Patient Instructions (Signed)
Limit your sodium (Salt) intake    It is important that you exercise regularly, at least 20 minutes 3 to 4 times per week.  If you develop chest pain or shortness of breath seek  medical attention.  DASH Diet The DASH diet stands for "Dietary Approaches to Stop Hypertension." It is a healthy eating plan that has been shown to reduce high blood pressure (hypertension) in as little as 14 days, while also possibly providing other significant health benefits. These other health benefits include reducing the risk of breast cancer after menopause and reducing the risk of type 2 diabetes, heart disease, colon cancer, and stroke. Health benefits also include weight loss and slowing kidney failure in patients with chronic kidney disease.   DIET GUIDELINES  Limit salt (sodium). Your diet should contain less than 1500 mg of sodium daily.   Limit refined or processed carbohydrates. Your diet should include mostly whole grains. Desserts and added sugars should be used sparingly.   Include small amounts of heart-healthy fats. These types of fats include nuts, oils, and tub margarine. Limit saturated and trans fats. These fats have been shown to be harmful in the body.  CHOOSING FOODS   The following food groups are based on a 2000 calorie diet. See your Registered Dietitian for individual calorie needs. Grains and Grain Products (6 to 8 servings daily)  Eat More Often: Whole-wheat bread, brown rice, whole-grain or wheat pasta, quinoa, popcorn without added fat or salt (air popped).   Eat Less Often: White bread, white pasta, white rice, cornbread.  Vegetables (4 to 5 servings daily)  Eat More Often: Fresh, frozen, and canned vegetables. Vegetables may be raw, steamed, roasted, or grilled with a minimal amount of fat.   Eat Less Often/Avoid: Creamed or fried vegetables. Vegetables in a cheese sauce.  Fruit (4 to 5 servings daily)  Eat More Often: All fresh, canned (in natural juice), or frozen fruits.  Dried fruits without added sugar. One hundred percent fruit juice ( cup [237 mL] daily).   Eat Less Often: Dried fruits with added sugar. Canned fruit in light or heavy syrup.  Lean Meats, Fish, and Poultry (2 servings or less daily. One serving is 3 to 4 oz [85-114 g]).  Eat More Often: Ninety percent or leaner ground beef, tenderloin, sirloin. Round cuts of beef, chicken breast, turkey breast. All fish. Grill, bake, or broil your meat. Nothing should be fried.   Eat Less Often/Avoid: Fatty cuts of meat, turkey, or chicken leg, thigh, or wing. Fried cuts of meat or fish.  Dairy (2 to 3 servings)  Eat More Often: Low-fat or fat-free milk, low-fat plain or light yogurt, reduced-fat or part-skim cheese.   Eat Less Often/Avoid: Milk (whole, 2%, skim, or chocolate). Whole milk yogurt. Full-fat cheeses.  Nuts, Seeds, and Legumes (4 to 5 servings per week)  Eat More Often: All without added salt.   Eat Less Often/Avoid: Salted nuts and seeds, canned beans with added salt.  Fats and Sweets (limited)  Eat More Often: Vegetable oils, tub margarines without trans fats, sugar-free gelatin. Mayonnaise and salad dressings.   Eat Less Often/Avoid: Coconut oils, palm oils, butter, stick margarine, cream, half and half, cookies, candy, pie.  FOR MORE INFORMATION The Dash Diet Eating Plan: www.dashdiet.org Document Released: 08/24/2011 Document Reviewed: 08/14/2011 [employer] Patient Information 2012 [employer]. 

## 2012-03-04 NOTE — Progress Notes (Signed)
  Subjective:    Patient ID: Javier [name], male    DOB: [birth year], 61 y.o.   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 Catapres was added at bedtime and Bystolic  was increased to 20 mg last visit. He feels well on his medical regimen he is being followed by psychiatry and treatment includes Tegretol which may be a factor with his multi-drug-resistant hypertension He has diabetes which has been under excellent control on low-dose metformin therapy. No recent hemoglobin A1c however    Review of Systems  Constitutional: Negative for fever, chills, appetite change and fatigue.  HENT: Negative for hearing loss, ear pain, congestion, sore throat, trouble swallowing, neck stiffness, dental problem, voice change and tinnitus.   Eyes: Negative for pain, discharge and visual disturbance.  Respiratory: Negative for cough, chest tightness, wheezing and stridor.   Cardiovascular: Negative for chest pain, palpitations and leg swelling.  Gastrointestinal: Negative for nausea, vomiting, abdominal pain, diarrhea, constipation, blood in stool and abdominal distention.  Genitourinary: Negative for urgency, hematuria, flank pain, discharge, difficulty urinating and genital sores.  Musculoskeletal: Negative for myalgias, back pain, joint swelling, arthralgias and gait problem.  Skin: Negative for rash.  Neurological: Negative for dizziness, syncope, speech difficulty, weakness, numbness and headaches.  Hematological: Negative for adenopathy. Does not bruise/bleed easily.  Psychiatric/Behavioral: Negative for behavioral problems and dysphoric mood. The patient is not nervous/anxious.        Objective:   Physical Exam  Constitutional: He is oriented to person, place, and time. He appears well-developed.  HENT:  Head: Normocephalic.  Right Ear: External ear normal.  Left Ear: External ear normal.  Eyes: Conjunctivae and EOM are  normal.  Neck: Normal range of motion.  Cardiovascular: Normal rate and normal heart sounds.   Pulmonary/Chest: Breath sounds normal.  Abdominal: Bowel sounds are normal.  Musculoskeletal: Normal range of motion. He exhibits no edema and no tenderness.  Neurological: He is alert and oriented to person, place, and time.  Psychiatric: He has a normal mood and affect. His behavior is normal.          Assessment & Plan:   Hypertension. Reasonable control repeat blood pressure 150/70 in both arms. We'll continue present regimen Diabetes. We'll check a hemoglobin A1c  Recheck 3 months

## 2012-04-08 ENCOUNTER — Encounter: Payer: Self-pay | Admitting: Internal Medicine

## 2012-04-08 ENCOUNTER — Ambulatory Visit (INDEPENDENT_AMBULATORY_CARE_PROVIDER_SITE_OTHER): Payer: BC Managed Care – PPO | Admitting: Internal Medicine

## 2012-04-08 ENCOUNTER — Telehealth: Payer: Self-pay | Admitting: Internal Medicine

## 2012-04-08 VITALS — BP 170/80 | Temp 98.4°F | Wt 188.0 lb

## 2012-04-08 DIAGNOSIS — F419 Anxiety disorder, unspecified: Secondary | ICD-10-CM

## 2012-04-08 DIAGNOSIS — E119 Type 2 diabetes mellitus without complications: Secondary | ICD-10-CM

## 2012-04-08 DIAGNOSIS — N529 Male erectile dysfunction, unspecified: Secondary | ICD-10-CM

## 2012-04-08 DIAGNOSIS — F411 Generalized anxiety disorder: Secondary | ICD-10-CM

## 2012-04-08 DIAGNOSIS — I10 Essential (primary) hypertension: Secondary | ICD-10-CM

## 2012-04-08 MED ORDER — TRIAMCINOLONE ACETONIDE 0.1 % EX CREA: TOPICAL_CREAM | Freq: Two times a day (BID) | CUTANEOUS | Status: DC

## 2012-04-08 MED ORDER — SILDENAFIL CITRATE 100 MG PO TABS: 50.0000 mg | ORAL_TABLET | Freq: Every day | ORAL | Status: DC | PRN

## 2012-04-08 NOTE — Telephone Encounter (Signed)
Pt contacted, appt made, pt aware.

## 2012-04-08 NOTE — Patient Instructions (Signed)
Limit your sodium (Salt) intake   Please check your hemoglobin A1c every 3 months    It is important that you exercise regularly, at least 20 minutes 3 to 4 times per week.  If you develop chest pain or shortness of breath seek  medical attention.   

## 2012-04-08 NOTE — Telephone Encounter (Signed)
Caller: [place]/Patient; PCP: Eleonore Chiquito; CB#: [phone];  Call regarding Rash/Hives that is on his forearms and on back.  This stated after starting Clonidine.   No resp distress . Pt just wants an appt AS HE IS OFF TODAY 04/08/12.  No Appts seen in [facility].  Please call pt back to advise.

## 2012-04-08 NOTE — Progress Notes (Signed)
  Subjective:    Patient ID: Leanora Cover, male    DOB: Mar 10, 1951, 61 y.o.   MRN: [id number]  HPI  61 year old patient who is complaining of a pruritic rash involving both antecubital fossa and also his right upper back. He was concerned about possibly Catapres causes an allergic reaction. He has a history of ADD and is requesting samples. History of hypertension and diabetes which have been stable.    Review of Systems  Skin: Positive for rash.       Objective:   Physical Exam  Constitutional: He appears well-developed and well-nourished. No distress.  Skin: No rash noted.          Assessment & Plan:   History of rash. No discernible rash at this time. Probable a heat rash doubt drug intolerance Blood pressure 160/65. We'll continue his present regimen. Compliance stressed  We'll give a prescription for triamcinolone cream if needed Return in 2 or 3 months for followup. Samples provided

## 2012-06-03 ENCOUNTER — Ambulatory Visit (INDEPENDENT_AMBULATORY_CARE_PROVIDER_SITE_OTHER): Payer: BC Managed Care – PPO | Admitting: Internal Medicine

## 2012-06-03 ENCOUNTER — Encounter: Payer: Self-pay | Admitting: Internal Medicine

## 2012-06-03 VITALS — BP 190/80 | Temp 98.1°F | Wt 187.0 lb

## 2012-06-03 DIAGNOSIS — F419 Anxiety disorder, unspecified: Secondary | ICD-10-CM

## 2012-06-03 DIAGNOSIS — E119 Type 2 diabetes mellitus without complications: Secondary | ICD-10-CM

## 2012-06-03 DIAGNOSIS — I10 Essential (primary) hypertension: Secondary | ICD-10-CM

## 2012-06-03 DIAGNOSIS — E876 Hypokalemia: Secondary | ICD-10-CM

## 2012-06-03 DIAGNOSIS — Z23 Encounter for immunization: Secondary | ICD-10-CM

## 2012-06-03 DIAGNOSIS — F411 Generalized anxiety disorder: Secondary | ICD-10-CM

## 2012-06-03 LAB — MICROALBUMIN / CREATININE URINE RATIO
Creatinine,U: 54.2 mg/dL
Microalb Creat Ratio: 0.6 mg/g (ref 0.0–30.0)

## 2012-06-03 LAB — BASIC METABOLIC PANEL
CO2: 32 mEq/L (ref 19–32)
Chloride: 101 mEq/L (ref 96–112)
Potassium: 4.2 mEq/L (ref 3.5–5.1)
Sodium: 140 mEq/L (ref 135–145)

## 2012-06-03 MED ORDER — CLONIDINE HCL 0.1 MG PO TABS: ORAL_TABLET | ORAL | Status: DC

## 2012-06-03 MED ORDER — AMLODIPINE BESYLATE 5 MG PO TABS: 5.0000 mg | ORAL_TABLET | Freq: Every day | ORAL | Status: DC

## 2012-06-03 NOTE — Patient Instructions (Signed)
Limit your sodium (Salt) intake    It is important that you exercise regularly, at least 20 minutes 3 to 4 times per week.  If you develop chest pain or shortness of breath seek  medical attention.  Return in one month for follow-up  

## 2012-06-03 NOTE — Progress Notes (Signed)
Subjective:    Patient ID: Javier [name], male    DOB: 1951/07/11, 61 y.o.   MRN: [id number]  HPI  61 year old patient who is seen today for followup of his hypertension. 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 This occurred in spite of taking 0.2 Catapres the night before his DOT exam. His usual doses 0 point 1 at night. He generally feels well. He has been followed by psychiatry and is on Tegretol for mood disorder. He has been felt that as an enzyme inducer this medication may be a factor in his difficulty control hypertension. He does attempt to control his salt intake. He was also noted to have slight proteinuria on a recent DOT physical Medical problems include type 2 diabetes which has been well-controlled on metformin.  He is requesting a letter to excuse from jury service  Past Medical History  Diagnosis Date  . BENIGN PROSTATIC HYPERTROPHY 02/20/2007  . ELEVATED PROSTATE SPECIFIC ANTIGEN 02/26/2009  . 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 FASCIITIS, PLANTAR 09/30/2007  . GERD 02/20/2007  . HYPERTENSION 02/20/2007  . HYPOKALEMIA 03/02/2009  . Obesity   . Diabetes mellitus   . Heart murmur   . Depression     History   Social History  . Marital Status: Married    Spouse Name: N/A    Number of Children: N/A  . Years of Education: N/A   Occupational History  . Not on file.   Social History Main Topics  . Smoking status: Never Smoker   . Smokeless tobacco: Never Used  . Alcohol Use: No  . Drug Use: No  . Sexually Active: Not on file   Other Topics Concern  . Not on file   Social History Narrative  . No narrative on file    Past Surgical History  Procedure Date  . Knee surgery     left    No family history on file.  No Known Allergies  Current Outpatient Prescriptions on File Prior to Visit  Medication Sig Dispense Refill  . carbamazepine (TEGRETOL) 200 MG tablet Take 1 tablet (200 mg total) by mouth 3 (three) times daily.  Mood stabilizer  Provide 2 weeks supply please  90 tablet  0  . cloNIDine (CATAPRES) 0.1 MG tablet 1 tablet at bedtime  90 tablet  3  . escitalopram (LEXAPRO) 10 MG tablet Take 1 tablet (10 mg total) by mouth daily. For depression  2 weeks supply sample  30 tablet  0  . metFORMIN (GLUCOPHAGE XR) 500 MG 24 hr tablet Take 1 tablet (500 mg total) by mouth daily with breakfast. For control of blood sugar  30 tablet  11  . Nebivolol HCl (BYSTOLIC) 20 MG TABS Take 1 tablet (20 mg total) by mouth daily.  30 tablet  11  . Olmesartan-Amlodipine-HCTZ (TRIBENZOR) 40-10-25 MG TABS Take 1 tablet by mouth 1 day or 1 dose.  30 tablet  2  . potassium chloride SA (KLOR-CON M20) 20 MEQ tablet Take 1 tablet (20 mEq total) by mouth daily. For potassium replacement  90 tablet  3  . tadalafil (CIALIS) 20 MG tablet Take 1 tablet (20 mg total) by mouth daily as needed. For Erectile Dysfunction  10 tablet  0  . triamcinolone cream (KENALOG) 0.1 % Apply topically 2 (two) times daily.  30 g  0  . DISCONTD: sildenafil (VIAGRA) 100 MG tablet Take 0.5-1 tablets (50-100 mg total) by mouth daily as needed for erectile dysfunction.  3  tablet  11    BP 190/80  Temp 98.1 F (36.7 C) (Oral)  Wt 187 lb (84.823 kg)       Review of Systems  Constitutional: Negative for fever, chills, appetite change and fatigue.  HENT: Negative for hearing loss, ear pain, congestion, sore throat, trouble swallowing, neck stiffness, dental problem, voice change and tinnitus.   Eyes: Negative for pain, discharge and visual disturbance.  Respiratory: Negative for cough, chest tightness, wheezing and stridor.   Cardiovascular: Negative for chest pain, palpitations and leg swelling.  Gastrointestinal: Negative for nausea, vomiting, abdominal pain, diarrhea, constipation, blood in stool and abdominal distention.  Genitourinary: Negative for urgency, hematuria, flank pain, discharge, difficulty urinating and genital sores.  Musculoskeletal:  Negative for myalgias, back pain, joint swelling, arthralgias and gait problem.  Skin: Negative for rash.  Neurological: Positive for headaches. Negative for dizziness, syncope, speech difficulty, weakness and numbness.  Hematological: Negative for adenopathy. Does not bruise/bleed easily.  Psychiatric/Behavioral: Positive for behavioral problems. Negative for dysphoric mood. The patient is not nervous/anxious.        Objective:   Physical Exam  Constitutional: He is oriented to person, place, and time. He appears well-developed.       Blood pressure 200/80  HENT:  Head: Normocephalic.  Right Ear: External ear normal.  Left Ear: External ear normal.  Eyes: Conjunctivae normal and EOM are normal.  Neck: Normal range of motion.  Cardiovascular: Normal rate.   Murmur heard. Pulmonary/Chest: Breath sounds normal.  Abdominal: Bowel sounds are normal.  Musculoskeletal: Normal range of motion. He exhibits no edema and no tenderness.  Neurological: He is alert and oriented to person, place, and time.  Psychiatric: He has a normal mood and affect. His behavior is normal.          Assessment & Plan:   Hypertension. Poor control systolic readings. Tegretol probably a factor as an enzyme inducer. Will increase amlodipine to 15 mg daily and increase Catapres to 0.3 mg daily in divided dosages Recheck 4 weeks Diabetes mellitus. Will check a hemoglobin A1c we'll also check for microalbumin Dyslipidemia stable Mood disorder stable on Tegretol

## 2012-07-01 ENCOUNTER — Ambulatory Visit (INDEPENDENT_AMBULATORY_CARE_PROVIDER_SITE_OTHER): Payer: BC Managed Care – PPO | Admitting: Internal Medicine

## 2012-07-01 ENCOUNTER — Encounter: Payer: Self-pay | Admitting: Internal Medicine

## 2012-07-01 VITALS — BP 160/80 | Temp 98.0°F | Wt 189.0 lb

## 2012-07-01 DIAGNOSIS — I10 Essential (primary) hypertension: Secondary | ICD-10-CM

## 2012-07-01 MED ORDER — CLONIDINE HCL 0.2 MG PO TABS: 0.2000 mg | ORAL_TABLET | Freq: Two times a day (BID) | ORAL | Status: DC

## 2012-07-01 MED ORDER — AMLODIPINE BESYLATE 10 MG PO TABS: 10.0000 mg | ORAL_TABLET | Freq: Every day | ORAL | Status: DC

## 2012-07-01 NOTE — Progress Notes (Signed)
  Subjective:    Patient ID: Javier [name], male    DOB: [birth year], 61 y.o.   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 He also has been treated with Tegretol for a mood disorder.  He is seen today to reassess his blood pressure. He does bring his home blood pressure cuff and there was good correlation between his blood pressure monitor and our blood pressure readings here. Blood pressure was in the range of 160/70 with a pulse rate in the high 50s. He's had a difficult time passing a DOT physical the 2 elevated systolic readings. It has been felt to Tegretol as an enzyme inducer has been a factor with the resistant blood pressure control. He has been taken a total of 15 mg of amlodipine daily    Review of Systems  Constitutional: Negative for fever, chills, appetite change and fatigue.  HENT: Negative for hearing loss, ear pain, congestion, sore throat, trouble swallowing, neck stiffness, dental problem, voice change and tinnitus.   Eyes: Negative for pain, discharge and visual disturbance.  Respiratory: Negative for cough, chest tightness, wheezing and stridor.   Cardiovascular: Negative for chest pain, palpitations and leg swelling.  Gastrointestinal: Negative for nausea, vomiting, abdominal pain, diarrhea, constipation, blood in stool and abdominal distention.  Genitourinary: Negative for urgency, hematuria, flank pain, discharge, difficulty urinating and genital sores.  Musculoskeletal: Negative for myalgias, back pain, joint swelling, arthralgias and gait problem.  Skin: Negative for rash.  Neurological: Negative for dizziness, syncope, speech difficulty, weakness, numbness and headaches.  Hematological: Negative for adenopathy. Does not bruise/bleed easily.  Psychiatric/Behavioral: Negative for behavioral problems and dysphoric mood. The patient is not nervous/anxious.        Objective:   Physical Exam  Constitutional: He appears  well-developed and well-nourished. No distress.       Blood pressure 160/70 pulse rate 58          Assessment & Plan:   Hypertension. Multidrug-resistant/Tegretol therapy also a likely factor. Will increase Catapres to 0.2 mg twice a day and amlodipine to 20 mg daily   Recheck 6 weeks

## 2012-07-01 NOTE — Patient Instructions (Signed)
Limit your sodium (Salt) intake  Please check your blood pressure on a regular basis.  If it is consistently greater than 150/90, please make an office appointment.     It is important that you exercise regularly, at least 20 minutes 3 to 4 times per week.  If you develop chest pain or shortness of breath seek  medical attention. 

## 2012-08-12 ENCOUNTER — Ambulatory Visit (INDEPENDENT_AMBULATORY_CARE_PROVIDER_SITE_OTHER): Payer: BC Managed Care – PPO | Admitting: Internal Medicine

## 2012-08-12 ENCOUNTER — Encounter: Payer: Self-pay | Admitting: Internal Medicine

## 2012-08-12 VITALS — BP 146/80 | HR 56 | Temp 98.4°F | Resp 18 | Wt 193.0 lb

## 2012-08-12 DIAGNOSIS — E119 Type 2 diabetes mellitus without complications: Secondary | ICD-10-CM

## 2012-08-12 DIAGNOSIS — I10 Essential (primary) hypertension: Secondary | ICD-10-CM

## 2012-08-12 NOTE — Progress Notes (Signed)
Subjective:    Patient ID: Javier [name], male    DOB: May 04, 1951, 61 y.o.   MRN: [id number]  HPI   61 year old patient who is seen today for followup of his hypertension and type 2 diabetes. Since his last visit here, Tegretol therapy has been discontinued. He has a history of multi-drug-resistant hypertension felt to be influenced by this enzyme inducer. He feels well home blood pressure readings have been nicely controlled. Last hemoglobin A1c less than 6  Past Medical History  Diagnosis Date  . BENIGN PROSTATIC HYPERTROPHY 02/20/2007  . ELEVATED PROSTATE SPECIFIC ANTIGEN 02/26/2009  . 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 FASCIITIS, PLANTAR 09/30/2007  . GERD 02/20/2007  . HYPERTENSION 02/20/2007  . HYPOKALEMIA 03/02/2009  . Obesity   . Diabetes mellitus   . Heart murmur   . Depression     History   Social History  . Marital Status: Married    Spouse Name: N/A    Number of Children: N/A  . Years of Education: N/A   Occupational History  . Not on file.   Social History Main Topics  . Smoking status: Never Smoker   . Smokeless tobacco: Never Used  . Alcohol Use: No  . Drug Use: No  . Sexually Active: Not on file   Other Topics Concern  . Not on file   Social History Narrative  . No narrative on file    Past Surgical History  Procedure Date  . Knee surgery     left    No family history on file.  No Known Allergies  Current Outpatient Prescriptions on File Prior to Visit  Medication Sig Dispense Refill  . amLODipine (NORVASC) 10 MG tablet Take 1 tablet (10 mg total) by mouth daily.  90 tablet  3  . cloNIDine (CATAPRES) 0.2 MG tablet Take 1 tablet (0.2 mg total) by mouth 2 (two) times daily.  180 tablet  6  . escitalopram (LEXAPRO) 10 MG tablet Take 1 tablet (10 mg total) by mouth daily. For depression  2 weeks supply sample  30 tablet  0  . metFORMIN (GLUCOPHAGE XR) 500 MG 24 hr tablet Take 1 tablet (500 mg total) by mouth daily with breakfast. For control  of blood sugar  30 tablet  11  . Nebivolol HCl (BYSTOLIC) 20 MG TABS Take 1 tablet (20 mg total) by mouth daily.  30 tablet  11  . Olmesartan-Amlodipine-HCTZ (TRIBENZOR) 40-10-25 MG TABS Take 1 tablet by mouth 1 day or 1 dose.  30 tablet  2  . potassium chloride SA (KLOR-CON M20) 20 MEQ tablet Take 1 tablet (20 mEq total) by mouth daily. For potassium replacement  90 tablet  3  . sildenafil (VIAGRA) 100 MG tablet Take 50-100 mg by mouth daily as needed.      . tadalafil (CIALIS) 20 MG tablet Take 1 tablet (20 mg total) by mouth daily as needed. For Erectile Dysfunction  10 tablet  0  . triamcinolone cream (KENALOG) 0.1 % Apply topically 2 (two) times daily.  30 g  0  . carbamazepine (TEGRETOL) 200 MG tablet Take 1 tablet (200 mg total) by mouth 3 (three) times daily. Mood stabilizer  Provide 2 weeks supply please  90 tablet  0    BP 146/80  Pulse 56  Temp 98.4 F (36.9 C) (Oral)  Resp 18  Wt 193 lb (87.544 kg)  SpO2 98%       Review of Systems  Constitutional: Negative for fever, chills, appetite change  and fatigue.  HENT: Negative for hearing loss, ear pain, congestion, sore throat, trouble swallowing, neck stiffness, dental problem, voice change and tinnitus.   Eyes: Negative for pain, discharge and visual disturbance.  Respiratory: Negative for cough, chest tightness, wheezing and stridor.   Cardiovascular: Negative for chest pain, palpitations and leg swelling.  Gastrointestinal: Negative for nausea, vomiting, abdominal pain, diarrhea, constipation, blood in stool and abdominal distention.  Genitourinary: Negative for urgency, hematuria, flank pain, discharge, difficulty urinating and genital sores.  Musculoskeletal: Negative for myalgias, back pain, joint swelling, arthralgias and gait problem.  Skin: Negative for rash.  Neurological: Negative for dizziness, syncope, speech difficulty, weakness, numbness and headaches.  Hematological: Negative for adenopathy. Does not  bruise/bleed easily.  Psychiatric/Behavioral: Negative for behavioral problems and dysphoric mood. The patient is not nervous/anxious.        Objective:   Physical Exam  Constitutional: He appears well-developed and well-nourished. No distress.       Blood pressure 150/74  Cardiovascular: Normal rate and regular rhythm.   Pulmonary/Chest: Effort normal.          Assessment & Plan:  Hypertension controlled. We'll continue home blood pressure monitoring if systolic reading is less than 409 we'll discontinue additional amlodipine. Recheck 3 months Diabetes mellitus stable with hemoglobin A1c 5.5

## 2012-08-12 NOTE — Patient Instructions (Signed)
Limit your sodium (Salt) intake  Please check your blood pressure on a regular basis.  If it is consistently greater than 150/90, please make an office appointment.  Return in 3 months for follow-up  

## 2012-08-16 ENCOUNTER — Other Ambulatory Visit: Payer: Self-pay | Admitting: Internal Medicine

## 2012-10-28 ENCOUNTER — Telehealth: Payer: Self-pay | Admitting: Internal Medicine

## 2012-10-28 MED ORDER — METFORMIN HCL ER 500 MG PO TB24: 500.0000 mg | ORAL_TABLET | Freq: Every day | ORAL | Status: DC

## 2012-10-28 NOTE — Telephone Encounter (Deleted)
Pt needs refill of this med. Pt is out and needs asap. [facility]

## 2012-10-28 NOTE — Telephone Encounter (Signed)
Pt needs refill on metformin 500 mg once a day call into [facility] [place]. Pt is out

## 2012-10-28 NOTE — Telephone Encounter (Signed)
Dr Kirtland Bouchard wrote rx for pt

## 2012-11-11 ENCOUNTER — Ambulatory Visit (INDEPENDENT_AMBULATORY_CARE_PROVIDER_SITE_OTHER): Payer: BC Managed Care – PPO | Admitting: Internal Medicine

## 2012-11-11 ENCOUNTER — Encounter: Payer: Self-pay | Admitting: Internal Medicine

## 2012-11-11 VITALS — BP 130/70 | HR 60 | Temp 98.4°F | Resp 18 | Wt 196.0 lb

## 2012-11-11 DIAGNOSIS — I10 Essential (primary) hypertension: Secondary | ICD-10-CM

## 2012-11-11 DIAGNOSIS — E119 Type 2 diabetes mellitus without complications: Secondary | ICD-10-CM

## 2012-11-11 LAB — HEMOGLOBIN A1C: Hgb A1c MFr Bld: 5.6 % (ref 4.6–6.5)

## 2012-11-11 NOTE — Patient Instructions (Signed)
Limit your sodium (Salt) intake    It is important that you exercise regularly, at least 20 minutes 3 to 4 times per week.  If you develop chest pain or shortness of breath seek  medical attention.  Please check your blood pressure on a regular basis.  If it is consistently greater than 150/90, please make an office appointment.  

## 2012-11-11 NOTE — Progress Notes (Signed)
Subjective:    Patient ID: Javier [name], male    DOB: 03-May-1951, 62 y.o.   MRN: [id number]  HPI  62 year old patient who is seen today for followup. He has multi-drug-resistant hypertension as well as type 2 diabetes. Diabetes is well controlled on low dose metformin and hemoglobin A1c is certainly less than 6. He feels well today. No concerns or complaints.  Past Medical History  Diagnosis Date  . BENIGN PROSTATIC HYPERTROPHY 02/20/2007  . ELEVATED PROSTATE SPECIFIC ANTIGEN 02/26/2009  . 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 FASCIITIS, PLANTAR 09/30/2007  . GERD 02/20/2007  . HYPERTENSION 02/20/2007  . HYPOKALEMIA 03/02/2009  . Obesity   . Diabetes mellitus   . Heart murmur   . Depression     History   Social History  . Marital Status: Married    Spouse Name: N/A    Number of Children: N/A  . Years of Education: N/A   Occupational History  . Not on file.   Social History Main Topics  . Smoking status: Never Smoker   . Smokeless tobacco: Never Used  . Alcohol Use: No  . Drug Use: No  . Sexually Active: Not on file   Other Topics Concern  . Not on file   Social History Narrative  . No narrative on file    Past Surgical History  Procedure Laterality Date  . Knee surgery      left    No family history on file.  No Known Allergies  Current Outpatient Prescriptions on File Prior to Visit  Medication Sig Dispense Refill  . amLODipine (NORVASC) 10 MG tablet Take 1 tablet (10 mg total) by mouth daily.  90 tablet  3  . cloNIDine (CATAPRES) 0.2 MG tablet Take 1 tablet (0.2 mg total) by mouth 2 (two) times daily.  180 tablet  6  . escitalopram (LEXAPRO) 10 MG tablet Take 1 tablet (10 mg total) by mouth daily. For depression  2 weeks supply sample  30 tablet  0  . metFORMIN (GLUCOPHAGE XR) 500 MG 24 hr tablet Take 1 tablet (500 mg total) by mouth daily with breakfast.  90 tablet  6  . Nebivolol HCl (BYSTOLIC) 20 MG TABS Take 1 tablet (20 mg total) by mouth daily.   30 tablet  11  . potassium chloride SA (KLOR-CON M20) 20 MEQ tablet Take 1 tablet (20 mEq total) by mouth daily. For potassium replacement  90 tablet  3  . sildenafil (VIAGRA) 100 MG tablet Take 50-100 mg by mouth daily as needed.      . tadalafil (CIALIS) 20 MG tablet Take 1 tablet (20 mg total) by mouth daily as needed. For Erectile Dysfunction  10 tablet  0  . triamcinolone cream (KENALOG) 0.1 % Apply topically 2 (two) times daily.  30 g  0  . TRIBENZOR 40-10-25 MG TABS take 1 tablet by mouth once daily  30 tablet  2   No current facility-administered medications on file prior to visit.    BP 130/70  Pulse 60  Temp(Src) 98.4 F (36.9 C) (Oral)  Resp 18  Wt 196 lb (88.905 kg)  BMI 29.81 kg/m2  SpO2 97%       Review of Systems  Constitutional: Negative for fever, chills, appetite change and fatigue.  HENT: Negative for hearing loss, ear pain, congestion, sore throat, trouble swallowing, neck stiffness, dental problem, voice change and tinnitus.   Eyes: Negative for pain, discharge and visual disturbance.  Respiratory: Negative for cough, chest tightness,  wheezing and stridor.   Cardiovascular: Negative for chest pain, palpitations and leg swelling.  Gastrointestinal: Negative for nausea, vomiting, abdominal pain, diarrhea, constipation, blood in stool and abdominal distention.  Genitourinary: Negative for urgency, hematuria, flank pain, discharge, difficulty urinating and genital sores.  Musculoskeletal: Negative for myalgias, back pain, joint swelling, arthralgias and gait problem.  Skin: Negative for rash.  Neurological: Negative for dizziness, syncope, speech difficulty, weakness, numbness and headaches.  Hematological: Negative for adenopathy. Does not bruise/bleed easily.  Psychiatric/Behavioral: Negative for behavioral problems and dysphoric mood. The patient is not nervous/anxious.        Objective:   Physical Exam  Constitutional: He is oriented to person, place,  and time. He appears well-developed.  Blood pressure 130/70  HENT:  Head: Normocephalic.  Right Ear: External ear normal.  Left Ear: External ear normal.  Eyes: Conjunctivae and EOM are normal.  Neck: Normal range of motion.  Cardiovascular: Normal rate and normal heart sounds.   Pulmonary/Chest: Breath sounds normal.  Abdominal: Bowel sounds are normal.  Musculoskeletal: Normal range of motion. He exhibits no edema and no tenderness.  Neurological: He is alert and oriented to person, place, and time.  Psychiatric: He has a normal mood and affect. His behavior is normal.          Assessment & Plan:   Diabetes mellitus. Well controlled. We'll check a hemoglobin A1c.   Hypertension stable. We'll continue present regimen. Recheck 3 months

## 2012-11-14 ENCOUNTER — Other Ambulatory Visit: Payer: Self-pay | Admitting: Internal Medicine

## 2012-12-14 ENCOUNTER — Other Ambulatory Visit: Payer: Self-pay | Admitting: Internal Medicine

## 2012-12-20 ENCOUNTER — Telehealth: Payer: Self-pay | Admitting: Internal Medicine

## 2012-12-20 ENCOUNTER — Encounter: Payer: Self-pay | Admitting: Internal Medicine

## 2012-12-20 ENCOUNTER — Ambulatory Visit (INDEPENDENT_AMBULATORY_CARE_PROVIDER_SITE_OTHER): Payer: BC Managed Care – PPO | Admitting: Internal Medicine

## 2012-12-20 VITALS — BP 142/70 | HR 64 | Temp 100.1°F | Resp 18 | Wt 198.0 lb

## 2012-12-20 DIAGNOSIS — I10 Essential (primary) hypertension: Secondary | ICD-10-CM

## 2012-12-20 DIAGNOSIS — K219 Gastro-esophageal reflux disease without esophagitis: Secondary | ICD-10-CM

## 2012-12-20 DIAGNOSIS — E119 Type 2 diabetes mellitus without complications: Secondary | ICD-10-CM

## 2012-12-20 MED ORDER — PROMETHAZINE HCL 25 MG PO TABS: 25.0000 mg | ORAL_TABLET | Freq: Three times a day (TID) | ORAL | Status: DC | PRN

## 2012-12-20 MED ORDER — POTASSIUM CHLORIDE CRYS ER 20 MEQ PO TBCR: 20.0000 meq | EXTENDED_RELEASE_TABLET | Freq: Every day | ORAL | Status: DC

## 2012-12-20 NOTE — Telephone Encounter (Signed)
Patient Information:  Caller Name: [place]  Phone: [phone]  Patient: Javier [name], Javier [name]  Gender: Male  DOB: [birth year]  Age: 62 Years  PCP: Eleonore Chiquito [facility])  Office Follow Up:  Does the office need to follow up with this patient?: No  Instructions For The Office: N/A  RN Note:  Temp/tactile; states feels very warm.  States has had nausea but no vomiting.  States he may have food poisoning from spoiled food in freezer/power off for several days.  Per diarrhea protocol, emergent symptoms denied; advised appt today.  Appt scheduled 1600 12/20/12 with Dr. Amador Cunas.  krs/can  Symptoms  Reason For Call & Symptoms: nausea, diarrhea  Reviewed Health History In EMR: Yes  Reviewed Medications In EMR: Yes  Reviewed Allergies In EMR: Yes  Reviewed Surgeries / Procedures: Yes  Date of Onset of Symptoms: 12/19/2012  Guideline(s) Used:  Diarrhea  Disposition Per Guideline:   See Today in Office  Reason For Disposition Reached:   Fever > 101 F (38.3 C)  Advice Given:  N/A  Patient Will Follow Care Advice:  YES  Appointment Scheduled:  12/20/2012 16:00:00 Appointment Scheduled Provider:  Eleonore Chiquito [facility])

## 2012-12-20 NOTE — Patient Instructions (Signed)
Drink as much fluid as you  can tolerate over the next few days and advance diet as tolerated   Please check your hemoglobin A1c every 3 months  Limit your sodium (Salt) intake

## 2012-12-20 NOTE — Progress Notes (Signed)
Subjective:    Patient ID: Javier [name], male    DOB: Sep 02, 1951, 62 y.o.   MRN: [id number]  HPI  62 year old patient who has diabetes as well as multi-drug-resistant hypertension. For the past 24 hours he has had some nausea diarrhea low-grade fever anorexia. No vomiting. He has worked yesterday and today. Appetite has been poor with poor oral intake. Diabetic medical management consist of metformin only.  Past Medical History  Diagnosis Date  . BENIGN PROSTATIC HYPERTROPHY 02/20/2007  . ELEVATED PROSTATE SPECIFIC ANTIGEN 02/26/2009  . 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 FASCIITIS, PLANTAR 09/30/2007  . GERD 02/20/2007  . HYPERTENSION 02/20/2007  . HYPOKALEMIA 03/02/2009  . Obesity   . Diabetes mellitus   . Heart murmur   . Depression     History   Social History  . Marital Status: Married    Spouse Name: N/A    Number of Children: N/A  . Years of Education: N/A   Occupational History  . Not on file.   Social History Main Topics  . Smoking status: Never Smoker   . Smokeless tobacco: Never Used  . Alcohol Use: No  . Drug Use: No  . Sexually Active: Not on file   Other Topics Concern  . Not on file   Social History Narrative  . No narrative on file    Past Surgical History  Procedure Laterality Date  . Knee surgery      left    No family history on file.  No Known Allergies  Current Outpatient Prescriptions on File Prior to Visit  Medication Sig Dispense Refill  . amLODipine (NORVASC) 10 MG tablet Take 1 tablet (10 mg total) by mouth daily.  90 tablet  3  . CIALIS 20 MG tablet take 1 tablet by mouth once daily if needed for ERECTILE DYSFUNCTION  10 tablet  0  . cloNIDine (CATAPRES) 0.2 MG tablet Take 1 tablet (0.2 mg total) by mouth 2 (two) times daily.  180 tablet  6  . escitalopram (LEXAPRO) 10 MG tablet Take 1 tablet (10 mg total) by mouth daily. For depression  2 weeks supply sample  30 tablet  0  . metFORMIN (GLUCOPHAGE XR) 500 MG 24 hr tablet Take  1 tablet (500 mg total) by mouth daily with breakfast.  90 tablet  6  . Nebivolol HCl (BYSTOLIC) 20 MG TABS Take 1 tablet (20 mg total) by mouth daily.  30 tablet  11  . sildenafil (VIAGRA) 100 MG tablet Take 50-100 mg by mouth daily as needed.      . triamcinolone cream (KENALOG) 0.1 % Apply topically 2 (two) times daily.  30 g  0  . TRIBENZOR 40-10-25 MG TABS take 1 tablet by mouth once daily  30 tablet  3   No current facility-administered medications on file prior to visit.    BP 142/70  Pulse 64  Temp(Src) 100.1 F (37.8 C) (Oral)  Resp 18  Wt 198 lb (89.812 kg)  BMI 30.11 kg/m2  SpO2 98%       Review of Systems  Constitutional: Positive for fever and fatigue. Negative for chills and appetite change.  HENT: Negative for hearing loss, ear pain, congestion, sore throat, trouble swallowing, neck stiffness, dental problem, voice change and tinnitus.   Eyes: Negative for pain, discharge and visual disturbance.  Respiratory: Negative for cough, chest tightness, wheezing and stridor.   Cardiovascular: Negative for chest pain, palpitations and leg swelling.  Gastrointestinal: Positive for nausea and diarrhea. Negative  for vomiting, abdominal pain, constipation, blood in stool and abdominal distention.  Genitourinary: Negative for urgency, hematuria, flank pain, discharge, difficulty urinating and genital sores.  Musculoskeletal: Negative for myalgias, back pain, joint swelling, arthralgias and gait problem.  Skin: Negative for rash.  Neurological: Positive for weakness. Negative for dizziness, syncope, speech difficulty, numbness and headaches.  Hematological: Negative for adenopathy. Does not bruise/bleed easily.  Psychiatric/Behavioral: Negative for behavioral problems and dysphoric mood. The patient is not nervous/anxious.        Objective:   Physical Exam  Constitutional: He is oriented to person, place, and time. He appears well-developed.  Blood pressure 140/70  HENT:   Head: Normocephalic.  Right Ear: External ear normal.  Left Ear: External ear normal.  Eyes: Conjunctivae and EOM are normal.  Neck: Normal range of motion.  Cardiovascular: Normal rate and normal heart sounds.   Pulmonary/Chest: Effort normal and breath sounds normal.  Abdominal: Bowel sounds are normal.  mild epigastric tenderness  Musculoskeletal: Normal range of motion. He exhibits no edema and no tenderness.  Neurological: He is alert and oriented to person, place, and time.  Psychiatric: He has a normal mood and affect. His behavior is normal.          Assessment & Plan:   Viral syndrome with nausea diarrhea. We'll treat symptomatically with Phenergan. Leave of absence from work until Tuesday, April 8 Diabetes Hypertension-  patient is no longer on Tegretol therapy and blood pressures have been trending much lower. We'll discontinue additional amlodipine dose

## 2013-02-03 ENCOUNTER — Ambulatory Visit (INDEPENDENT_AMBULATORY_CARE_PROVIDER_SITE_OTHER): Payer: BC Managed Care – PPO | Admitting: Internal Medicine

## 2013-02-03 ENCOUNTER — Encounter: Payer: Self-pay | Admitting: Internal Medicine

## 2013-02-03 VITALS — BP 146/74 | HR 60 | Temp 99.0°F | Resp 20 | Wt 200.0 lb

## 2013-02-03 DIAGNOSIS — E119 Type 2 diabetes mellitus without complications: Secondary | ICD-10-CM

## 2013-02-03 DIAGNOSIS — I10 Essential (primary) hypertension: Secondary | ICD-10-CM

## 2013-02-03 NOTE — Patient Instructions (Signed)
You need to lose weight.  Consider a lower calorie diet and regular exercise.  Limit your sodium (Salt) intake    It is important that you exercise regularly, at least 20 minutes 3 to 4 times per week.  If you develop chest pain or shortness of breath seek  medical attention.  Please check your blood pressure on a regular basis.  If it is consistently greater than 150/90, please make an office appointment. Return in 3 months for follow-up

## 2013-02-03 NOTE — Progress Notes (Signed)
Subjective:    Patient ID: Javier [name], male    DOB: 1950-10-25, 62 y.o.   MRN: [id number]  HPI  62 year old patient who is seen today in followup. He has a history of multi-drug-resistant hypertension aggravated by Tegretol treatment in the past.  He still requires multiple drugs but since discontinuation of Tegretol but pressure has been well-controlled. When seen 2 months ago due 2 peripheral edema and well-controlled pressure, and attempt was made to decrease amlodipine from 20 mg daily to 10 mg daily. He noted increase in blood pressure readings. His peripheral edema is modest and controlled with elevation. Unfortunately is been a 15 pound weight gain since his last visit  Past Medical History  Diagnosis Date  . BENIGN PROSTATIC HYPERTROPHY 02/20/2007  . ELEVATED PROSTATE SPECIFIC ANTIGEN 02/26/2009  . 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 FASCIITIS, PLANTAR 09/30/2007  . GERD 02/20/2007  . HYPERTENSION 02/20/2007  . HYPOKALEMIA 03/02/2009  . Obesity   . Diabetes mellitus   . Heart murmur   . Depression     History   Social History  . Marital Status: Married    Spouse Name: N/A    Number of Children: N/A  . Years of Education: N/A   Occupational History  . Not on file.   Social History Main Topics  . Smoking status: Never Smoker   . Smokeless tobacco: Never Used  . Alcohol Use: No  . Drug Use: No  . Sexually Active: Not on file   Other Topics Concern  . Not on file   Social History Narrative  . No narrative on file    Past Surgical History  Procedure Laterality Date  . Knee surgery      left    No family history on file.  No Known Allergies  Current Outpatient Prescriptions on File Prior to Visit  Medication Sig Dispense Refill  . CIALIS 20 MG tablet take 1 tablet by mouth once daily if needed for ERECTILE DYSFUNCTION  10 tablet  0  . cloNIDine (CATAPRES) 0.2 MG tablet Take 1 tablet (0.2 mg total) by mouth 2 (two) times daily.  180 tablet  6  .  escitalopram (LEXAPRO) 10 MG tablet Take 1 tablet (10 mg total) by mouth daily. For depression  2 weeks supply sample  30 tablet  0  . metFORMIN (GLUCOPHAGE XR) 500 MG 24 hr tablet Take 1 tablet (500 mg total) by mouth daily with breakfast.  90 tablet  6  . Nebivolol HCl (BYSTOLIC) 20 MG TABS Take 1 tablet (20 mg total) by mouth daily.  30 tablet  11  . potassium chloride SA (KLOR-CON M20) 20 MEQ tablet Take 1 tablet (20 mEq total) by mouth daily. For potassium replacement  90 tablet  3  . promethazine (PHENERGAN) 25 MG tablet Take 1 tablet (25 mg total) by mouth every 8 (eight) hours as needed for nausea.  20 tablet  0  . TRIBENZOR 40-10-25 MG TABS take 1 tablet by mouth once daily  30 tablet  3   No current facility-administered medications on file prior to visit.    BP 146/74  Pulse 60  Temp(Src) 99 F (37.2 C) (Oral)  Resp 20  Wt 200 lb (90.719 kg)  BMI 30.42 kg/m2  SpO2 98%       Review of Systems  Constitutional: Negative for fever, chills, appetite change and fatigue.  HENT: Negative for hearing loss, ear pain, congestion, sore throat, trouble swallowing, neck stiffness, dental problem, voice change and  tinnitus.   Eyes: Negative for pain, discharge and visual disturbance.  Respiratory: Negative for cough, chest tightness, wheezing and stridor.   Cardiovascular: Negative for chest pain, palpitations and leg swelling.  Gastrointestinal: Negative for nausea, vomiting, abdominal pain, diarrhea, constipation, blood in stool and abdominal distention.  Genitourinary: Negative for urgency, hematuria, flank pain, discharge, difficulty urinating and genital sores.  Musculoskeletal: Negative for myalgias, back pain, joint swelling, arthralgias and gait problem.  Skin: Negative for rash.  Neurological: Negative for dizziness, syncope, speech difficulty, weakness, numbness and headaches.  Hematological: Negative for adenopathy. Does not bruise/bleed easily.  Psychiatric/Behavioral:  Negative for behavioral problems and dysphoric mood. The patient is not nervous/anxious.        Objective:   Physical Exam  Constitutional: He is oriented to person, place, and time. He appears well-developed.  Blood pressure 140/60  HENT:  Head: Normocephalic.  Right Ear: External ear normal.  Left Ear: External ear normal.  Eyes: Conjunctivae and EOM are normal.  Neck: Normal range of motion.  Cardiovascular: Normal rate and normal heart sounds.   Pulmonary/Chest: Breath sounds normal.  Abdominal: Bowel sounds are normal.  Musculoskeletal: Normal range of motion. He exhibits no edema and no tenderness.  Neurological: He is alert and oriented to person, place, and time.  Psychiatric: He has a normal mood and affect. His behavior is normal.          Assessment & Plan:   Hypertension well controlled. Weight loss encouraged low-salt diuretic amended. Patient will continue home blood pressure monitoring. Recheck 3 months Diabetes mellitus. It has been less than 3 months since his last hemoglobin A1c which is always less than 6. Will defer for 3 months he

## 2013-02-08 IMAGING — CR DG CHEST 2V
2 series · 2 of 2 positions shown · non-contrast
Comparison: 05/08/2011

CLINICAL DATA: Dizziness, history hypertension, diabetes

CHEST - 2 VIEW

[w chest pa]
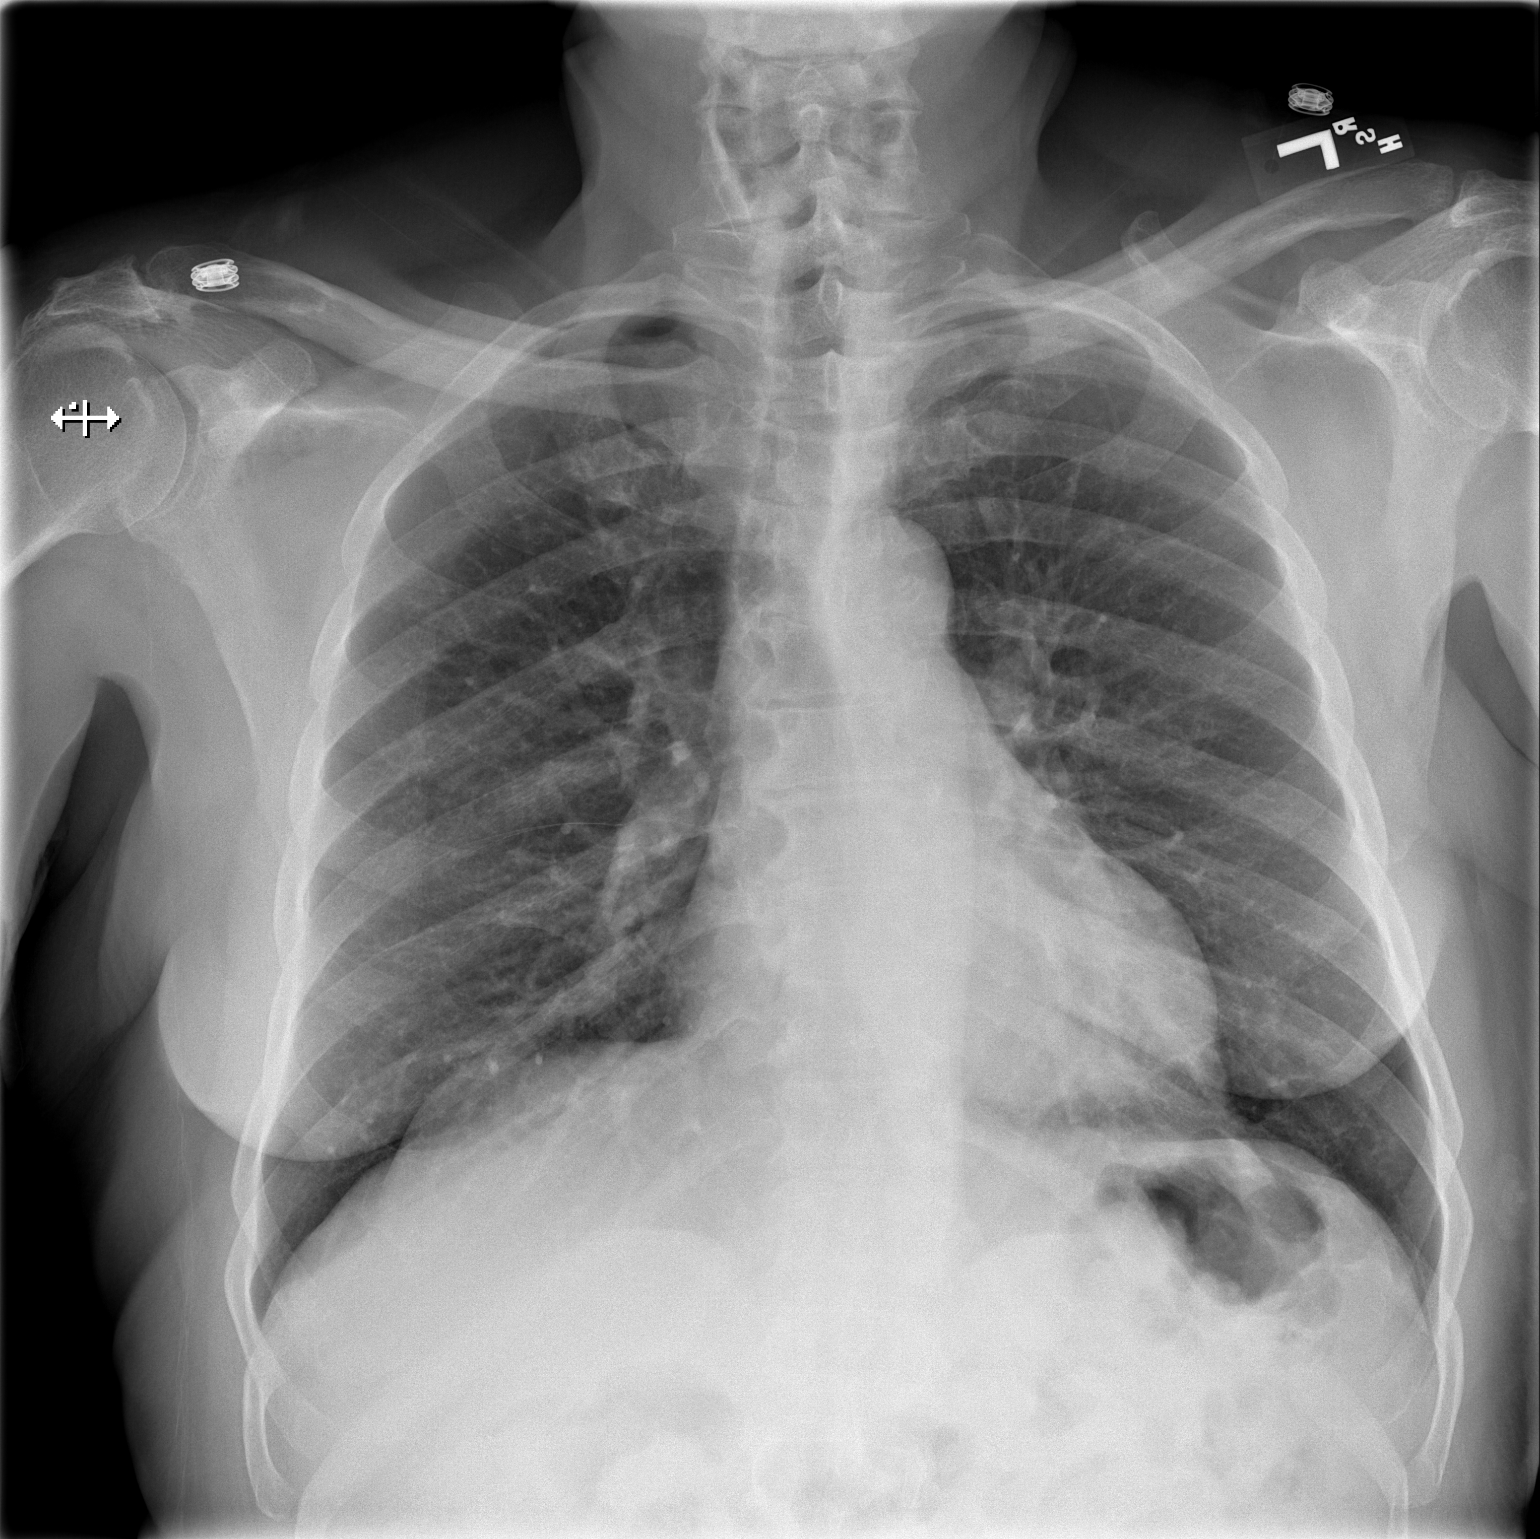

[w chest lat]
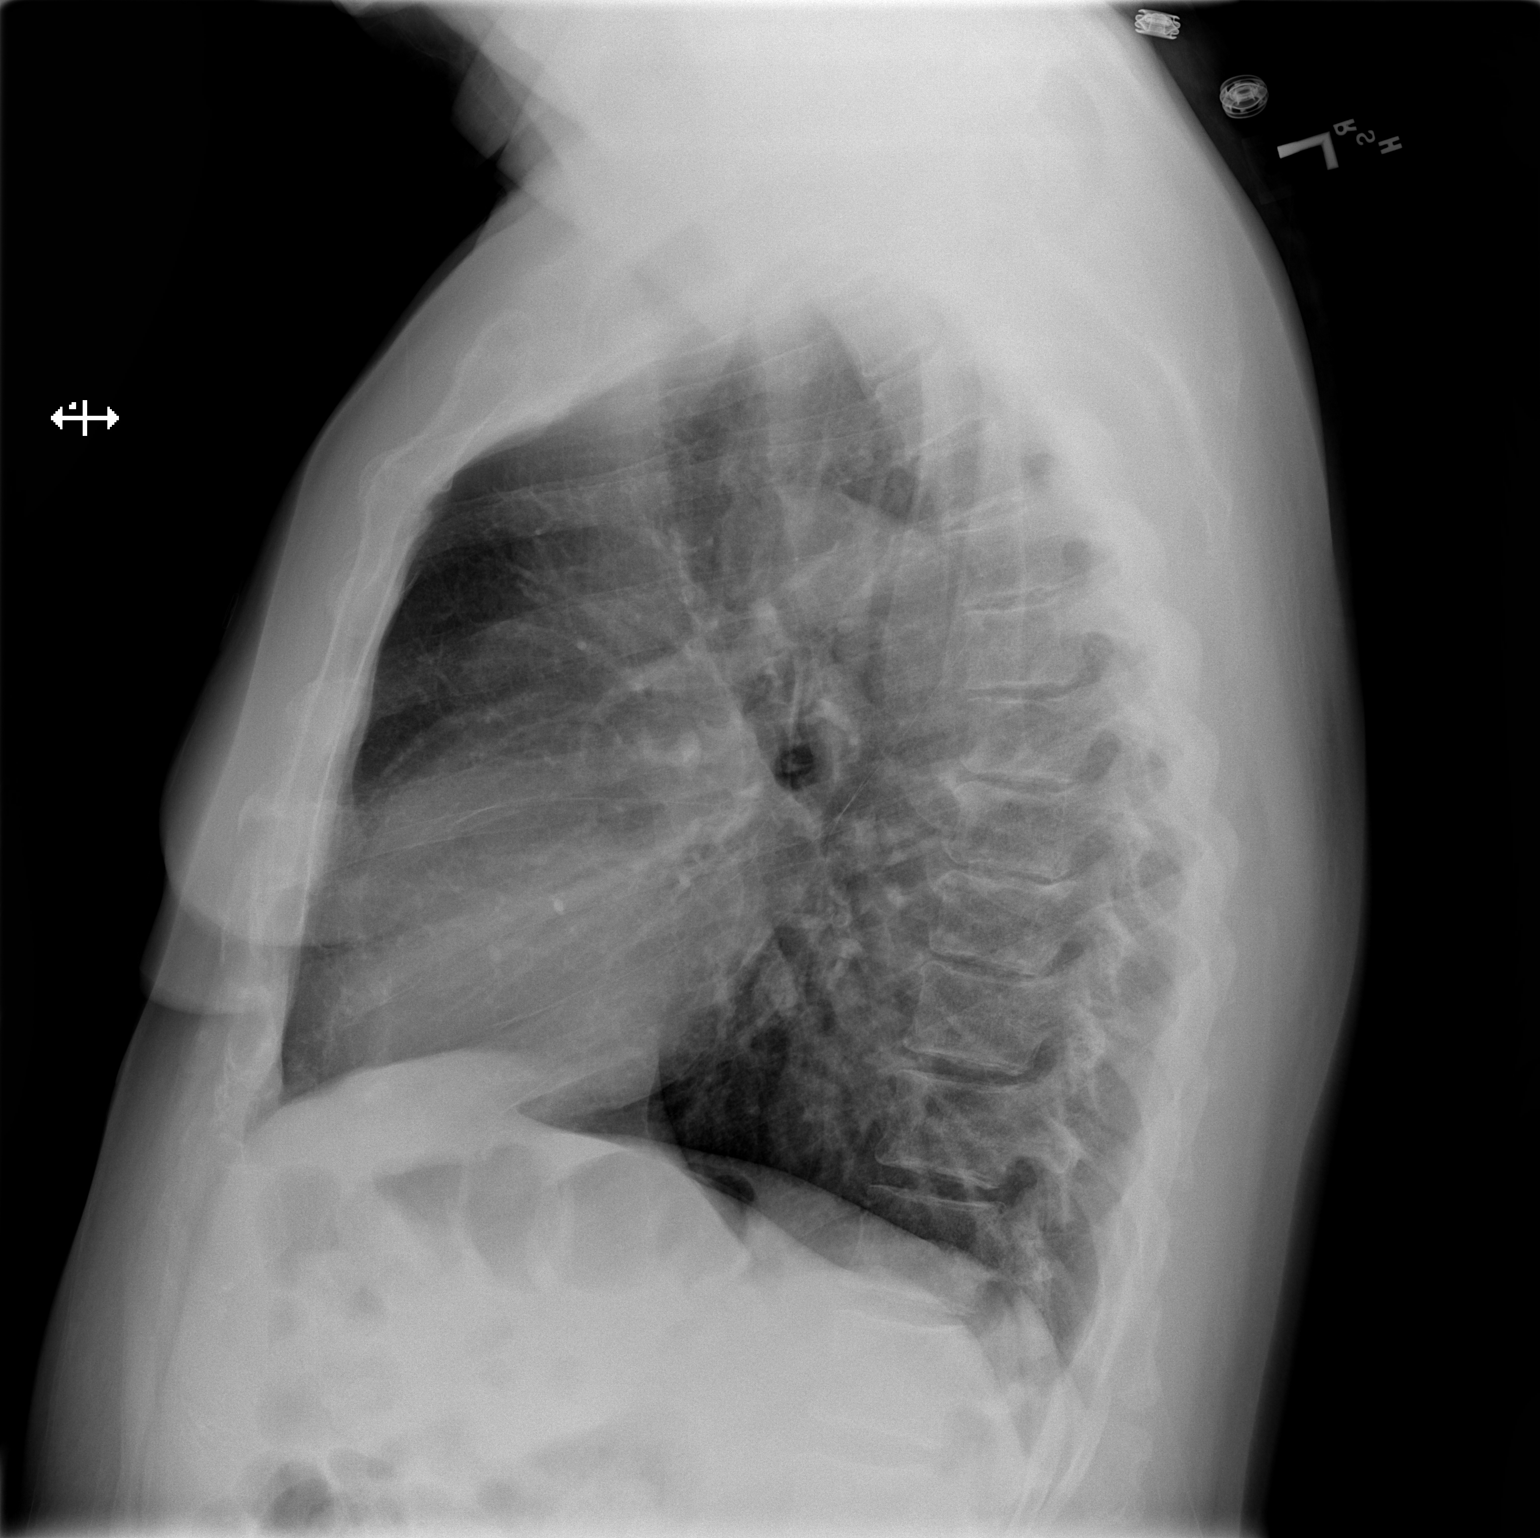

[2 of 2 positions shown; findings below may reference images not displayed]

FINDINGS: Upper-normal size of cardiac silhouette.
Mediastinal contours and pulmonary vascularity normal.
Lungs clear.
No pleural effusion or pneumothorax.
Multilevel endplate spur formation thoracic spine.
Bilateral inferior spurring at the AC joints, which may predispose
the patient to rotator cuff pathology.
IMPRESSION: No acute abnormalities.

## 2013-02-08 IMAGING — CT CT HEAD W/O CM
1 series · 16 of 30 positions shown, 20 images · non-contrast
Comparison: 05/08/2011.

CLINICAL DATA: Dizziness.  Right arm heaviness.

CT HEAD WITHOUT CONTRAST
TECHNIQUE: Contiguous axial images were obtained from the base of
the skull through the vertex without contrast.

[Series 2: head routine 4.8 h37s · axial · 0.45mm/px · z∈[-113,+47]mm · 16 of 36 slices shown, 20 images]
[im 2/36  brain]
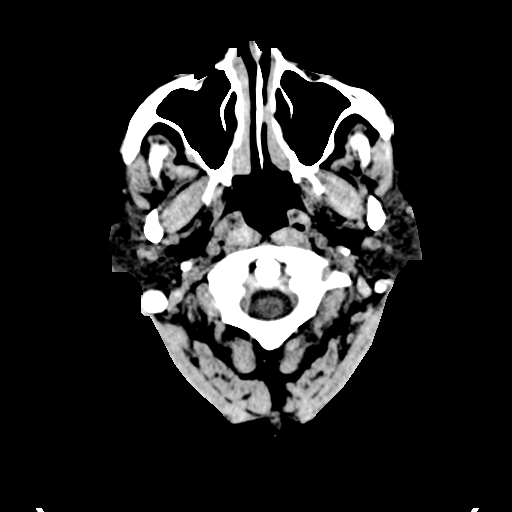
[im 2/36  bone]
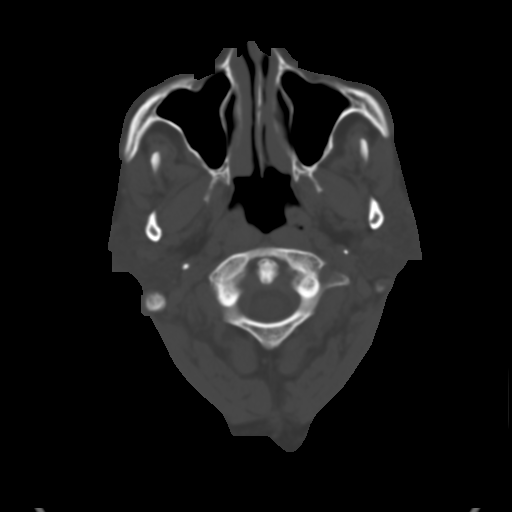
[im 4/36  brain]
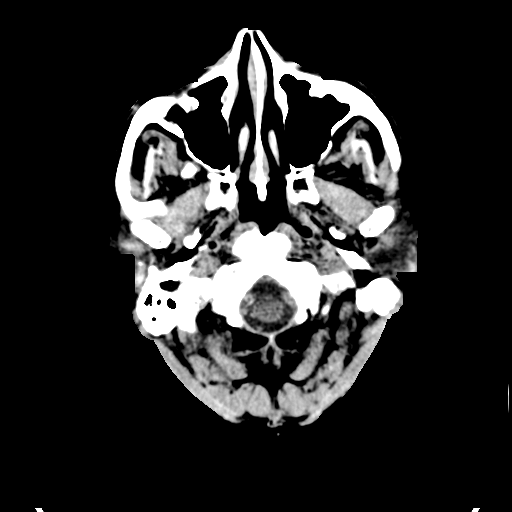
[im 7/36  brain]
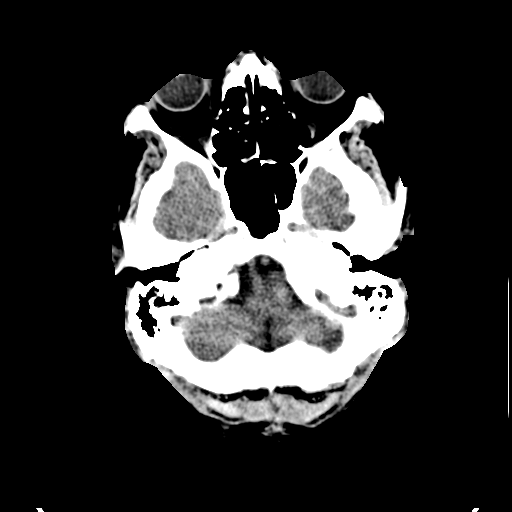
[im 9/36  brain]
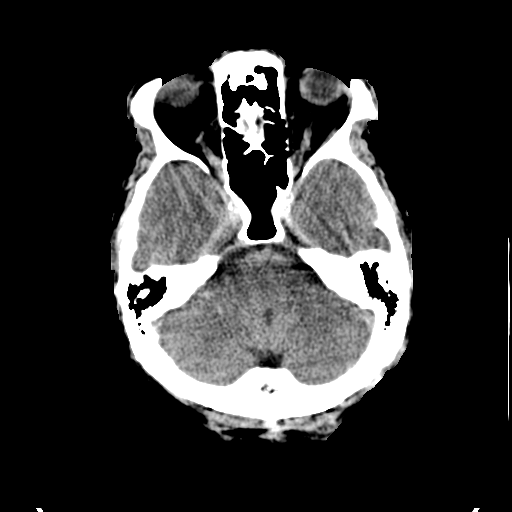
[im 10/36  brain]
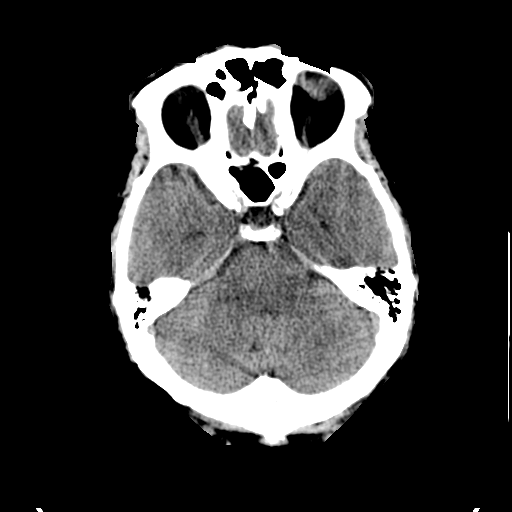
[im 10/36  bone]
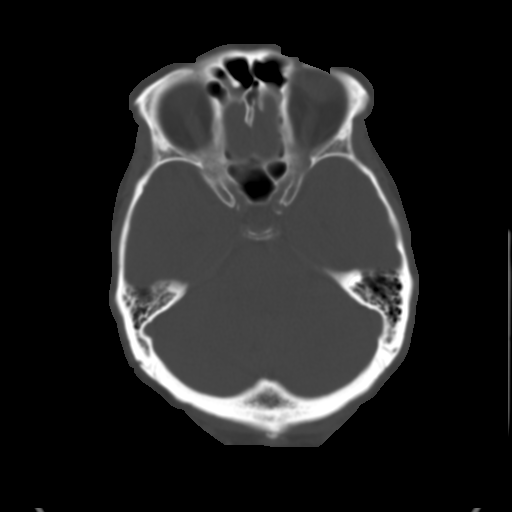
[im 13/36  brain]
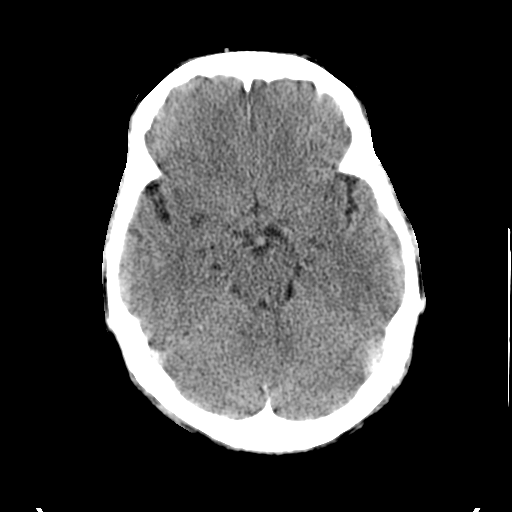
[im 15/36  brain]
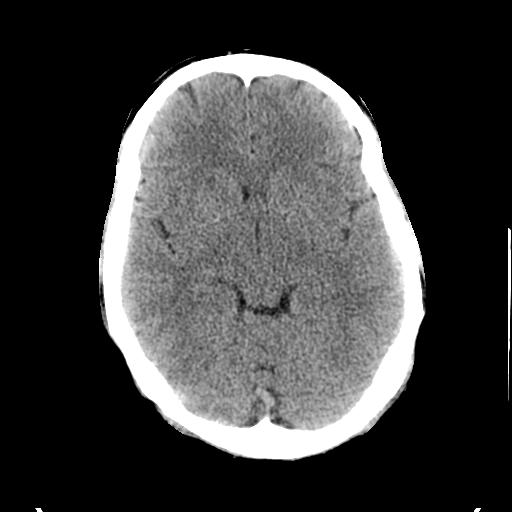
[im 17/36  brain]
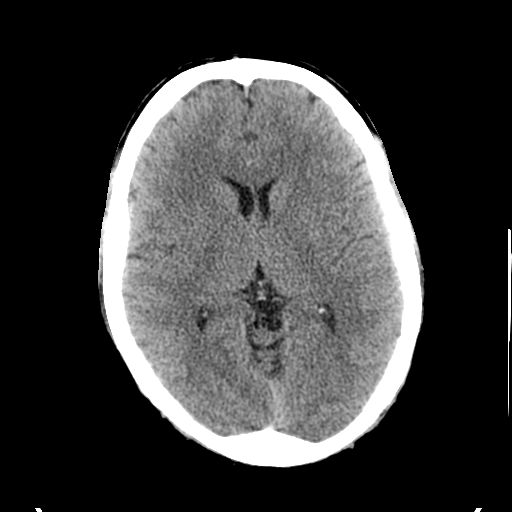
[im 19/36  brain]
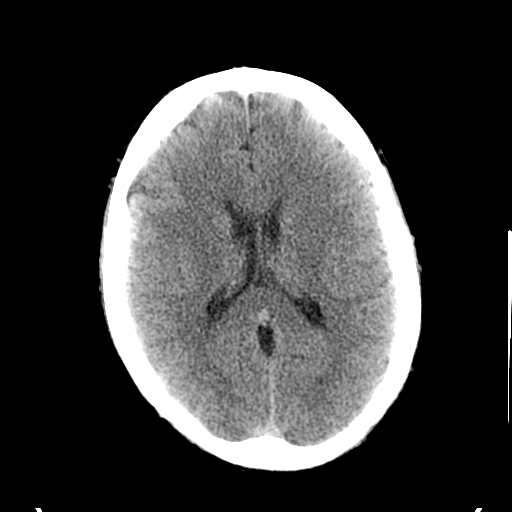
[im 19/36  bone]
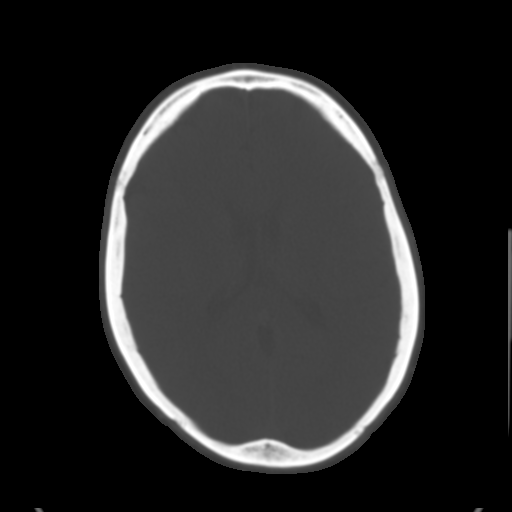
[im 21/36  brain]
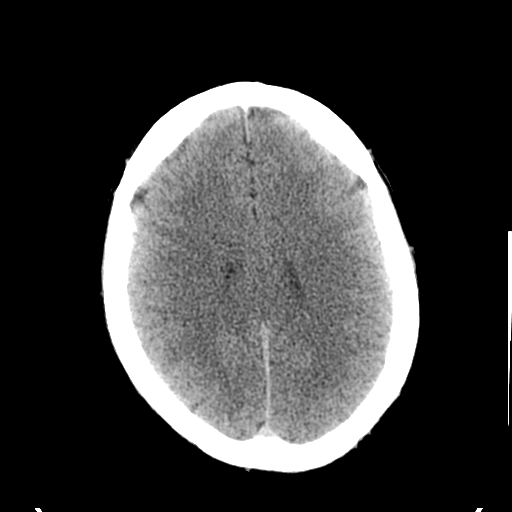
[im 23/36  brain]
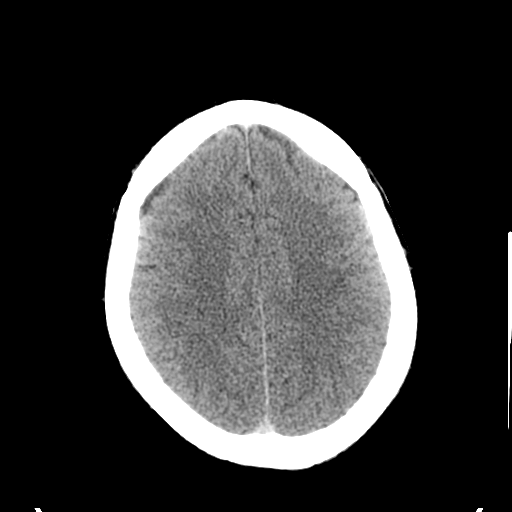
[im 26/36  brain]
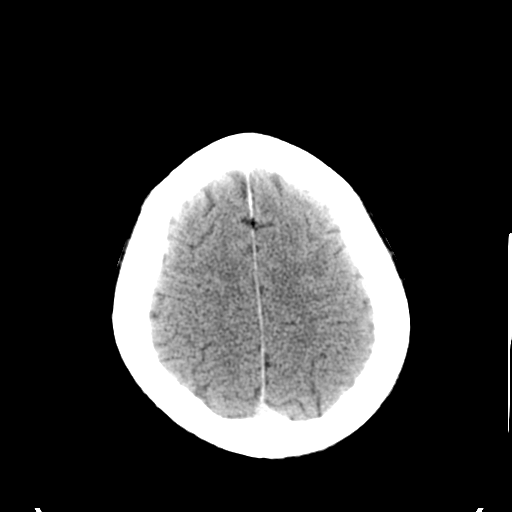
[im 27/36  brain]
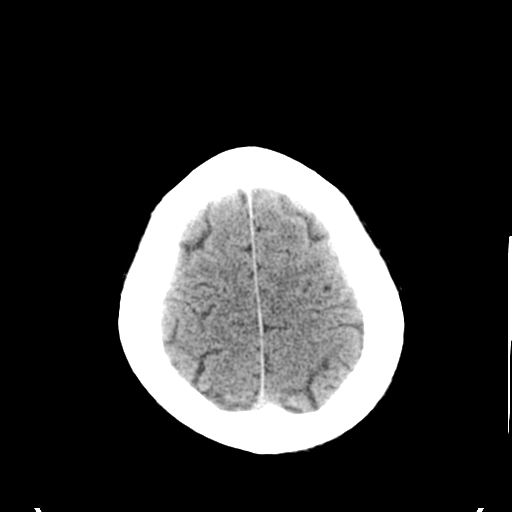
[im 27/36  bone]
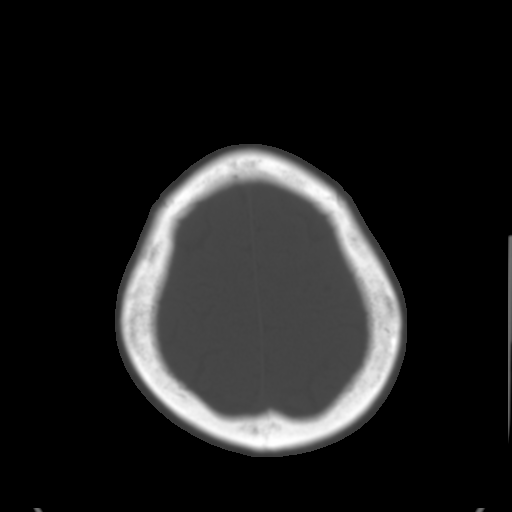
[im 29/36  brain]
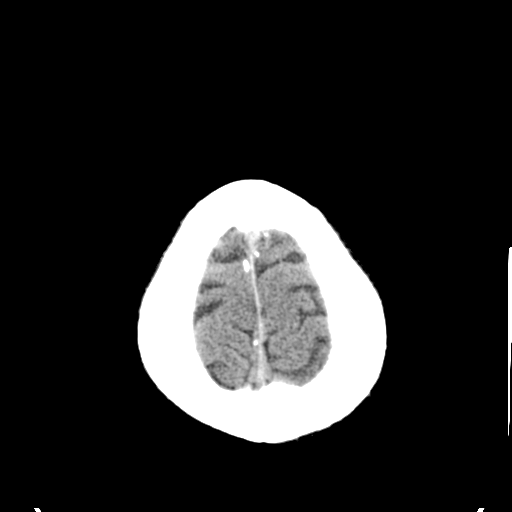
[im 32/36  brain]
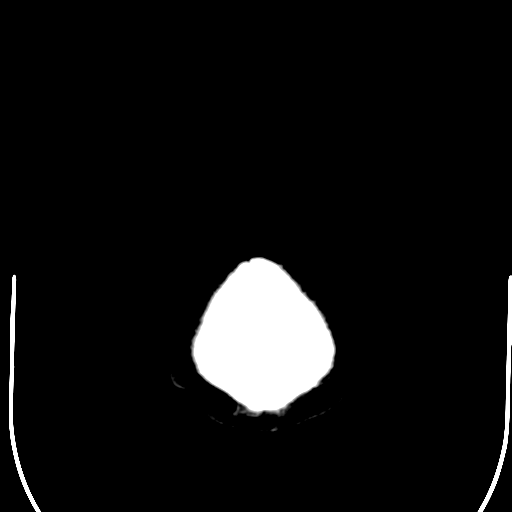
[im 34/36  brain]
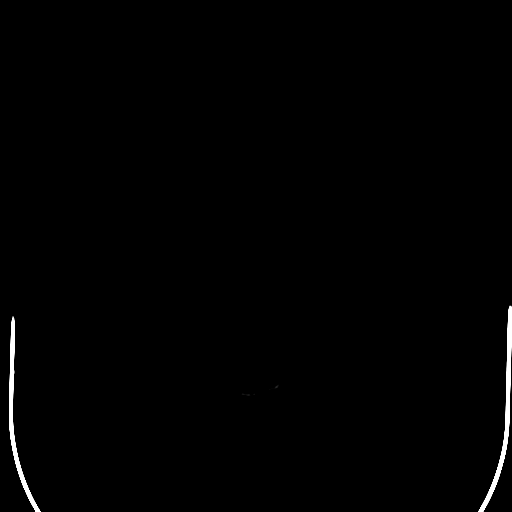

[16 of 30 positions shown; findings below may reference images not displayed]

FINDINGS: No intracranial hemorrhage..

Prominent perivascular space versus remote lacunar infarct right
basal ganglia unchanged.

No CT evidence of large acute infarct.  Small acute infarct cannot
be excluded by CT..

No intracranial mass lesion detected on this unenhanced exam..

No hydrocephalus.

Mild ectasia of the basilar artery unchanged.  Calcified cavernous
segment internal carotid arteries.  Partially empty sella
unchanged.  Orbital structures unremarkable.
IMPRESSION: No intracranial hemorrhage or CT evidence of large acute infarct.

## 2013-02-18 ENCOUNTER — Other Ambulatory Visit: Payer: Self-pay | Admitting: Internal Medicine

## 2013-04-07 ENCOUNTER — Other Ambulatory Visit: Payer: Self-pay | Admitting: Internal Medicine

## 2013-05-05 ENCOUNTER — Ambulatory Visit (INDEPENDENT_AMBULATORY_CARE_PROVIDER_SITE_OTHER): Payer: BC Managed Care – PPO | Admitting: Internal Medicine

## 2013-05-05 ENCOUNTER — Encounter: Payer: Self-pay | Admitting: Internal Medicine

## 2013-05-05 VITALS — BP 140/76 | HR 59 | Temp 98.1°F | Resp 20 | Wt 211.0 lb

## 2013-05-05 DIAGNOSIS — I10 Essential (primary) hypertension: Secondary | ICD-10-CM

## 2013-05-05 DIAGNOSIS — E119 Type 2 diabetes mellitus without complications: Secondary | ICD-10-CM

## 2013-05-05 LAB — HEMOGLOBIN A1C: Hgb A1c MFr Bld: 5.7 % (ref 4.6–6.5)

## 2013-05-05 NOTE — Patient Instructions (Signed)
Limit your sodium (Salt) intake    It is important that you exercise regularly, at least 20 minutes 3 to 4 times per week.  If you develop chest pain or shortness of breath seek  medical attention.  Please check your blood pressure on a regular basis.  If it is consistently greater than 150/90, please make an office appointment.  

## 2013-05-05 NOTE — Progress Notes (Signed)
Subjective:    Patient ID: Javier [name], male    DOB: 04-18-1951, 62 y.o.   MRN: [id number]  HPI  62 year old patient who has multi-drug-resistant hypertension. He is seen today for his three-month followup. He also has a history of type 2 diabetes which has been well-controlled on metformin therapy. In the A1c is has generally been in a nondiabetic range. No concerns or complaints.  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 He continues to monitor home blood pressure readings with nice results. He has been compliant with his medication  Past Medical History  Diagnosis Date  . BENIGN PROSTATIC HYPERTROPHY 02/20/2007  . ELEVATED PROSTATE SPECIFIC ANTIGEN 02/26/2009  . 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 FASCIITIS, PLANTAR 09/30/2007  . GERD 02/20/2007  . HYPERTENSION 02/20/2007  . HYPOKALEMIA 03/02/2009  . Obesity   . Diabetes mellitus   . Heart murmur   . Depression     History   Social History  . Marital Status: Married    Spouse Name: N/A    Number of Children: N/A  . Years of Education: N/A   Occupational History  . Not on file.   Social History Main Topics  . Smoking status: Never Smoker   . Smokeless tobacco: Never Used  . Alcohol Use: No  . Drug Use: No  . Sexual Activity: Not on file   Other Topics Concern  . Not on file   Social History Narrative  . No narrative on file    Past Surgical History  Procedure Laterality Date  . Knee surgery      left    No family history on file.  No Known Allergies  Current Outpatient Prescriptions on File Prior to Visit  Medication Sig Dispense Refill  . amLODipine (NORVASC) 10 MG tablet Take 10 mg by mouth daily.       Marland Kitchen BYSTOLIC 20 MG TABS take 1 tablet by mouth once daily  30 tablet  2  . CIALIS 20 MG tablet take 1 tablet by mouth once daily if needed for ERECTILE DYSFUNCTION  10 tablet  0  . metFORMIN (GLUCOPHAGE XR) 500 MG 24 hr tablet Take 1 tablet (500 mg total) by mouth  daily with breakfast.  90 tablet  6  . potassium chloride SA (KLOR-CON M20) 20 MEQ tablet Take 1 tablet (20 mEq total) by mouth daily. For potassium replacement  90 tablet  3  . TRIBENZOR 40-10-25 MG TABS take 1 tablet by mouth once daily  30 tablet  3   No current facility-administered medications on file prior to visit.    BP 140/76  Pulse 59  Temp(Src) 98.1 F (36.7 C) (Oral)  Resp 20  Wt 211 lb (95.709 kg)  BMI 32.09 kg/m2  SpO2 98%       Review of Systems  Constitutional: Negative for fever, chills, appetite change and fatigue.  HENT: Negative for hearing loss, ear pain, congestion, sore throat, trouble swallowing, neck stiffness, dental problem, voice change and tinnitus.   Eyes: Negative for pain, discharge and visual disturbance.  Respiratory: Negative for cough, chest tightness, wheezing and stridor.   Cardiovascular: Negative for chest pain, palpitations and leg swelling.  Gastrointestinal: Negative for nausea, vomiting, abdominal pain, diarrhea, constipation, blood in stool and abdominal distention.  Genitourinary: Negative for urgency, hematuria, flank pain, discharge, difficulty urinating and genital sores.  Musculoskeletal: Negative for myalgias, back pain, joint swelling, arthralgias and gait problem.  Skin: Negative for rash.  Neurological:  Negative for dizziness, syncope, speech difficulty, weakness, numbness and headaches.  Hematological: Negative for adenopathy. Does not bruise/bleed easily.  Psychiatric/Behavioral: Negative for behavioral problems and dysphoric mood. The patient is not nervous/anxious.        Objective:   Physical Exam  Constitutional: He is oriented to person, place, and time. He appears well-developed.  HENT:  Head: Normocephalic.  Right Ear: External ear normal.  Left Ear: External ear normal.  Eyes: Conjunctivae and EOM are normal.  Neck: Normal range of motion.  Cardiovascular: Normal rate and normal heart sounds.    Pulmonary/Chest: Breath sounds normal.  Abdominal: Bowel sounds are normal.  Musculoskeletal: Normal range of motion. He exhibits no edema and no tenderness.  Neurological: He is alert and oriented to person, place, and time.  Psychiatric: He has a normal mood and affect. His behavior is normal.          Assessment & Plan:   Hypertension. Well controlled repeat blood pressure 140/70 Diabetes mellitus. Well controlled. We'll check a hemoglobin A1c  Recheck 3 months

## 2013-05-31 ENCOUNTER — Other Ambulatory Visit: Payer: Self-pay | Admitting: Internal Medicine

## 2013-06-22 ENCOUNTER — Other Ambulatory Visit: Payer: Self-pay | Admitting: Internal Medicine

## 2013-06-23 NOTE — Telephone Encounter (Signed)
Pt request new refill for amLODipine (NORVASC) 10 MG tablet 1/ day 30 day Pharm: [facility]/ [address]

## 2013-06-26 ENCOUNTER — Other Ambulatory Visit: Payer: Self-pay | Admitting: Internal Medicine

## 2013-07-31 ENCOUNTER — Telehealth: Payer: Self-pay | Admitting: Internal Medicine

## 2013-07-31 NOTE — Telephone Encounter (Signed)
Pt is waiting for bystolic 20 mg per pt med on manufacture  back order. Pt would like samples in meantime.

## 2013-07-31 NOTE — Telephone Encounter (Signed)
Spoke to pt told him I have samples of Bystolic but it is 10 mg will have to take 2 tablets but there is a month supply it will be at the front desk for you. Pt verbalized understanding.

## 2013-08-04 ENCOUNTER — Encounter: Payer: Self-pay | Admitting: Internal Medicine

## 2013-08-04 ENCOUNTER — Ambulatory Visit (INDEPENDENT_AMBULATORY_CARE_PROVIDER_SITE_OTHER): Payer: BC Managed Care – PPO | Admitting: Internal Medicine

## 2013-08-04 VITALS — BP 160/84 | HR 58 | Temp 98.2°F | Resp 20 | Wt 219.0 lb

## 2013-08-04 DIAGNOSIS — Z23 Encounter for immunization: Secondary | ICD-10-CM

## 2013-08-04 DIAGNOSIS — E119 Type 2 diabetes mellitus without complications: Secondary | ICD-10-CM

## 2013-08-04 DIAGNOSIS — I10 Essential (primary) hypertension: Secondary | ICD-10-CM

## 2013-08-04 NOTE — Progress Notes (Signed)
Pre-visit discussion using our clinic review tool. No additional management support is needed unless otherwise documented below in the visit note.  

## 2013-08-04 NOTE — Progress Notes (Signed)
Subjective:    Patient ID: Javier [name], male    DOB: [birth year], 62 y.o.   MRN: [id number]  HPI Pre-visit discussion using our clinic review tool. No additional management support is needed unless otherwise documented below in the visit note.  62 year old patient who is seen today for followup of multi-drug-resistant hypertension.  Medical regimen includes triple therapy with tribenzor as well as additional amlodipine.  Patient had been on Tegretol in the past which was felt to interfere with the effectiveness of his blood pressure regimen. He tolerates amlodipine 20 mg dose well without headaches or peripheral edema. He has self discontinued Catapres approximately 4 weeks ago due to vivid dreams and loss of libido. These symptoms have resolved since discontinuation of this medication  He has a history of diabetes mellitus which has been tightly controlled on metformin therapy alone. Hemoglobin A1c  are less than 6.  His only complaint today is some mild dizziness. Blood pressure readings at home are generally in the 140/70 range  Past Medical History  Diagnosis Date  . BENIGN PROSTATIC HYPERTROPHY 02/20/2007  . ELEVATED PROSTATE SPECIFIC ANTIGEN 02/26/2009  . 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 FASCIITIS, PLANTAR 09/30/2007  . GERD 02/20/2007  . HYPERTENSION 02/20/2007  . HYPOKALEMIA 03/02/2009  . Obesity   . Diabetes mellitus   . Heart murmur   . Depression     History   Social History  . Marital Status: Married    Spouse Name: N/A    Number of Children: N/A  . Years of Education: N/A   Occupational History  . Not on file.   Social History Main Topics  . Smoking status: Never Smoker   . Smokeless tobacco: Never Used  . Alcohol Use: No  . Drug Use: No  . Sexual Activity: Not on file   Other Topics Concern  . Not on file   Social History Narrative  . No narrative on file    Past Surgical History  Procedure Laterality Date  . Knee surgery      left    No  family history on file.  No Known Allergies  Current Outpatient Prescriptions on File Prior to Visit  Medication Sig Dispense Refill  . amLODipine (NORVASC) 10 MG tablet Take 10 mg by mouth daily.       Marland Kitchen amLODipine (NORVASC) 10 MG tablet take 1 tablet by mouth once daily  90 tablet  3  . BYSTOLIC 20 MG TABS take 1 tablet by mouth once daily  30 tablet  6  . CIALIS 20 MG tablet take 1 tablet by mouth once daily if needed for ERECTILE DYSFUNCTION  10 tablet  0  . metFORMIN (GLUCOPHAGE XR) 500 MG 24 hr tablet Take 1 tablet (500 mg total) by mouth daily with breakfast.  90 tablet  6  . potassium chloride SA (KLOR-CON M20) 20 MEQ tablet Take 1 tablet (20 mEq total) by mouth daily. For potassium replacement  90 tablet  3  . TRIBENZOR 40-10-25 MG TABS take 1 tablet by mouth once daily  30 tablet  3   No current facility-administered medications on file prior to visit.    BP 160/84  Pulse 58  Temp(Src) 98.2 F (36.8 C) (Oral)  Resp 20  Wt 219 lb (99.338 kg)  SpO2 98%     Review of Systems  Constitutional: Negative for fever, chills, appetite change and fatigue.  HENT: Negative for congestion, dental problem, ear pain, hearing loss, sore throat, tinnitus, trouble swallowing and  voice change.   Eyes: Negative for pain, discharge and visual disturbance.  Respiratory: Negative for cough, chest tightness, wheezing and stridor.   Cardiovascular: Negative for chest pain, palpitations and leg swelling.  Gastrointestinal: Negative for nausea, vomiting, abdominal pain, diarrhea, constipation, blood in stool and abdominal distention.  Genitourinary: Negative for urgency, hematuria, flank pain, discharge, difficulty urinating and genital sores.  Musculoskeletal: Negative for arthralgias, back pain, gait problem, joint swelling, myalgias and neck stiffness.  Skin: Negative for rash.  Neurological: Positive for light-headedness. Negative for dizziness, syncope, speech difficulty, weakness, numbness  and headaches.  Hematological: Negative for adenopathy. Does not bruise/bleed easily.  Psychiatric/Behavioral: Negative for behavioral problems and dysphoric mood. The patient is not nervous/anxious.        Objective:   Physical Exam  Constitutional: He is oriented to person, place, and time. He appears well-developed.  First arrival 160/84 Repeat blood pressure 140/68  HENT:  Head: Normocephalic.  Right Ear: External ear normal.  Left Ear: External ear normal.  Eyes: Conjunctivae and EOM are normal.  Neck: Normal range of motion.  Cardiovascular: Normal rate and normal heart sounds.   Pulmonary/Chest: Breath sounds normal.  Abdominal: Bowel sounds are normal.  Musculoskeletal: Normal range of motion. He exhibits no edema and no tenderness.  Neurological: He is alert and oriented to person, place, and time.  Psychiatric: He has a normal mood and affect. His behavior is normal.          Assessment & Plan:   Hypertension. Reasonable control. We'll continue present regimen Diabetes mellitus. We'll check an A1c at next visit in 3 months  No change therapy

## 2013-08-04 NOTE — Patient Instructions (Signed)
Limit your sodium (Salt) intake  Please check your blood pressure on a regular basis.  If it is consistently greater than 150/90, please make an office appointment.    It is important that you exercise regularly, at least 20 minutes 3 to 4 times per week.  If you develop chest pain or shortness of breath seek  medical attention.  Return in 3 months for follow-up  

## 2013-09-12 ENCOUNTER — Telehealth: Payer: Self-pay | Admitting: Internal Medicine

## 2013-09-12 MED ORDER — NEBIVOLOL HCL 10 MG PO TABS: 10.0000 mg | ORAL_TABLET | Freq: Two times a day (BID) | ORAL | Status: DC

## 2013-09-12 NOTE — Telephone Encounter (Signed)
Pt pharm is still out of bystolic 20 mg. Pt needs samples

## 2013-09-12 NOTE — Telephone Encounter (Signed)
Pt pharm does not have bystolic 20 mg. Pt would like another rx for bystolic 10 mg take twice a day sent to [facility] [place]

## 2013-09-12 NOTE — Telephone Encounter (Signed)
Called and spoke with pt and pt states that he has tried other pharmacies and the bystolic is on back order there too.  Pt aware new rx for bystolic 10 mg sent to pharmacy for 30 day supply.  Pt coming to pick up samples as well.

## 2013-10-13 ENCOUNTER — Other Ambulatory Visit (INDEPENDENT_AMBULATORY_CARE_PROVIDER_SITE_OTHER): Payer: BC Managed Care – PPO

## 2013-10-13 DIAGNOSIS — Z0000 Encounter for general adult medical examination without abnormal findings: Secondary | ICD-10-CM

## 2013-10-13 LAB — LIPID PANEL
Cholesterol: 155 mg/dL (ref 0–200)
HDL: 53.6 mg/dL (ref >39.00)
LDL CALC: 96 mg/dL (ref 0–99)
Total CHOL/HDL Ratio: 3
Triglycerides: 27 mg/dL (ref 0.0–149.0)
VLDL: 5.4 mg/dL (ref 0.0–40.0)

## 2013-10-13 LAB — POCT URINALYSIS DIPSTICK
Bilirubin, UA: NEGATIVE
Blood, UA: NEGATIVE
Glucose, UA: NEGATIVE
Ketones, UA: NEGATIVE
LEUKOCYTES UA: NEGATIVE
Nitrite, UA: NEGATIVE
PH UA: 5.5
PROTEIN UA: NEGATIVE
Spec Grav, UA: 1.01
UROBILINOGEN UA: 0.2

## 2013-10-13 LAB — HEPATIC FUNCTION PANEL
ALBUMIN: 3.7 g/dL (ref 3.5–5.2)
ALT: 29 U/L (ref 0–53)
AST: 27 U/L (ref 0–37)
Alkaline Phosphatase: 66 U/L (ref 39–117)
Bilirubin, Direct: 0.1 mg/dL (ref 0.0–0.3)
Total Bilirubin: 0.7 mg/dL (ref 0.3–1.2)
Total Protein: 7.1 g/dL (ref 6.0–8.3)

## 2013-10-13 LAB — CBC WITH DIFFERENTIAL/PLATELET
BASOS ABS: 0 10*3/uL (ref 0.0–0.1)
Basophils Relative: 0.6 % (ref 0.0–3.0)
EOS ABS: 0.2 10*3/uL (ref 0.0–0.7)
Eosinophils Relative: 3.4 % (ref 0.0–5.0)
HEMATOCRIT: 42.2 % (ref 39.0–52.0)
Hemoglobin: 14.1 g/dL (ref 13.0–17.0)
LYMPHS ABS: 1.9 10*3/uL (ref 0.7–4.0)
Lymphocytes Relative: 41.2 % (ref 12.0–46.0)
MCHC: 33.5 g/dL (ref 30.0–36.0)
MCV: 84.8 fl (ref 78.0–100.0)
MONO ABS: 0.6 10*3/uL (ref 0.1–1.0)
Monocytes Relative: 12.2 % — ABNORMAL HIGH (ref 3.0–12.0)
NEUTROS ABS: 2 10*3/uL (ref 1.4–7.7)
Neutrophils Relative %: 42.6 % — ABNORMAL LOW (ref 43.0–77.0)
Platelets: 220 10*3/uL (ref 150.0–400.0)
RBC: 4.98 Mil/uL (ref 4.22–5.81)
RDW: 14.5 % (ref 11.5–14.6)
WBC: 4.6 10*3/uL (ref 4.5–10.5)

## 2013-10-13 LAB — MICROALBUMIN / CREATININE URINE RATIO
CREATININE, U: 223.9 mg/dL
MICROALB UR: 1.1 mg/dL (ref 0.0–1.9)
Microalb Creat Ratio: 0.5 mg/g (ref 0.0–30.0)

## 2013-10-13 LAB — BASIC METABOLIC PANEL
BUN: 19 mg/dL (ref 6–23)
CALCIUM: 8.7 mg/dL (ref 8.4–10.5)
CO2: 31 meq/L (ref 19–32)
Chloride: 103 mEq/L (ref 96–112)
Creatinine, Ser: 0.8 mg/dL (ref 0.4–1.5)
GFR: 131.3 mL/min (ref >60.00)
Glucose, Bld: 100 mg/dL — ABNORMAL HIGH (ref 70–99)
POTASSIUM: 4.4 meq/L (ref 3.5–5.1)
Sodium: 139 mEq/L (ref 135–145)

## 2013-10-13 LAB — HEMOGLOBIN A1C: HEMOGLOBIN A1C: 5.6 % (ref 4.6–6.5)

## 2013-10-13 LAB — TSH: TSH: 2.72 u[IU]/mL (ref 0.35–5.50)

## 2013-10-13 LAB — PSA: PSA: 3.94 ng/mL (ref 0.10–4.00)

## 2013-10-13 NOTE — Addendum Note (Signed)
Addended by: Bonnye FavaKWEI, NANA K on: 10/13/2013 08:20 AM   Modules accepted: Orders

## 2013-10-26 ENCOUNTER — Other Ambulatory Visit: Payer: Self-pay | Admitting: Internal Medicine

## 2013-10-27 ENCOUNTER — Other Ambulatory Visit: Payer: Self-pay

## 2013-10-30 ENCOUNTER — Telehealth: Payer: Self-pay | Admitting: Internal Medicine

## 2013-10-30 ENCOUNTER — Other Ambulatory Visit: Payer: Self-pay | Admitting: Internal Medicine

## 2013-10-30 MED ORDER — OLMESARTAN-AMLODIPINE-HCTZ 40-10-25 MG PO TABS: ORAL_TABLET | ORAL | Status: DC

## 2013-10-30 MED ORDER — NEBIVOLOL HCL 10 MG PO TABS: ORAL_TABLET | ORAL | Status: DC

## 2013-10-30 NOTE — Telephone Encounter (Signed)
Pt notified Rx refills done.

## 2013-10-30 NOTE — Telephone Encounter (Signed)
Pt is needing rx TRIBENZOR 40-10-25 MG TABS and bystolic 10 mg, pt states he is out of meds and needs the rx called in today .

## 2013-11-03 ENCOUNTER — Encounter: Payer: Self-pay | Admitting: Internal Medicine

## 2013-11-03 ENCOUNTER — Ambulatory Visit (INDEPENDENT_AMBULATORY_CARE_PROVIDER_SITE_OTHER): Payer: BC Managed Care – PPO | Admitting: Internal Medicine

## 2013-11-03 VITALS — BP 150/74 | HR 61 | Temp 98.5°F | Resp 20 | Ht 67.5 in | Wt 231.0 lb

## 2013-11-03 DIAGNOSIS — K219 Gastro-esophageal reflux disease without esophagitis: Secondary | ICD-10-CM

## 2013-11-03 DIAGNOSIS — F419 Anxiety disorder, unspecified: Secondary | ICD-10-CM

## 2013-11-03 DIAGNOSIS — E119 Type 2 diabetes mellitus without complications: Secondary | ICD-10-CM

## 2013-11-03 DIAGNOSIS — F411 Generalized anxiety disorder: Secondary | ICD-10-CM

## 2013-11-03 DIAGNOSIS — I10 Essential (primary) hypertension: Secondary | ICD-10-CM

## 2013-11-03 DIAGNOSIS — Z0000 Encounter for general adult medical examination without abnormal findings: Secondary | ICD-10-CM

## 2013-11-03 MED ORDER — NEBIVOLOL HCL 20 MG PO TABS: 20.0000 mg | ORAL_TABLET | Freq: Every day | ORAL | Status: DC

## 2013-11-03 NOTE — Progress Notes (Signed)
Pre-visit discussion using our clinic review tool. No additional management support is needed unless otherwise documented below in the visit note.  

## 2013-11-03 NOTE — Progress Notes (Signed)
Subjective:    Patient ID: Javier [name], male    DOB: May 05, 1951, 63 y.o.   MRN: [id number]  HPI  History of Present Illness:   63 -year-old patient who is seen today for a annual exam. Medical consequent hypertension, mild BPH, history of gastroesophageal reflux disease. He has been evaluated by urology due to an elevated PSA.  His PSA had had increased to 4.97. Denies any obstructive symptoms. No cardiopulmonary complaints, laboratory screen otherwise, unremarkable.  His PSA remains normal  Problems Prior to Update:  1) Elevated Prostate Specific Antigen (ICD-790.93)  2) Dysuria (ICD-788.1)  3) Knee Pain, Right (ICD-719.46)  4) Fasciitis, Plantar (ICD-728.71)  5) Family History Breast Cancer 1st Degree Relative <50 (ICD-V16.3)  6) Gerd (ICD-530.81)  7) Benign Prostatic Hypertrophy (ICD-600.00)  8) Hypertension (ICD-401.9)    Allergies:  No Known Drug Allergies   Past History:  Past Medical History:   Hypertension  Benign prostatic hypertrophy  Mild obesity  GERD  right knee pain  Elevated PSA  hypokalemia   Past Surgical History:   Colonoscopy October 2006   Family History:   Family History Breast cancer 1st degree relative <50  Family History Hypertension  Family History Kidney disease  Family History of Cardiovascular disorder  details of his father's health unknown  mother died age 63. History hypertension, and renal failure, heart failure;  Two half-sisters one history of breast cancer   Social History:   Occupation:  Married  Never Smoked  One son ; 3 grandchildren    Review of Systems  Constitutional: Negative for fever, chills, activity change, appetite change and fatigue.  HENT: Negative for congestion, dental problem, ear pain, hearing loss, mouth sores, rhinorrhea, sinus pressure, sneezing, tinnitus, trouble swallowing and voice change.   Eyes: Negative for photophobia, pain, redness and visual disturbance.  Respiratory: Negative for apnea,  cough, choking, chest tightness, shortness of breath and wheezing.   Cardiovascular: Negative for chest pain, palpitations and leg swelling.  Gastrointestinal: Negative for nausea, vomiting, abdominal pain, diarrhea, constipation, blood in stool, abdominal distention, anal bleeding and rectal pain.  Genitourinary: Negative for dysuria, urgency, frequency, hematuria, flank pain, decreased urine volume, discharge, penile swelling, scrotal swelling, difficulty urinating, genital sores and testicular pain.  Musculoskeletal: Negative for arthralgias, back pain, gait problem, joint swelling, myalgias, neck pain and neck stiffness.  Skin: Negative for color change, rash and wound.  Neurological: Negative for dizziness, tremors, seizures, syncope, facial asymmetry, speech difficulty, weakness, light-headedness, numbness and headaches.  Hematological: Negative for adenopathy. Does not bruise/bleed easily.  Psychiatric/Behavioral: Negative for suicidal ideas, hallucinations, behavioral problems, confusion, sleep disturbance, self-injury, dysphoric mood, decreased concentration and agitation. The patient is not nervous/anxious.        Objective:   Physical Exam  Constitutional: He is oriented to person, place, and time. He appears well-developed.  Blood pressure 150/80 right arm and 140 over 70 left arm  HENT:  Head: Normocephalic.  Right Ear: External ear normal.  Left Ear: External ear normal.  Eyes: Conjunctivae and EOM are normal.  Neck: Normal range of motion.  Cardiovascular: Normal rate and normal heart sounds.   Pulmonary/Chest: Breath sounds normal.  Abdominal: Bowel sounds are normal.  Genitourinary: Guaiac negative stool.  Musculoskeletal: Normal range of motion. He exhibits no edema and no tenderness.  Neurological: He is alert and oriented to person, place, and time.  Psychiatric: He has a normal mood and affect. His behavior is normal.          Assessment &  Plan:   Preventive  health exam Hypertension.  Reasonable control.  Multi-drug-resistant hypertension BPH with elevated PSA.  PSA presently normal GERD Mild obesity.  Weight loss encouraged Diabetes.  Well-controlled on metformin therapy

## 2013-11-03 NOTE — Patient Instructions (Signed)

## 2013-11-27 ENCOUNTER — Telehealth: Payer: Self-pay | Admitting: Internal Medicine

## 2013-11-27 MED ORDER — METFORMIN HCL ER 500 MG PO TB24: 500.0000 mg | ORAL_TABLET | Freq: Every day | ORAL | Status: DC

## 2013-11-27 NOTE — Telephone Encounter (Signed)
Pt is needing new rx metFORMIN (GLUCOPHAGE XR) 500 MG 24 hr tablet, sent to [facility] [address], pt states he needs the meds asap he is completely out.

## 2013-11-27 NOTE — Telephone Encounter (Signed)
Pt notified Rx sent to pharmacy

## 2013-12-09 ENCOUNTER — Other Ambulatory Visit: Payer: Self-pay | Admitting: Internal Medicine

## 2013-12-30 ENCOUNTER — Other Ambulatory Visit: Payer: Self-pay | Admitting: Internal Medicine

## 2014-03-03 ENCOUNTER — Telehealth: Payer: Self-pay | Admitting: *Deleted

## 2014-03-03 NOTE — Telephone Encounter (Signed)
Left message on voicemail to call office.  

## 2014-03-11 NOTE — Telephone Encounter (Signed)
Spoke to pt told him I have a coupon for his Tribenzor medication and it will be at the front desk. Pt verbalized understanding and will pickup today.

## 2014-03-23 ENCOUNTER — Encounter: Payer: Self-pay | Admitting: Internal Medicine

## 2014-03-23 ENCOUNTER — Ambulatory Visit (INDEPENDENT_AMBULATORY_CARE_PROVIDER_SITE_OTHER): Payer: BC Managed Care – PPO | Admitting: Internal Medicine

## 2014-03-23 VITALS — BP 140/80 | HR 64 | Temp 98.8°F | Resp 20 | Ht 67.5 in | Wt 231.0 lb

## 2014-03-23 DIAGNOSIS — N529 Male erectile dysfunction, unspecified: Secondary | ICD-10-CM

## 2014-03-23 DIAGNOSIS — I10 Essential (primary) hypertension: Secondary | ICD-10-CM

## 2014-03-23 DIAGNOSIS — N400 Enlarged prostate without lower urinary tract symptoms: Secondary | ICD-10-CM

## 2014-03-23 DIAGNOSIS — E139 Other specified diabetes mellitus without complications: Secondary | ICD-10-CM

## 2014-03-23 DIAGNOSIS — E119 Type 2 diabetes mellitus without complications: Secondary | ICD-10-CM

## 2014-03-23 LAB — HEMOGLOBIN A1C: HEMOGLOBIN A1C: 5.9 % (ref 4.6–6.5)

## 2014-03-23 NOTE — Progress Notes (Signed)
Pre visit review using our clinic review tool, if applicable. No additional management support is needed unless otherwise documented below in the visit note. 

## 2014-03-23 NOTE — Patient Instructions (Signed)
Limit your sodium (Salt) intake  Please check your blood pressure on a regular basis.  If it is consistently greater than 150/90, please make an office appointment.    It is important that you exercise regularly, at least 20 minutes 3 to 4 times per week.  If you develop chest pain or shortness of breath seek  medical attention.  Return in 6 months for follow-up  

## 2014-03-23 NOTE — Progress Notes (Signed)
Subjective:    Patient ID: Javier [name], male    DOB: 05-Nov-1950, 63 y.o.   MRN: [id number]  HPI 63 year old patient who is seen today for followup.  He has a history of type 2 diabetes, which has been under excellent control with metformin therapy only.  He has treated hypertension.  He also has a history of BPH with ADD.  He has a history of elevated PSA, which has normalized.  No concerns or complaints today  A lengthy veterans affairs disability benefits.  Questionnaire completed  Past Medical History  Diagnosis Date  . BENIGN PROSTATIC HYPERTROPHY 02/20/2007  . ELEVATED PROSTATE SPECIFIC ANTIGEN 02/26/2009  . 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 FASCIITIS, PLANTAR 09/30/2007  . GERD 02/20/2007  . HYPERTENSION 02/20/2007  . HYPOKALEMIA 03/02/2009  . Obesity   . Diabetes mellitus   . Heart murmur   . Depression     History   Social History  . Marital Status: Married    Spouse Name: N/A    Number of Children: N/A  . Years of Education: N/A   Occupational History  . Not on file.   Social History Main Topics  . Smoking status: Never Smoker   . Smokeless tobacco: Never Used  . Alcohol Use: No  . Drug Use: No  . Sexual Activity: Not on file   Other Topics Concern  . Not on file   Social History Narrative  . No narrative on file    Past Surgical History  Procedure Laterality Date  . Knee surgery      left    No family history on file.  No Known Allergies  Current Outpatient Prescriptions on File Prior to Visit  Medication Sig Dispense Refill  . amLODipine (NORVASC) 10 MG tablet take 1 tablet by mouth once daily  90 tablet  3  . CIALIS 20 MG tablet take 1 tablet by mouth once daily as directed  10 tablet  0  . metFORMIN (GLUCOPHAGE XR) 500 MG 24 hr tablet Take 1 tablet (500 mg total) by mouth daily with breakfast.  90 tablet  1  . Nebivolol HCl (BYSTOLIC) 20 MG TABS Take 1 tablet (20 mg total) by mouth daily.  90 tablet  11  . Olmesartan-Amlodipine-HCTZ  (TRIBENZOR) 40-10-25 MG TABS take 1 tablet by mouth once daily  30 tablet  5  . potassium chloride SA (K-DUR,KLOR-CON) 20 MEQ tablet take 1 tablet by mouth once daily  90 tablet  3   No current facility-administered medications on file prior to visit.    BP 140/80  Pulse 64  Temp(Src) 98.8 F (37.1 C) (Oral)  Resp 20  Ht 5' 7.5" (1.715 m)  Wt 231 lb (104.781 kg)  BMI 35.62 kg/m2  SpO2 98%    Review of Systems  Constitutional: Negative for fever, chills, appetite change and fatigue.  HENT: Negative for congestion, dental problem, ear pain, hearing loss, sore throat, tinnitus, trouble swallowing and voice change.   Eyes: Negative for pain, discharge and visual disturbance.  Respiratory: Negative for cough, chest tightness, wheezing and stridor.   Cardiovascular: Negative for chest pain, palpitations and leg swelling.  Gastrointestinal: Negative for nausea, vomiting, abdominal pain, diarrhea, constipation, blood in stool and abdominal distention.  Genitourinary: Negative for urgency, hematuria, flank pain, discharge, difficulty urinating and genital sores.  Musculoskeletal: Negative for arthralgias, back pain, gait problem, joint swelling, myalgias and neck stiffness.  Skin: Negative for rash.  Neurological: Negative for dizziness, syncope, speech difficulty, weakness, numbness  and headaches.  Hematological: Negative for adenopathy. Does not bruise/bleed easily.  Psychiatric/Behavioral: Negative for behavioral problems and dysphoric mood. The patient is not nervous/anxious.        Objective:   Physical Exam  Constitutional: He is oriented to person, place, and time. He appears well-developed.  HENT:  Head: Normocephalic.  Right Ear: External ear normal.  Left Ear: External ear normal.  Eyes: Conjunctivae and EOM are normal.  Neck: Normal range of motion.  Cardiovascular: Normal rate and normal heart sounds.   Pulmonary/Chest: Breath sounds normal.  Abdominal: Bowel sounds  are normal.  Musculoskeletal: Normal range of motion. He exhibits no edema and no tenderness.  Neurological: He is alert and oriented to person, place, and time.  Psychiatric: He has a normal mood and affect. His behavior is normal.          Assessment & Plan:  Hypertension well controlled ED.  Refill Cialis BPH only mildly symptomatic Diabetes mellitus.  We'll check a hemoglobin A1c  Recheck 6 months  [employer] if there is disability benefits.  Questionnaire completed with patient's assistance

## 2014-03-24 ENCOUNTER — Telehealth: Payer: Self-pay | Admitting: Internal Medicine

## 2014-03-24 NOTE — Telephone Encounter (Signed)
Relevant patient education mailed to patient.  

## 2014-05-04 ENCOUNTER — Ambulatory Visit: Payer: Self-pay | Admitting: Internal Medicine

## 2014-05-23 ENCOUNTER — Other Ambulatory Visit: Payer: Self-pay | Admitting: Internal Medicine

## 2014-06-06 ENCOUNTER — Other Ambulatory Visit: Payer: Self-pay | Admitting: Internal Medicine

## 2014-06-11 ENCOUNTER — Other Ambulatory Visit: Payer: Self-pay | Admitting: Internal Medicine

## 2014-07-15 ENCOUNTER — Ambulatory Visit (INDEPENDENT_AMBULATORY_CARE_PROVIDER_SITE_OTHER): Payer: BC Managed Care – PPO

## 2014-07-15 DIAGNOSIS — Z23 Encounter for immunization: Secondary | ICD-10-CM

## 2014-08-31 ENCOUNTER — Ambulatory Visit: Payer: Self-pay | Admitting: Family Medicine

## 2014-09-16 ENCOUNTER — Telehealth: Payer: Self-pay | Admitting: Internal Medicine

## 2014-09-16 ENCOUNTER — Telehealth: Payer: Self-pay | Admitting: *Deleted

## 2014-09-16 MED ORDER — PROMETHAZINE HCL 25 MG RE SUPP: 25.0000 mg | Freq: Four times a day (QID) | RECTAL | Status: DC | PRN

## 2014-09-16 NOTE — Telephone Encounter (Signed)
Pt is calling to report on 09/14/14 he started getting stomach cramps and diarrhea.  He took imodium and the diarrhea has resolved but yesterday he started getting nauseated and vomited.  No vomiting today but he is nauseated and the stomach cramps are coming back.  He is diabetic, takes metformin and wants to know what he could he eat and if Dr Kirtland BouchardK could give him something for the nausea.  Per Dr Kirtland BouchardK ok to give pt phenergan 25 mg suppository every 6 hours prn for nausea or vomiting #12/ no refills.  If he is not feeling better then call back tomorrow and Dr Kirtland BouchardK will work him in.  Told pt to eat a clear liquid diet and could resume solid foods 24 hours after last vomit.  Per Dr Kirtland BouchardK metformin is not going to drop blood sugar so he does need to follow the clear liquid diet.  Pt verbalized understanding and had no questions.  [facility]

## 2014-09-21 ENCOUNTER — Ambulatory Visit (INDEPENDENT_AMBULATORY_CARE_PROVIDER_SITE_OTHER): Payer: BLUE CROSS/BLUE SHIELD | Admitting: Internal Medicine

## 2014-09-21 ENCOUNTER — Telehealth: Payer: Self-pay | Admitting: Internal Medicine

## 2014-09-21 ENCOUNTER — Encounter: Payer: Self-pay | Admitting: Internal Medicine

## 2014-09-21 VITALS — BP 138/64 | HR 60 | Temp 98.3°F | Resp 20 | Ht 67.5 in | Wt 224.0 lb

## 2014-09-21 DIAGNOSIS — F418 Other specified anxiety disorders: Secondary | ICD-10-CM

## 2014-09-21 DIAGNOSIS — I10 Essential (primary) hypertension: Secondary | ICD-10-CM

## 2014-09-21 DIAGNOSIS — E119 Type 2 diabetes mellitus without complications: Secondary | ICD-10-CM

## 2014-09-21 MED ORDER — OLMESARTAN-AMLODIPINE-HCTZ 40-10-25 MG PO TABS: ORAL_TABLET | ORAL | Status: DC

## 2014-09-21 NOTE — Telephone Encounter (Signed)
PLEASE NOTE: All timestamps contained within this report are represented as [place] Standard Time. CONFIDENTIALTY NOTICE: This fax transmission is intended only for the addressee. It contains information that is legally privileged, confidential or otherwise protected from use or disclosure. If you are not the intended recipient, you are strictly prohibited from reviewing, disclosing, copying using or disseminating any of this information or taking any action in reliance on or regarding this information. If you have received this fax in error, please notify us immediately by telephone so that we can arrange for its return to [place]. Phone: [phone], Toll-Free: [phone], Fax: [phone] Page: 1 of 2 Call Id: [id number] [facility] - Client TELEPHONE ADVICE RECORD [facility] Patient Name: Javier [name] Gender: Male DOB: 20-Jul-1951 Age: 64 Y 19 M 2 D Return Phone Number: [phone] (Primary) Address: [address] City/State/Zip: [place] [address] Client [facility] - Client Client Site [facility] Day Physician [place] Contact Type Call Call Type Triage / Clinical Relationship To Patient Self Return Phone Number [phone] (Primary) Chief Complaint Abdominal Pain Initial Comment Caller states, , diarrhea, vomiting, yesterday-- nauseated, abd cramps, Mild cramps, PreDisposition Did not know what to do Nurse Assessment Nurse: Kemper Durie, RN, Lurena Joiner Date/Time Lamount Cohen Time): 09/16/2014 12:02:20 PM Confirm and document reason for call. If symptomatic, describe symptoms. ---Caller stats n/v/d since 12/28 Temp 100. Last urine 10 minutes ago. Has the patient traveled out of the country within the last 30 days? ---Not Applicable Does the patient require triage? ---Yes Related visit to physician within the last 2 weeks? ---No Does the PT have any chronic conditions? (i.e.  diabetes, asthma, etc.) ---Yes List chronic conditions. ---diabetes, HTN. Guidelines Guideline Title Affirmed Question Affirmed Notes Nurse Date/Time Lamount Cohen Time) Abdominal Pain - Male [1] MILD-MODERATE pain AND [2] constant AND [3] present > 2 hours Kemper Durie, RN, Lurena Joiner 09/16/2014 12:04:22 PM Disp. Time Lamount Cohen Time) Disposition Final User 09/16/2014 12:07:00 PM See Physician within 4 Hours (or PCP triage) Yes Kemper Durie, RN, Cecil Cobbs Understands: Yes Disagree/Comply: Comply PLEASE NOTE: All timestamps contained within this report are represented as [place] Standard Time. CONFIDENTIALTY NOTICE: This fax transmission is intended only for the addressee. It contains information that is legally privileged, confidential or otherwise protected from use or disclosure. If you are not the intended recipient, you are strictly prohibited from reviewing, disclosing, copying using or disseminating any of this information or taking any action in reliance on or regarding this information. If you have received this fax in error, please notify us immediately by telephone so that we can arrange for its return to [place]. Phone: [phone], Toll-Free: [phone], Fax: [phone] Page: 2 of 2 Call Id: [id number] Care Advice Given Per Guideline SEE PHYSICIAN WITHIN 4 HOURS (or PCP triage): * IF NO PCP TRIAGE: You need to be seen. Go to _______________ (ED/UCC or office if it will be open) within the next 3 or 4 hours. Go sooner if you become worse. CALL BACK IF: * You become worse. CARE ADVICE given per Abdominal Pain, Male (Adult) guideline. After Care Instructions Given Call Event Type User Date / Time Description Referrals REFERRED TO PCP OFFICE

## 2014-09-21 NOTE — Patient Instructions (Signed)
Limit your sodium (Salt) intake    It is important that you exercise regularly, at least 20 minutes 3 to 4 times per week.  If you develop chest pain or shortness of breath seek  medical attention.  Please check your blood pressure on a regular basis.  If it is consistently greater than 150/90, please make an office appointment.  Return in 6 months for follow-up  Please see your eye doctor yearly to check for diabetic eye damage

## 2014-09-21 NOTE — Progress Notes (Signed)
Pre visit review using our clinic review tool, if applicable. No additional management support is needed unless otherwise documented below in the visit note. 

## 2014-09-21 NOTE — Progress Notes (Signed)
Subjective:    Patient ID: Javier [name], male    DOB: 10-18-1950, 64 y.o.   MRN: [id number]  HPI BP Readings from Last 3 Encounters:  03/23/14 140/80  11/03/13 150/74  08/04/13 160/84    Lab Results  Component Value Date   HGBA1C 5.9 03/23/2014   64 year old patient who is seen today for follow-up.  He has a history of type 2 diabetes which has been tightly controlled on low-dose metformin therapy only.  He has multi-drug-resistant hypertension which has been well controlled.  Medical regimen includes 20 mg of amlodipine.  He does describe some rare dizziness, but no edema. He does monitor home blood sugar readings and blood pressures with the nice control  Past Medical History  Diagnosis Date  . BENIGN PROSTATIC HYPERTROPHY 02/20/2007  . ELEVATED PROSTATE SPECIFIC ANTIGEN 02/26/2009  . 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 FASCIITIS, PLANTAR 09/30/2007  . GERD 02/20/2007  . HYPERTENSION 02/20/2007  . HYPOKALEMIA 03/02/2009  . Obesity   . Diabetes mellitus   . Heart murmur   . Depression     History   Social History  . Marital Status: Married    Spouse Name: N/A    Number of Children: N/A  . Years of Education: N/A   Occupational History  . Not on file.   Social History Main Topics  . Smoking status: Never Smoker   . Smokeless tobacco: Never Used  . Alcohol Use: No  . Drug Use: No  . Sexual Activity: Not on file   Other Topics Concern  . Not on file   Social History Narrative    Past Surgical History  Procedure Laterality Date  . Knee surgery      left    No family history on file.  No Known Allergies  Current Outpatient Prescriptions on File Prior to Visit  Medication Sig Dispense Refill  . amLODipine (NORVASC) 10 MG tablet take 1 tablet by mouth once daily 90 tablet 1  . CIALIS 20 MG tablet take 1 tablet by mouth once daily as directed 10 tablet 0  . metFORMIN (GLUCOPHAGE-XR) 500 MG 24 hr tablet TAKE ONE TABLET BY MOUTH DAILY WITH BREAKFAST 90  tablet 1  . Nebivolol HCl (BYSTOLIC) 20 MG TABS Take 1 tablet (20 mg total) by mouth daily. 90 tablet 11  . Olmesartan-Amlodipine-HCTZ (TRIBENZOR) 40-10-25 MG TABS take 1 tablet by mouth once daily 30 tablet 5  . potassium chloride SA (K-DUR,KLOR-CON) 20 MEQ tablet take 1 tablet by mouth once daily 90 tablet 3  . promethazine (PHENERGAN) 25 MG suppository Place 1 suppository (25 mg total) rectally every 6 (six) hours as needed for nausea or vomiting. 12 each 0   No current facility-administered medications on file prior to visit.    BP 138/64 mmHg  Pulse 60  Temp(Src) 98.3 F (36.8 C) (Oral)  Resp 20  Ht 5' 7.5" (1.715 m)  Wt 224 lb (101.606 kg)  BMI 34.55 kg/m2  SpO2 98%     Review of Systems  Constitutional: Negative for fever, chills, appetite change and fatigue.  HENT: Negative for congestion, dental problem, ear pain, hearing loss, sore throat, tinnitus, trouble swallowing and voice change.   Eyes: Negative for pain, discharge and visual disturbance.  Respiratory: Negative for cough, chest tightness, wheezing and stridor.   Cardiovascular: Negative for chest pain, palpitations and leg swelling.  Gastrointestinal: Negative for nausea, vomiting, abdominal pain, diarrhea, constipation, blood in stool and abdominal distention.  Genitourinary: Negative for urgency, hematuria,  flank pain, discharge, difficulty urinating and genital sores.  Musculoskeletal: Negative for myalgias, back pain, joint swelling, arthralgias, gait problem and neck stiffness.  Skin: Negative for rash.  Neurological: Positive for dizziness and light-headedness. Negative for syncope, speech difficulty, weakness, numbness and headaches.  Hematological: Negative for adenopathy. Does not bruise/bleed easily.  Psychiatric/Behavioral: Negative for behavioral problems and dysphoric mood. The patient is not nervous/anxious.        Objective:   Physical Exam  Constitutional: He is oriented to person, place, and  time. He appears well-developed.  Blood pressure 140/70  HENT:  Head: Normocephalic.  Right Ear: External ear normal.  Left Ear: External ear normal.  Eyes: Conjunctivae and EOM are normal.  Neck: Normal range of motion.  Cardiovascular: Normal rate and normal heart sounds.   Pulmonary/Chest: Breath sounds normal.  Abdominal: Bowel sounds are normal.  Musculoskeletal: Normal range of motion. He exhibits no edema or tenderness.  Neurological: He is alert and oriented to person, place, and time.  Psychiatric: He has a normal mood and affect. His behavior is normal.          Assessment & Plan:  Hypertension.  Will give a patient a trial of decreased dose of amlodipine 10 mg daily.  If blood pressure increases, will resume present regimen Type 2 diabetes, well controlled.  Will check a hemoglobin A1c  CPX 6 months Continue low-salt diet and home blood pressure follow-up

## 2014-09-21 NOTE — Telephone Encounter (Signed)
PLEASE NOTE: All timestamps contained within this report are represented as [place] Standard Time. CONFIDENTIALTY NOTICE: This fax transmission is intended only for the addressee. It contains information that is legally privileged, confidential or otherwise protected from use or disclosure. If you are not the intended recipient, you are strictly prohibited from reviewing, disclosing, copying using or disseminating any of this information or taking any action in reliance on or regarding this information. If you have received this fax in error, please notify us immediately by telephone so that we can arrange for its return to [place]. Phone: [phone], Toll-Free: [phone], Fax: [phone] Page: 1 of 1 Call Id: [id number] [facility] - Client TELEPHONE ADVICE RECORD [facility] Patient Name: Javier [name] Gender: Male DOB: 1951-02-26 Age: 89 Y 9 M 7 D Return Phone Number: [phone] (Primary) Address: [address] City/State/Zip: [place] [address] Client [facility] - Client Client Site [facility] Day Physician [place] Contact Type Call Call Type Triage / Clinical Relationship To Patient Self Return Phone Number [phone] (Primary) Chief Complaint Health information question (non symptomatic) Initial Comment Caller states questions abt his med. Nurse Assessment Nurse: Deatra James RN, Corrie Dandy Date/Time Lamount Cohen Time): 09/21/2014 12:17:23 PM Confirm and document reason for call. If symptomatic, describe symptoms. ---Caller states his doctor gave him some suppositories for his vomiting and diarrhea last week and now his wife has the same illness asking if she can use his left over medicine. He states she is not a patient and her doctor is in [place]. Has the patient traveled out of the country within the last 30 days? ---No Does the patient require triage? ---No Please document clinical  information provided and list any resource used. ---Caller instructed that I can not prescribe medicine and I do not know her health history or medicines. Instructed him that he needs to call his wife's doctor and ask them if she can take a medicine. Guidelines Guideline Title Affirmed Question Affirmed Notes Nurse Date/Time (Eastern Time) Disp. Time Lamount Cohen Time) Disposition Final User 09/21/2014 12:29:33 PM Clinical Call Yes Deatra James, RN, Corrie Dandy After Care Instructions Given Call Event Type User Date / Time Description

## 2014-11-05 ENCOUNTER — Other Ambulatory Visit: Payer: Self-pay | Admitting: Internal Medicine

## 2014-11-26 ENCOUNTER — Other Ambulatory Visit: Payer: Self-pay | Admitting: Internal Medicine

## 2014-12-09 ENCOUNTER — Other Ambulatory Visit: Payer: Self-pay | Admitting: Internal Medicine

## 2014-12-14 ENCOUNTER — Telehealth: Payer: Self-pay | Admitting: Internal Medicine

## 2014-12-14 MED ORDER — AMLODIPINE BESYLATE 10 MG PO TABS: 10.0000 mg | ORAL_TABLET | Freq: Every day | ORAL | Status: DC

## 2014-12-14 NOTE — Telephone Encounter (Signed)
Spoke to pt, told him Rx was sent on 3/24 but I will send it again. Asked pt what blood pressure was? Pt said it was running 150-160 on top and is better since back on amlodipine. Told pt okay Rx sent. Pt verbalized understanding.

## 2014-12-14 NOTE — Telephone Encounter (Signed)
Per pt he was told to resume amlodipine if bp spikes. Pt bp has spike and he needs a refill on amlodipine call [facility] [address].

## 2014-12-24 ENCOUNTER — Other Ambulatory Visit: Payer: Self-pay | Admitting: Internal Medicine

## 2015-03-15 ENCOUNTER — Other Ambulatory Visit (INDEPENDENT_AMBULATORY_CARE_PROVIDER_SITE_OTHER): Payer: BLUE CROSS/BLUE SHIELD

## 2015-03-15 DIAGNOSIS — Z0000 Encounter for general adult medical examination without abnormal findings: Secondary | ICD-10-CM

## 2015-03-15 LAB — POCT URINALYSIS DIPSTICK
Bilirubin, UA: NEGATIVE
GLUCOSE UA: NEGATIVE
Ketones, UA: NEGATIVE
Leukocytes, UA: NEGATIVE
Nitrite, UA: NEGATIVE
Protein, UA: NEGATIVE
RBC UA: NEGATIVE
Spec Grav, UA: 1.02
Urobilinogen, UA: 0.2
pH, UA: 7

## 2015-03-15 LAB — CBC WITH DIFFERENTIAL/PLATELET
Basophils Absolute: 0 10*3/uL (ref 0.0–0.1)
Basophils Relative: 0.6 % (ref 0.0–3.0)
EOS PCT: 3.7 % (ref 0.0–5.0)
Eosinophils Absolute: 0.2 10*3/uL (ref 0.0–0.7)
HCT: 42.6 % (ref 39.0–52.0)
HEMOGLOBIN: 14.4 g/dL (ref 13.0–17.0)
LYMPHS ABS: 1.9 10*3/uL (ref 0.7–4.0)
Lymphocytes Relative: 41.3 % (ref 12.0–46.0)
MCHC: 33.9 g/dL (ref 30.0–36.0)
MCV: 84.6 fl (ref 78.0–100.0)
Monocytes Absolute: 0.5 10*3/uL (ref 0.1–1.0)
Monocytes Relative: 11.3 % (ref 3.0–12.0)
NEUTROS ABS: 2 10*3/uL (ref 1.4–7.7)
NEUTROS PCT: 43.1 % (ref 43.0–77.0)
Platelets: 219 10*3/uL (ref 150.0–400.0)
RBC: 5.03 Mil/uL (ref 4.22–5.81)
RDW: 15.3 % (ref 11.5–15.5)
WBC: 4.7 10*3/uL (ref 4.0–10.5)

## 2015-03-15 LAB — BASIC METABOLIC PANEL
BUN: 15 mg/dL (ref 6–23)
CO2: 31 mEq/L (ref 19–32)
Calcium: 8.8 mg/dL (ref 8.4–10.5)
Chloride: 103 mEq/L (ref 96–112)
Creatinine, Ser: 0.73 mg/dL (ref 0.40–1.50)
GFR: 139 mL/min (ref >60.00)
GLUCOSE: 112 mg/dL — AB (ref 70–99)
POTASSIUM: 3.9 meq/L (ref 3.5–5.1)
Sodium: 139 mEq/L (ref 135–145)

## 2015-03-15 LAB — HEPATIC FUNCTION PANEL
ALBUMIN: 3.6 g/dL (ref 3.5–5.2)
ALT: 23 U/L (ref 0–53)
AST: 23 U/L (ref 0–37)
Alkaline Phosphatase: 67 U/L (ref 39–117)
Bilirubin, Direct: 0.1 mg/dL (ref 0.0–0.3)
Total Bilirubin: 0.6 mg/dL (ref 0.2–1.2)
Total Protein: 6.8 g/dL (ref 6.0–8.3)

## 2015-03-15 LAB — MICROALBUMIN / CREATININE URINE RATIO
CREATININE, U: 183.5 mg/dL
MICROALB UR: 1 mg/dL (ref 0.0–1.9)
MICROALB/CREAT RATIO: 0.5 mg/g (ref 0.0–30.0)

## 2015-03-15 LAB — LIPID PANEL
CHOL/HDL RATIO: 3
CHOLESTEROL: 164 mg/dL (ref 0–200)
HDL: 50 mg/dL (ref >39.00)
LDL CALC: 106 mg/dL — AB (ref 0–99)
NonHDL: 114
Triglycerides: 42 mg/dL (ref 0.0–149.0)
VLDL: 8.4 mg/dL (ref 0.0–40.0)

## 2015-03-15 LAB — HEMOGLOBIN A1C: Hgb A1c MFr Bld: 5.7 % (ref 4.6–6.5)

## 2015-03-15 LAB — TSH: TSH: 2.91 u[IU]/mL (ref 0.35–4.50)

## 2015-03-15 LAB — PSA: PSA: 2.97 ng/mL (ref 0.10–4.00)

## 2015-03-29 ENCOUNTER — Ambulatory Visit (INDEPENDENT_AMBULATORY_CARE_PROVIDER_SITE_OTHER): Payer: BLUE CROSS/BLUE SHIELD | Admitting: Internal Medicine

## 2015-03-29 ENCOUNTER — Encounter: Payer: Self-pay | Admitting: Internal Medicine

## 2015-03-29 VITALS — BP 130/70 | HR 54 | Temp 98.9°F | Resp 20 | Ht 66.75 in | Wt 230.0 lb

## 2015-03-29 DIAGNOSIS — E119 Type 2 diabetes mellitus without complications: Secondary | ICD-10-CM | POA: Diagnosis not present

## 2015-03-29 DIAGNOSIS — Z0000 Encounter for general adult medical examination without abnormal findings: Secondary | ICD-10-CM | POA: Diagnosis not present

## 2015-03-29 DIAGNOSIS — I10 Essential (primary) hypertension: Secondary | ICD-10-CM | POA: Diagnosis not present

## 2015-03-29 MED ORDER — METFORMIN HCL ER 500 MG PO TB24: ORAL_TABLET | ORAL | Status: DC

## 2015-03-29 MED ORDER — POTASSIUM CHLORIDE CRYS ER 20 MEQ PO TBCR: 20.0000 meq | EXTENDED_RELEASE_TABLET | Freq: Every day | ORAL | Status: DC

## 2015-03-29 MED ORDER — TADALAFIL 20 MG PO TABS: 20.0000 mg | ORAL_TABLET | Freq: Every day | ORAL | Status: DC

## 2015-03-29 MED ORDER — NEBIVOLOL HCL 10 MG PO TABS: 10.0000 mg | ORAL_TABLET | Freq: Two times a day (BID) | ORAL | Status: DC

## 2015-03-29 MED ORDER — AMLODIPINE BESYLATE 10 MG PO TABS: 10.0000 mg | ORAL_TABLET | Freq: Every day | ORAL | Status: DC

## 2015-03-29 MED ORDER — OLMESARTAN-AMLODIPINE-HCTZ 40-10-25 MG PO TABS: ORAL_TABLET | ORAL | Status: DC

## 2015-03-29 NOTE — Progress Notes (Signed)
Subjective:    Patient ID: Javier [name], male    DOB: [birth year], 64 y.o.   MRN: [id number]  HPI  Wt Readings from Last 3 Encounters:  03/29/15 230 lb (104.327 kg)  09/21/14 224 lb (101.606 kg)  03/23/14 231 lb (104.69 kg)   64 year old patient who is seen today for a wellness exam  Past Medical History  Diagnosis Date  . BENIGN PROSTATIC HYPERTROPHY 02/20/2007  . ELEVATED PROSTATE SPECIFIC ANTIGEN 02/26/2009  . 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 FASCIITIS, PLANTAR 09/30/2007  . GERD 02/20/2007  . HYPERTENSION 02/20/2007  . HYPOKALEMIA 03/02/2009  . Obesity   . Diabetes mellitus   . Heart murmur   . Depression     History   Social History  . Marital Status: Married    Spouse Name: N/A  . Number of Children: N/A  . Years of Education: N/A   Occupational History  . Not on file.   Social History Main Topics  . Smoking status: Never Smoker   . Smokeless tobacco: Never Used  . Alcohol Use: No  . Drug Use: No  . Sexual Activity: Not on file   Other Topics Concern  . Not on file   Social History Narrative    Past Surgical History  Procedure Laterality Date  . Knee surgery      left    No family history on file.  No Known Allergies  No current outpatient prescriptions on file prior to visit.   No current facility-administered medications on file prior to visit.    BP 142/70 mmHg  Pulse 54  Temp(Src) 98.9 F (37.2 C) (Oral)  Resp 20  Ht 5' 6.75" (1.695 m)  Wt 230 lb (104.327 kg)  BMI 36.31 kg/m2  SpO2 96%      Review of Systems  Constitutional: Negative for fever, chills, activity change, appetite change and fatigue.  HENT: Negative for congestion, dental problem, ear pain, hearing loss, mouth sores, rhinorrhea, sinus pressure, sneezing, tinnitus, trouble swallowing and voice change.   Eyes: Negative for photophobia, pain, redness and visual disturbance.  Respiratory: Negative for apnea, cough, choking, chest tightness, shortness of  breath and wheezing.   Cardiovascular: Negative for chest pain, palpitations and leg swelling.  Gastrointestinal: Negative for nausea, vomiting, abdominal pain, diarrhea, constipation, blood in stool, abdominal distention, anal bleeding and rectal pain.  Genitourinary: Negative for dysuria, urgency, frequency, hematuria, flank pain, decreased urine volume, discharge, penile swelling, scrotal swelling, difficulty urinating, genital sores and testicular pain.  Musculoskeletal: Negative for myalgias, back pain, joint swelling, arthralgias, gait problem, neck pain and neck stiffness.  Skin: Negative for color change, rash and wound.  Neurological: Negative for dizziness, tremors, seizures, syncope, facial asymmetry, speech difficulty, weakness, light-headedness, numbness and headaches.  Hematological: Negative for adenopathy. Does not bruise/bleed easily.  Psychiatric/Behavioral: Negative for suicidal ideas, hallucinations, behavioral problems, confusion, sleep disturbance, self-injury, dysphoric mood, decreased concentration and agitation. The patient is not nervous/anxious.        Objective:   Physical Exam  Constitutional: He appears well-developed and well-nourished.  Weight 2:30 Blood pressure 130/70  HENT:  Head: Normocephalic and atraumatic.  Right Ear: External ear normal.  Left Ear: External ear normal.  Nose: Nose normal.  Mouth/Throat: Oropharynx is clear and moist.  Pharyngeal crowding  Eyes: Conjunctivae and EOM are normal. Pupils are equal, round, and reactive to light. No scleral icterus.  Neck: Normal range of motion. Neck supple. No JVD present. No thyromegaly present.  Cardiovascular: Regular rhythm, normal  heart sounds and intact distal pulses.  Exam reveals no gallop and no friction rub.   No murmur heard. Pulmonary/Chest: Effort normal and breath sounds normal. He exhibits no tenderness.  Abdominal: Soft. Bowel sounds are normal. He exhibits no distension and no mass.  There is no tenderness.  Genitourinary: Prostate normal and penis normal. Guaiac negative stool.  Musculoskeletal: Normal range of motion. He exhibits no edema or tenderness.  Lymphadenopathy:    He has no cervical adenopathy.  Neurological: He is alert. He has normal reflexes. No cranial nerve deficit. Coordination normal.  Skin: Skin is warm and dry. No rash noted.  Psychiatric: He has a normal mood and affect. His behavior is normal.          Assessment & Plan:   Preventive health examination Diabetes, well controlled Hypertension, well-controlled  Nonpharmacologic measures discussed at length including weight loss exercise and better dietary habits.  Recheck 6 months  Needs follow-up colonoscopy

## 2015-03-29 NOTE — Progress Notes (Signed)
Pre visit review using our clinic review tool, if applicable. No additional management support is needed unless otherwise documented below in the visit note. 

## 2015-03-29 NOTE — Patient Instructions (Signed)
You need to lose weight.  Consider a lower calorie diet and regular exercise.  Limit your sodium (Salt) intake    It is important that you exercise regularly, at least 20 minutes 3 to 4 times per week.  If you develop chest pain or shortness of breath seek  medical attention.  Return in 6 months for follow-up  Schedule your colonoscopy to help detect colon cancer.  Health Maintenance A healthy lifestyle and preventative care can promote health and wellness.  Maintain regular health, dental, and eye exams.  Eat a healthy diet. Foods like vegetables, fruits, whole grains, low-fat dairy products, and lean protein foods contain the nutrients you need and are low in calories. Decrease your intake of foods high in solid fats, added sugars, and salt. Get information about a proper diet from your health care provider, if necessary.  Regular physical exercise is one of the most important things you can do for your health. Most adults should get at least 150 minutes of moderate-intensity exercise (any activity that increases your heart rate and causes you to sweat) each week. In addition, most adults need muscle-strengthening exercises on 2 or more days a week.   Maintain a healthy weight. The body mass index (BMI) is a screening tool to identify possible weight problems. It provides an estimate of body fat based on height and weight. Your health care provider can find your BMI and can help you achieve or maintain a healthy weight. For males 20 years and older:  A BMI below 18.5 is considered underweight.  A BMI of 18.5 to 24.9 is normal.  A BMI of 25 to 29.9 is considered overweight.  A BMI of 30 and above is considered obese.  Maintain normal blood lipids and cholesterol by exercising and minimizing your intake of saturated fat. Eat a balanced diet with plenty of fruits and vegetables. Blood tests for lipids and cholesterol should begin at age 64 and be repeated every 5 years. If your lipid or  cholesterol levels are high, you are over age 450, or you are at high risk for heart disease, you may need your cholesterol levels checked more frequently.Ongoing high lipid and cholesterol levels should be treated with medicines if diet and exercise are not working.  If you smoke, find out from your health care provider how to quit. If you do not use tobacco, do not start.  Lung cancer screening is recommended for adults aged 55-80 years who are at high risk for developing lung cancer because of a history of smoking. A yearly low-dose CT scan of the lungs is recommended for people who have at least a 30-pack-year history of smoking and are current smokers or have quit within the past 15 years. A pack year of smoking is smoking an average of 1 pack of cigarettes a day for 1 year (for example, a 30-pack-year history of smoking could mean smoking 1 pack a day for 30 years or 2 packs a day for 15 years). Yearly screening should continue until the smoker has stopped smoking for at least 15 years. Yearly screening should be stopped for people who develop a health problem that would prevent them from having lung cancer treatment.  If you choose to drink alcohol, do not have more than 2 drinks per day. One drink is considered to be 12 oz (360 mL) of beer, 5 oz (150 mL) of wine, or 1.5 oz (45 mL) of liquor.  Avoid the use of street drugs. Do not share  needles with anyone. Ask for help if you need support or instructions about stopping the use of drugs.  High blood pressure causes heart disease and increases the risk of stroke. Blood pressure should be checked at least every 1-2 years. Ongoing high blood pressure should be treated with medicines if weight loss and exercise are not effective.  If you are 42-72 years old, ask your health care provider if you should take aspirin to prevent heart disease.  Diabetes screening involves taking a blood sample to check your fasting blood sugar level. This should be done  once every 3 years after age 26 if you are at a normal weight and without risk factors for diabetes. Testing should be considered at a younger age or be carried out more frequently if you are overweight and have at least 1 risk factor for diabetes.  Colorectal cancer can be detected and often prevented. Most routine colorectal cancer screening begins at the age of 2 and continues through age 78. However, your health care provider may recommend screening at an earlier age if you have risk factors for colon cancer. On a yearly basis, your health care provider may provide home test kits to check for hidden blood in the stool. A small camera at the end of a tube may be used to directly examine the colon (sigmoidoscopy or colonoscopy) to detect the earliest forms of colorectal cancer. Talk to your health care provider about this at age 64 when routine screening begins. A direct exam of the colon should be repeated every 5-10 years through age 63, unless early forms of precancerous polyps or small growths are found.  People who are at an increased risk for hepatitis B should be screened for this virus. You are considered at high risk for hepatitis B if:  You were born in a country where hepatitis B occurs often. Talk with your health care provider about which countries are considered high risk.  Your parents were born in a high-risk country and you have not received a shot to protect against hepatitis B (hepatitis B vaccine).  You have HIV or AIDS.  You use needles to inject street drugs.  You live with, or have sex with, someone who has hepatitis B.  You are a man who has sex with other men (MSM).  You get hemodialysis treatment.  You take certain medicines for conditions like cancer, organ transplantation, and autoimmune conditions.  Hepatitis C blood testing is recommended for all people born from [birth year] through 1965 and any individual with known risk factors for hepatitis C.  Healthy men should  no longer receive prostate-specific antigen (PSA) blood tests as part of routine cancer screening. Talk to your health care provider about prostate cancer screening.  Testicular cancer screening is not recommended for adolescents or adult males who have no symptoms. Screening includes self-exam, a health care provider exam, and other screening tests. Consult with your health care provider about any symptoms you have or any concerns you have about testicular cancer.  Practice safe sex. Use condoms and avoid high-risk sexual practices to reduce the spread of sexually transmitted infections (STIs).  You should be screened for STIs, including gonorrhea and chlamydia if:  You are sexually active and are younger than 24 years.  You are older than 24 years, and your health care provider tells you that you are at risk for this type of infection.  Your sexual activity has changed since you were last screened, and you are at an increased  risk for chlamydia or gonorrhea. Ask your health care provider if you are at risk.  If you are at risk of being infected with HIV, it is recommended that you take a prescription medicine daily to prevent HIV infection. This is called pre-exposure prophylaxis (PrEP). You are considered at risk if:  You are a man who has sex with other men (MSM).  You are a heterosexual man who is sexually active with multiple partners.  You take drugs by injection.  You are sexually active with a partner who has HIV.  Talk with your health care provider about whether you are at high risk of being infected with HIV. If you choose to begin PrEP, you should first be tested for HIV. You should then be tested every 3 months for as long as you are taking PrEP.  Use sunscreen. Apply sunscreen liberally and repeatedly throughout the day. You should seek shade when your shadow is shorter than you. Protect yourself by wearing long sleeves, pants, a wide-brimmed hat, and sunglasses year round  whenever you are outdoors.  Tell your health care provider of new moles or changes in moles, especially if there is a change in shape or color. Also, tell your health care provider if a mole is larger than the size of a pencil eraser.  A one-time screening for abdominal aortic aneurysm (AAA) and surgical repair of large AAAs by ultrasound is recommended for men aged 32-75 years who are current or former smokers.  Stay current with your vaccines (immunizations). Document Released: 03/02/2008 Document Revised: 09/09/2013 Document Reviewed: 01/30/2011 [facility] Patient Information 2015 [place], [place]. This information is not intended to replace advice given to you by your health care provider. Make sure you discuss any questions you have with your health care provider.

## 2015-05-11 ENCOUNTER — Encounter: Payer: Self-pay | Admitting: Gastroenterology

## 2015-09-27 ENCOUNTER — Ambulatory Visit: Payer: BLUE CROSS/BLUE SHIELD | Admitting: Internal Medicine

## 2015-10-04 ENCOUNTER — Ambulatory Visit (INDEPENDENT_AMBULATORY_CARE_PROVIDER_SITE_OTHER): Payer: BLUE CROSS/BLUE SHIELD | Admitting: Internal Medicine

## 2015-10-04 ENCOUNTER — Encounter: Payer: Self-pay | Admitting: Internal Medicine

## 2015-10-04 VITALS — BP 150/70 | HR 54 | Temp 98.7°F | Resp 20 | Ht 66.75 in | Wt 237.0 lb

## 2015-10-04 DIAGNOSIS — R7303 Prediabetes: Secondary | ICD-10-CM | POA: Insufficient documentation

## 2015-10-04 DIAGNOSIS — I10 Essential (primary) hypertension: Secondary | ICD-10-CM | POA: Diagnosis not present

## 2015-10-04 DIAGNOSIS — N529 Male erectile dysfunction, unspecified: Secondary | ICD-10-CM

## 2015-10-04 DIAGNOSIS — E119 Type 2 diabetes mellitus without complications: Secondary | ICD-10-CM

## 2015-10-04 DIAGNOSIS — E118 Type 2 diabetes mellitus with unspecified complications: Secondary | ICD-10-CM | POA: Insufficient documentation

## 2015-10-04 LAB — MICROALBUMIN / CREATININE URINE RATIO
Creatinine,U: 110.2 mg/dL
Microalb Creat Ratio: 0.6 mg/g (ref 0.0–30.0)

## 2015-10-04 LAB — TESTOSTERONE: Testosterone: 294.2 ng/dL — ABNORMAL LOW (ref 300.00–890.00)

## 2015-10-04 LAB — HEMOGLOBIN A1C: HEMOGLOBIN A1C: 5.8 % (ref 4.6–6.5)

## 2015-10-04 MED ORDER — SILDENAFIL CITRATE 100 MG PO TABS: 50.0000 mg | ORAL_TABLET | Freq: Every day | ORAL | Status: DC | PRN

## 2015-10-04 NOTE — Progress Notes (Signed)
Pre visit review using our clinic review tool, if applicable. No additional management support is needed unless otherwise documented below in the visit note. 

## 2015-10-04 NOTE — Patient Instructions (Signed)
Limit your sodium (Salt) intake  Please see your eye doctor yearly to check for diabetic eye damage    It is important that you exercise regularly, at least 20 minutes 3 to 4 times per week.  If you develop chest pain or shortness of breath seek  medical attention.  You need to lose weight.  Consider a lower calorie diet and regular exercise.  Return in 6 months for follow-up

## 2015-10-04 NOTE — Progress Notes (Signed)
Subjective:    Patient ID: Javier [name], male    DOB: Feb 06, 1951, 65 y.o.   MRN: [id number]  HPI  Lab Results  Component Value Date   HGBA1C 5.7 03/15/2015    BP Readings from Last 3 Encounters:  10/04/15 150/70  03/29/15 130/70  09/21/14 138/64    Wt Readings from Last 3 Encounters:  10/04/15 237 lb (107.502 kg)  03/29/15 230 lb (104.327 kg)  09/21/14 224 lb (101.59606 kg)    65 year old patient who is seen today in follow-up.  He has essential hypertension as well as type 2 diabetes. He has a history of ED and today complains of worsening ED as well as poor libido. There is been some steady weight gain. No eye examination last year  Past Medical History  Diagnosis Date  . BENIGN PROSTATIC HYPERTROPHY 02/20/2007  . ELEVATED PROSTATE SPECIFIC ANTIGEN 02/26/2009  . 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 FASCIITIS, PLANTAR 09/30/2007  . GERD 02/20/2007  . HYPERTENSION 02/20/2007  . HYPOKALEMIA 03/02/2009  . Obesity   . Diabetes mellitus   . Heart murmur   . Depression     Social History   Social History  . Marital Status: Married    Spouse Name: N/A  . Number of Children: N/A  . Years of Education: N/A   Occupational History  . Not on file.   Social History Main Topics  . Smoking status: Never Smoker   . Smokeless tobacco: Never Used  . Alcohol Use: No  . Drug Use: No  . Sexual Activity: Not on file   Other Topics Concern  . Not on file   Social History Narrative    Past Surgical History  Procedure Laterality Date  . Knee surgery      left    No family history on file.  No Known Allergies  Current Outpatient Prescriptions on File Prior to Visit  Medication Sig Dispense Refill  . amLODipine (NORVASC) 10 MG tablet Take 1 tablet (10 mg total) by mouth daily. 30 tablet 5  . metFORMIN (GLUCOPHAGE-XR) 500 MG 24 hr tablet take 1 tablet by mouth once daily WITH BREAKFAST 30 tablet 5  . nebivolol (BYSTOLIC) 10 MG tablet Take 1 tablet (10 mg total) by  mouth 2 (two) times daily. 60 tablet 5  . Olmesartan-Amlodipine-HCTZ (TRIBENZOR) 40-10-25 MG TABS take 1 tablet by mouth once daily 30 tablet 5  . potassium chloride SA (K-DUR,KLOR-CON) 20 MEQ tablet Take 1 tablet (20 mEq total) by mouth daily. 30 tablet 5  . tadalafil (CIALIS) 20 MG tablet Take 1 tablet (20 mg total) by mouth daily. as directed 10 tablet 2   No current facility-administered medications on file prior to visit.    BP 150/70 mmHg  Pulse 54  Temp(Src) 98.7 F (37.1 C) (Oral)  Resp 20  Ht 5' 6.75" (1.695 m)  Wt 237 lb (107.502 kg)  BMI 37.42 kg/m2  SpO2 98%     Review of Systems  Constitutional: Negative for fever, chills, appetite change and fatigue.  HENT: Negative for congestion, dental problem, ear pain, hearing loss, sore throat, tinnitus, trouble swallowing and voice change.   Eyes: Negative for pain, discharge and visual disturbance.  Respiratory: Negative for cough, chest tightness, wheezing and stridor.   Cardiovascular: Negative for chest pain, palpitations and leg swelling.  Gastrointestinal: Negative for nausea, vomiting, abdominal pain, diarrhea, constipation, blood in stool and abdominal distention.  Genitourinary: Negative for urgency, hematuria, flank pain, discharge, difficulty urinating and genital sores.  ED with poor libido  Musculoskeletal: Negative for myalgias, back pain, joint swelling, arthralgias, gait problem and neck stiffness.  Skin: Negative for rash.  Neurological: Negative for dizziness, syncope, speech difficulty, weakness, numbness and headaches.  Hematological: Negative for adenopathy. Does not bruise/bleed easily.  Psychiatric/Behavioral: Negative for behavioral problems and dysphoric mood. The patient is not nervous/anxious.        Objective:   Physical Exam  Constitutional: He is oriented to person, place, and time. He appears well-developed.  Blood pressure 140/64  HENT:  Head: Normocephalic.  Right Ear: External ear  normal.  Left Ear: External ear normal.  Pharyngeal crowding  Eyes: Conjunctivae and EOM are normal.  Neck: Normal range of motion.  Cardiovascular: Normal rate and normal heart sounds.   Pulmonary/Chest: Breath sounds normal.  Abdominal: Bowel sounds are normal.  Musculoskeletal: Normal range of motion. He exhibits no edema or tenderness.  Neurological: He is alert and oriented to person, place, and time.  Psychiatric: He has a normal mood and affect. His behavior is normal.          Assessment & Plan:   Diabetes mellitus.  Will check a hemoglobin A1c and urine for microalbumin.  Diabetic foot examination performed.  Annual eye examination recommended Essential hypertension, stable ED with poor libido.  Will check a testosterone level  Medications updated  Return in 6 months for follow-up

## 2015-10-05 ENCOUNTER — Other Ambulatory Visit: Payer: Self-pay

## 2015-10-05 MED ORDER — NEBIVOLOL HCL 10 MG PO TABS: 10.0000 mg | ORAL_TABLET | Freq: Two times a day (BID) | ORAL | Status: DC

## 2015-11-12 ENCOUNTER — Other Ambulatory Visit: Payer: Self-pay | Admitting: Internal Medicine

## 2015-11-15 ENCOUNTER — Telehealth: Payer: Self-pay | Admitting: Internal Medicine

## 2015-11-15 NOTE — Telephone Encounter (Signed)
Pt call to say that his insurance the only med they will approve for testerone is DANAZOL Pt said they will be faxing some information over to Dr Kirtland Bouchard for approval

## 2015-11-15 NOTE — Telephone Encounter (Signed)
Please see message and advise 

## 2015-11-15 NOTE — Telephone Encounter (Signed)
Danazol is not an appropriate or FDA approved medication for treatment of testosterone deficiency

## 2015-11-17 ENCOUNTER — Other Ambulatory Visit: Payer: Self-pay | Admitting: Internal Medicine

## 2015-11-17 DIAGNOSIS — N529 Male erectile dysfunction, unspecified: Secondary | ICD-10-CM

## 2015-11-17 NOTE — Telephone Encounter (Signed)
Spoke to pt, told him Danazol is not an appropriate or FDA approved medication for treatment of testosterone deficiency per Dr.K. Need to contact insurance and find out what other medications if any are covered. Gave pt a list of ones to ask for. Pt verbalized understanding.

## 2015-11-17 NOTE — Telephone Encounter (Signed)
Pt called his insurance [employer] and they will pay for the following:  Axiron, andro gel, testosterone inj  The andro gel is the most expensive for him. $40 copay The other two are $25.00  Whichever Dr Kirtland Bouchard decides.  [facility]/[address]

## 2015-11-18 NOTE — Telephone Encounter (Signed)
Please see message and advise 

## 2015-11-19 NOTE — Telephone Encounter (Signed)
Left message on machine for patient to return our call 

## 2015-11-19 NOTE — Telephone Encounter (Signed)
Noted Suggest repeating a early-morning total testosterone and free testosterone before initiating therapy

## 2015-11-22 ENCOUNTER — Other Ambulatory Visit: Payer: Self-pay | Admitting: Internal Medicine

## 2015-11-22 NOTE — Telephone Encounter (Signed)
Spoke to pt, told him Dr. Kirtland BouchardK would like to do another Testosterone test on you before starting therapy. Told pt just need lab appt only and do first thing in the morning. Pt verbalized understanding and transferred to scheduling. Order put in EPIC.

## 2015-11-29 ENCOUNTER — Other Ambulatory Visit (INDEPENDENT_AMBULATORY_CARE_PROVIDER_SITE_OTHER): Payer: BLUE CROSS/BLUE SHIELD

## 2015-11-29 DIAGNOSIS — N529 Male erectile dysfunction, unspecified: Secondary | ICD-10-CM | POA: Diagnosis not present

## 2015-12-12 ENCOUNTER — Other Ambulatory Visit: Payer: Self-pay | Admitting: Internal Medicine

## 2016-02-28 ENCOUNTER — Encounter: Payer: Self-pay | Admitting: Internal Medicine

## 2016-02-28 ENCOUNTER — Ambulatory Visit (INDEPENDENT_AMBULATORY_CARE_PROVIDER_SITE_OTHER): Payer: BLUE CROSS/BLUE SHIELD | Admitting: Internal Medicine

## 2016-02-28 ENCOUNTER — Other Ambulatory Visit: Payer: Self-pay

## 2016-02-28 VITALS — BP 144/60 | HR 60 | Temp 98.5°F | Ht 66.75 in | Wt 235.0 lb

## 2016-02-28 DIAGNOSIS — I10 Essential (primary) hypertension: Secondary | ICD-10-CM | POA: Diagnosis not present

## 2016-02-28 DIAGNOSIS — Z0000 Encounter for general adult medical examination without abnormal findings: Secondary | ICD-10-CM

## 2016-02-28 DIAGNOSIS — E119 Type 2 diabetes mellitus without complications: Secondary | ICD-10-CM

## 2016-02-28 LAB — HEMOGLOBIN A1C: Hgb A1c MFr Bld: 5.8 % (ref 4.6–6.5)

## 2016-02-28 MED ORDER — POTASSIUM CHLORIDE CRYS ER 20 MEQ PO TBCR: 20.0000 meq | EXTENDED_RELEASE_TABLET | Freq: Every day | ORAL | Status: DC

## 2016-02-28 MED ORDER — SILDENAFIL CITRATE 100 MG PO TABS: 50.0000 mg | ORAL_TABLET | Freq: Every day | ORAL | Status: DC | PRN

## 2016-02-28 MED ORDER — NEBIVOLOL HCL 10 MG PO TABS: 10.0000 mg | ORAL_TABLET | Freq: Two times a day (BID) | ORAL | Status: DC

## 2016-02-28 MED ORDER — OLMESARTAN-AMLODIPINE-HCTZ 40-10-25 MG PO TABS: 1.0000 | ORAL_TABLET | Freq: Every day | ORAL | Status: DC

## 2016-02-28 NOTE — Progress Notes (Signed)
Pre visit review using our clinic review tool, if applicable. No additional management support is needed unless otherwise documented below in the visit note. 

## 2016-02-28 NOTE — Patient Instructions (Signed)
Limit your sodium (Salt) intake    It is important that you exercise regularly, at least 20 minutes 3 to 4 times per week.  If you develop chest pain or shortness of breath seek  medical attention.  You need to lose weight.  Consider a lower calorie diet and regular exercise.   Please check your hemoglobin A1c every 6 months  

## 2016-02-28 NOTE — Progress Notes (Signed)
   Subjective:    Patient ID: Leanora Coverufus A Bhagat, male    DOB: 08/13/1951, 65 y.o.   MRN: [id number]  HPI  Lab Results  Component Value Date   HGBA1C 5.8 10/04/2015    BP Readings from Last 3 Encounters:  02/28/16 144/60  10/04/15 150/70  03/29/15 51130/[id number]   65 year old patient who is seen today for follow-up of type 2 diabetes.  This has been well-controlled on metformin therapy only.  He has a history of hypertension.  In January total testosterone was slightly decreased.  Follow-up studies including free testosterone level normal. Generally doing well. Does monitor home blood pressure readings with nice results  Eye  examination.  March 2017 Review of Systems  Constitutional: Negative for fever, chills, appetite change and fatigue.  HENT: Negative for congestion, dental problem, ear pain, hearing loss, sore throat, tinnitus, trouble swallowing and voice change.   Eyes: Negative for pain, discharge and visual disturbance.  Respiratory: Negative for cough, chest tightness, wheezing and stridor.   Cardiovascular: Negative for chest pain, palpitations and leg swelling.  Gastrointestinal: Negative for nausea, vomiting, abdominal pain, diarrhea, constipation, blood in stool and abdominal distention.  Genitourinary: Negative for urgency, hematuria, flank pain, discharge, difficulty urinating and genital sores.  Musculoskeletal: Negative for myalgias, back pain, joint swelling, arthralgias, gait problem and neck stiffness.  Skin: Negative for rash.  Neurological: Negative for dizziness, syncope, speech difficulty, weakness, numbness and headaches.  Hematological: Negative for adenopathy. Does not bruise/bleed easily.  Psychiatric/Behavioral: Negative for behavioral problems and dysphoric mood. The patient is not nervous/anxious.        Objective:   Physical Exam  Constitutional: He is oriented to person, place, and time. He appears well-developed.  Blood pressure 130/64  HENT:  Head:  Normocephalic.  Right Ear: External ear normal.  Left Ear: External ear normal.  Eyes: Conjunctivae and EOM are normal.  Neck: Normal range of motion.  Cardiovascular: Normal rate and normal heart sounds.   Pulmonary/Chest: Breath sounds normal.  Abdominal: Bowel sounds are normal.  Musculoskeletal: Normal range of motion. He exhibits no edema or tenderness.  Neurological: He is alert and oriented to person, place, and time.  Psychiatric: He has a normal mood and affect. His behavior is normal.          Assessment & Plan:   .  Diabetes mellitus.  Will check a hemoglobin A1c , essential hypertension, stable .  Obesity.  Weight loss encouraged  .  CPX 6 months   Rogelia BogaKWIATKOWSKI,Tishawn Friedhoff FRANK, MD

## 2016-02-29 LAB — HEPATITIS C ANTIBODY: HCV AB: NEGATIVE

## 2016-03-07 ENCOUNTER — Telehealth: Payer: Self-pay | Admitting: *Deleted

## 2016-03-07 NOTE — Telephone Encounter (Signed)
Received prior authorization request from [facility] for Viagra 100mg .  PA completed through CoverMyMeds.  Key: [id number]

## 2016-05-15 ENCOUNTER — Encounter: Payer: Self-pay | Admitting: Internal Medicine

## 2016-05-15 ENCOUNTER — Ambulatory Visit (INDEPENDENT_AMBULATORY_CARE_PROVIDER_SITE_OTHER): Payer: BLUE CROSS/BLUE SHIELD | Admitting: Internal Medicine

## 2016-05-15 VITALS — BP 144/60 | HR 68 | Temp 98.3°F | Resp 20 | Ht 66.75 in | Wt 236.5 lb

## 2016-05-15 DIAGNOSIS — I10 Essential (primary) hypertension: Secondary | ICD-10-CM | POA: Diagnosis not present

## 2016-05-15 DIAGNOSIS — F411 Generalized anxiety disorder: Secondary | ICD-10-CM

## 2016-05-15 DIAGNOSIS — E119 Type 2 diabetes mellitus without complications: Secondary | ICD-10-CM | POA: Diagnosis not present

## 2016-05-15 NOTE — Progress Notes (Signed)
Pre visit review using our clinic review tool, if applicable. No additional management support is needed unless otherwise documented below in the visit note. 

## 2016-05-15 NOTE — Progress Notes (Signed)
Subjective:    Patient ID: Javier [name], male    DOB: April 08, 1951, 65 y.o.   MRN: [id number]  HPI  65 year old patient who has essential hypertension, anxiety disorder and type 2 diabetes, control with metformin therapy.  Patient had a normal breakfast around 9 AM today and at approximately 12:30 experience some shakiness of his hands.  This lasted intermittently over 30 minutes.  Denies any other associated symptoms.  No prior history other than shaking following anesthesia in the past. His anxiety disorder has been stable.  He took a blood sugar which was 101 at the time of his shaking episode  There has been no excessive caffeine or decongestant use  Past Medical History:  Diagnosis Date  . BENIGN PROSTATIC HYPERTROPHY 02/20/2007  . Depression   . Diabetes mellitus   . ELEVATED PROSTATE SPECIFIC ANTIGEN 02/26/2009  . 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 FASCIITIS, PLANTAR 09/30/2007  . GERD 02/20/2007  . Heart murmur   . HYPERTENSION 02/20/2007  . HYPOKALEMIA 03/02/2009  . Obesity      Social History   Social History  . Marital status: Married    Spouse name: N/A  . Number of children: N/A  . Years of education: N/A   Occupational History  . Not on file.   Social History Main Topics  . Smoking status: Never Smoker  . Smokeless tobacco: Never Used  . Alcohol use No  . Drug use: No  . Sexual activity: Not on file   Other Topics Concern  . Not on file   Social History Narrative  . No narrative on file    Past Surgical History:  Procedure Laterality Date  . KNEE SURGERY     left    No family history on file.  No Known Allergies  Current Outpatient Prescriptions on File Prior to Visit  Medication Sig Dispense Refill  . amLODipine (NORVASC) 10 MG tablet take 1 tablet by mouth once daily 30 tablet 5  . metFORMIN (GLUCOPHAGE-XR) 500 MG 24 hr tablet TAKE 1 TABLET BY MOUTH ONCE DAILY WITH BREAKFAST 30 tablet 5  . nebivolol (BYSTOLIC) 10 MG tablet Take 1  tablet (10 mg total) by mouth 2 (two) times daily. 60 tablet 5  . Olmesartan-Amlodipine-HCTZ 40-10-25 MG TABS Take 1 tablet by mouth daily. 30 tablet 5  . potassium chloride SA (K-DUR,KLOR-CON) 20 MEQ tablet Take 1 tablet (20 mEq total) by mouth daily. 30 tablet 5  . sildenafil (VIAGRA) 100 MG tablet Take 0.5-1 tablets (50-100 mg total) by mouth daily as needed for erectile dysfunction. 5 tablet 11  . tadalafil (CIALIS) 20 MG tablet Take 1 tablet (20 mg total) by mouth daily. as directed 10 tablet 2   No current facility-administered medications on file prior to visit.     BP (!) 144/60 (BP Location: Left Arm, Patient Position: Sitting, Cuff Size: Large)   Pulse 68   Temp 98.3 F (36.8 C) (Oral)   Resp 20   Ht 5' 6.75" (1.695 m)   Wt 236 lb 8 oz (107.3 kg)   SpO2 97%   BMI 37.32 kg/m     Review of Systems  Constitutional: Negative for appetite change, chills, fatigue and fever.  HENT: Negative for congestion, dental problem, ear pain, hearing loss, sore throat, tinnitus, trouble swallowing and voice change.   Eyes: Negative for pain, discharge and visual disturbance.  Respiratory: Negative for cough, chest tightness, wheezing and stridor.   Cardiovascular: Negative for chest pain, palpitations and leg swelling.  Gastrointestinal: Negative for abdominal distention, abdominal pain, blood in stool, constipation, diarrhea, nausea and vomiting.  Genitourinary: Negative for difficulty urinating, discharge, flank pain, genital sores, hematuria and urgency.  Musculoskeletal: Negative for arthralgias, back pain, gait problem, joint swelling, myalgias and neck stiffness.  Skin: Negative for rash.  Neurological: Positive for tremors. Negative for dizziness, syncope, speech difficulty, weakness, numbness and headaches.  Hematological: Negative for adenopathy. Does not bruise/bleed easily.  Psychiatric/Behavioral: Negative for behavioral problems and dysphoric mood. The patient is not  nervous/anxious.        Objective:   Physical Exam  Constitutional: He is oriented to person, place, and time. He appears well-developed and well-nourished.  HENT:  Head: Normocephalic.  Right Ear: External ear normal.  Left Ear: External ear normal.  Eyes: Conjunctivae and EOM are normal.  Neck: Normal range of motion.  Cardiovascular: Normal rate and normal heart sounds.   Pulmonary/Chest: Breath sounds normal.  Abdominal: Bowel sounds are normal.  Musculoskeletal: Normal range of motion. He exhibits no edema or tenderness.  Neurological: He is alert and oriented to person, place, and time. He has normal reflexes.  No tremor of the outstretched hands  Psychiatric: He has a normal mood and affect. His behavior is normal.          Assessment & Plan:   Nonspecific tremor.  We'll clinically observe at this time Diabetes mellitus.  Tight control Essential hypertension, stable.  Blood pressure repeat 140 over 60  Schedule CPX with updated lab including thyroid function studies Patient report any recurrent or new symptoms  Rogelia BogaKWIATKOWSKI,Shervon Kerwin FRANK, MD

## 2016-05-15 NOTE — Patient Instructions (Signed)
Limit your sodium (Salt) intake  Please check your blood pressure on a regular basis.  If it is consistently greater than 150/90, please make an office appointment.  Call or return to clinic prn if these symptoms worsen or fail to improve as anticipated.  Annual exam as scheduled

## 2016-05-24 ENCOUNTER — Other Ambulatory Visit: Payer: Self-pay | Admitting: Internal Medicine

## 2016-06-12 ENCOUNTER — Other Ambulatory Visit: Payer: Self-pay | Admitting: Internal Medicine

## 2016-08-28 ENCOUNTER — Other Ambulatory Visit (INDEPENDENT_AMBULATORY_CARE_PROVIDER_SITE_OTHER): Payer: BLUE CROSS/BLUE SHIELD

## 2016-08-28 DIAGNOSIS — Z0000 Encounter for general adult medical examination without abnormal findings: Secondary | ICD-10-CM | POA: Diagnosis not present

## 2016-08-28 LAB — CBC WITH DIFFERENTIAL/PLATELET
BASOS ABS: 0 10*3/uL (ref 0.0–0.1)
Basophils Relative: 0.6 % (ref 0.0–3.0)
EOS PCT: 4 % (ref 0.0–5.0)
Eosinophils Absolute: 0.2 10*3/uL (ref 0.0–0.7)
HCT: 42.7 % (ref 39.0–52.0)
Hemoglobin: 14.6 g/dL (ref 13.0–17.0)
LYMPHS ABS: 1.9 10*3/uL (ref 0.7–4.0)
Lymphocytes Relative: 41.6 % (ref 12.0–46.0)
MCHC: 34.1 g/dL (ref 30.0–36.0)
MCV: 84.1 fl (ref 78.0–100.0)
MONO ABS: 0.6 10*3/uL (ref 0.1–1.0)
MONOS PCT: 12.5 % — AB (ref 3.0–12.0)
NEUTROS ABS: 1.9 10*3/uL (ref 1.4–7.7)
NEUTROS PCT: 41.3 % — AB (ref 43.0–77.0)
PLATELETS: 239 10*3/uL (ref 150.0–400.0)
RBC: 5.07 Mil/uL (ref 4.22–5.81)
RDW: 14.7 % (ref 11.5–15.5)
WBC: 4.6 10*3/uL (ref 4.0–10.5)

## 2016-08-28 LAB — BASIC METABOLIC PANEL
BUN: 15 mg/dL (ref 6–23)
CALCIUM: 8.8 mg/dL (ref 8.4–10.5)
CO2: 29 meq/L (ref 19–32)
Chloride: 102 mEq/L (ref 96–112)
Creatinine, Ser: 0.77 mg/dL (ref 0.40–1.50)
GFR: 130.11 mL/min (ref >60.00)
GLUCOSE: 106 mg/dL — AB (ref 70–99)
Potassium: 3.8 mEq/L (ref 3.5–5.1)
SODIUM: 139 meq/L (ref 135–145)

## 2016-08-28 LAB — POC URINALSYSI DIPSTICK (AUTOMATED)
Bilirubin, UA: NEGATIVE
Blood, UA: NEGATIVE
Glucose, UA: NEGATIVE
Ketones, UA: NEGATIVE
Leukocytes, UA: NEGATIVE
Nitrite, UA: NEGATIVE
PH UA: 7
SPEC GRAV UA: 1.015
UROBILINOGEN UA: 0.2

## 2016-08-28 LAB — LIPID PANEL
CHOLESTEROL: 165 mg/dL (ref 0–200)
HDL: 54.3 mg/dL (ref >39.00)
LDL Cholesterol: 102 mg/dL — ABNORMAL HIGH (ref 0–99)
NonHDL: 110.23
Total CHOL/HDL Ratio: 3
Triglycerides: 39 mg/dL (ref 0.0–149.0)
VLDL: 7.8 mg/dL (ref 0.0–40.0)

## 2016-08-28 LAB — HEPATIC FUNCTION PANEL
ALBUMIN: 3.8 g/dL (ref 3.5–5.2)
ALT: 23 U/L (ref 0–53)
AST: 22 U/L (ref 0–37)
Alkaline Phosphatase: 62 U/L (ref 39–117)
BILIRUBIN TOTAL: 0.6 mg/dL (ref 0.2–1.2)
Bilirubin, Direct: 0.1 mg/dL (ref 0.0–0.3)
Total Protein: 7.2 g/dL (ref 6.0–8.3)

## 2016-08-28 LAB — PSA: PSA: 4.39 ng/mL — AB (ref 0.10–4.00)

## 2016-08-28 LAB — MICROALBUMIN / CREATININE URINE RATIO
Creatinine,U: 199.8 mg/dL
MICROALB/CREAT RATIO: 0.6 mg/g (ref 0.0–30.0)
Microalb, Ur: 1.2 mg/dL (ref 0.0–1.9)

## 2016-08-28 LAB — TSH: TSH: 2.55 u[IU]/mL (ref 0.35–4.50)

## 2016-08-28 LAB — HEMOGLOBIN A1C: HEMOGLOBIN A1C: 5.8 % (ref 4.6–6.5)

## 2016-08-30 ENCOUNTER — Other Ambulatory Visit: Payer: Self-pay | Admitting: Internal Medicine

## 2016-09-01 ENCOUNTER — Encounter (HOSPITAL_COMMUNITY): Payer: Self-pay

## 2016-09-01 ENCOUNTER — Emergency Department (HOSPITAL_COMMUNITY)
Admission: EM | Admit: 2016-09-01 | Discharge: 2016-09-01 | Disposition: A | Discharge status: Home / Self Care | Payer: BLUE CROSS/BLUE SHIELD | Attending: Emergency Medicine | Admitting: Emergency Medicine

## 2016-09-01 DIAGNOSIS — R42 Dizziness and giddiness: Secondary | ICD-10-CM

## 2016-09-01 DIAGNOSIS — I10 Essential (primary) hypertension: Secondary | ICD-10-CM | POA: Diagnosis not present

## 2016-09-01 DIAGNOSIS — Z7984 Long term (current) use of oral hypoglycemic drugs: Secondary | ICD-10-CM | POA: Diagnosis not present

## 2016-09-01 DIAGNOSIS — E1165 Type 2 diabetes mellitus with hyperglycemia: Secondary | ICD-10-CM | POA: Insufficient documentation

## 2016-09-01 DIAGNOSIS — R739 Hyperglycemia, unspecified: Secondary | ICD-10-CM

## 2016-09-01 LAB — BASIC METABOLIC PANEL
Anion gap: 9 (ref 5–15)
BUN: 16 mg/dL (ref 6–20)
CHLORIDE: 100 mmol/L — AB (ref 101–111)
CO2: 27 mmol/L (ref 22–32)
CREATININE: 0.78 mg/dL (ref 0.61–1.24)
Calcium: 9.1 mg/dL (ref 8.9–10.3)
GFR calc Af Amer: >60 mL/min (ref >60)
GFR calc non Af Amer: >60 mL/min (ref >60)
Glucose, Bld: 181 mg/dL — ABNORMAL HIGH (ref 65–99)
Potassium: 3.5 mmol/L (ref 3.5–5.1)
SODIUM: 136 mmol/L (ref 135–145)

## 2016-09-01 LAB — URINALYSIS, ROUTINE W REFLEX MICROSCOPIC
Bilirubin Urine: NEGATIVE
GLUCOSE, UA: NEGATIVE mg/dL
HGB URINE DIPSTICK: NEGATIVE
KETONES UR: NEGATIVE mg/dL
Leukocytes, UA: NEGATIVE
Nitrite: NEGATIVE
PROTEIN: NEGATIVE mg/dL
Specific Gravity, Urine: 1.013 (ref 1.005–1.030)
pH: 6 (ref 5.0–8.0)

## 2016-09-01 LAB — CBC
HCT: 44 % (ref 39.0–52.0)
Hemoglobin: 14.8 g/dL (ref 13.0–17.0)
MCH: 28.7 pg (ref 26.0–34.0)
MCHC: 33.6 g/dL (ref 30.0–36.0)
MCV: 85.4 fL (ref 78.0–100.0)
PLATELETS: 226 10*3/uL (ref 150–400)
RBC: 5.15 MIL/uL (ref 4.22–5.81)
RDW: 14.6 % (ref 11.5–15.5)
WBC: 4.6 10*3/uL (ref 4.0–10.5)

## 2016-09-01 LAB — TROPONIN I: Troponin I: <0.03 ng/mL (ref <0.03)

## 2016-09-01 LAB — CBG MONITORING, ED: Glucose-Capillary: 176 mg/dL — ABNORMAL HIGH (ref 65–99)

## 2016-09-01 NOTE — Discharge Instructions (Signed)
It was our pleasure to provide your ER care today - we hope that you feel better.  The diuretic medication you are taking, can make you urinate a lot.   Drink plenty of fluids.   Follow up with your doctor in the next few days for recheck - also have your blood pressure rechecked then, and discuss your medications with your doctor.  Return to ER if worse, new symptoms, chest pain, trouble breathing, fainting, fevers, other concern.

## 2016-09-01 NOTE — Discharge Planning (Signed)
Pt up for discharge. EDCM reviewed chart for possible CM needs.  No needs identified or communicated.  

## 2016-09-01 NOTE — ED Provider Notes (Signed)
[facility]-EMERGENCY DEPT Provider Note   CSN: [id number] Arrival date & time: 09/01/16  [id number]     History   Chief Complaint Chief Complaint  Patient presents with  . Dizziness  . Shaking    HPI Javier [name] is a 65 y.o. male.  Patient c/o onset feeling lightheaded this AM. Symptoms moderate, persistent. Felt fine/normal last night, and got up this AM for work and felt lightheaded. No syncope. Denies room spinning.   No chest pain or discomfort. No palpitations or sense of fast or irregular heart beat.  No sob. No headache. No cough or uri symptoms. No abd pain, no vomiting or diarrhea. No fever ore chills.  No recent change in meds or new meds.  No recent blood loss, no melena or hematochezia.    The history is provided by the patient.    Past Medical History:  Diagnosis Date  . BENIGN PROSTATIC HYPERTROPHY 02/20/2007  . Depression   . Diabetes mellitus   . ELEVATED PROSTATE SPECIFIC ANTIGEN 02/26/2009  . 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 FASCIITIS, PLANTAR 09/30/2007  . GERD 02/20/2007  . Heart murmur   . HYPERTENSION 02/20/2007  . HYPOKALEMIA 03/02/2009  . Obesity     Patient Active Problem List   Diagnosis Date Noted  . Diabetes mellitus without complication (HCC) 10/04/2015  . Adverse effect of benzodiazepine 10/15/2011  . Anxiety disorder 06/02/2011  . 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 HYPOKALEMIA 03/02/2009  . ELEVATED PROSTATE SPECIFIC ANTIGEN 02/26/2009  . FASCIITIS, PLANTAR 09/30/2007  . Essential hypertension 02/20/2007  . GERD 02/20/2007  . BENIGN PROSTATIC HYPERTROPHY 02/20/2007    Past Surgical History:  Procedure Laterality Date  . KNEE SURGERY     left       Home Medications    Prior to Admission medications   Medication Sig Start Date End Date Taking? Authorizing Provider  amLODipine (NORVASC) 10 MG tablet take 1 tablet by mouth once daily 06/13/16   Gordy SaversPeter F Kwiatkowski, MD  BYSTOLIC 10 MG tablet take 1 tablet by mouth twice a  day 08/30/16   Gordy SaversPeter F Kwiatkowski, MD  metFORMIN (GLUCOPHAGE-XR) 500 MG 24 hr tablet take 1 tablet by mouth once daily WITH BREAKFAST 05/24/16   Gordy SaversPeter F Kwiatkowski, MD  Olmesartan-Amlodipine-HCTZ 40-10-25 MG TABS Take 1 tablet by mouth daily. 02/28/16   Gordy SaversPeter F Kwiatkowski, MD  potassium chloride SA (K-DUR,KLOR-CON) 20 MEQ tablet Take 1 tablet (20 mEq total) by mouth daily. 02/28/16   Gordy SaversPeter F Kwiatkowski, MD  sildenafil (VIAGRA) 100 MG tablet Take 0.5-1 tablets (50-100 mg total) by mouth daily as needed for erectile dysfunction. 02/28/16   Gordy SaversPeter F Kwiatkowski, MD  tadalafil (CIALIS) 20 MG tablet Take 1 tablet (20 mg total) by mouth daily. as directed 03/29/15   Gordy SaversPeter F Kwiatkowski, MD    Family History No family history on file.  Social History Social History  Substance Use Topics  . Smoking status: Never Smoker  . Smokeless tobacco: Never Used  . Alcohol use No     Allergies   Patient has no known allergies.   Review of Systems Review of Systems  Constitutional: Negative for fever.  HENT: Negative for sore throat.   Eyes: Negative for visual disturbance.  Respiratory: Negative for cough and shortness of breath.   Cardiovascular: Negative for chest pain and palpitations.  Gastrointestinal: Negative for abdominal pain.  Genitourinary: Negative for flank pain.  Musculoskeletal: Negative for back pain and neck pain.  Skin: Negative for rash.  Neurological: Positive  for light-headedness. Negative for weakness, numbness and headaches.  Hematological: Does not bruise/bleed easily.  Psychiatric/Behavioral: Negative for confusion.     Physical Exam Updated Vital Signs BP 165/71 (BP Location: Right Arm)   Pulse 71   Temp 98.2 F (36.8 C) (Oral)   Resp 16   Ht 5' 7.5" (1.715 m)   Wt 104.3 kg   SpO2 98%   BMI 35.49 kg/m   Physical Exam  Constitutional: He is oriented to person, place, and time. He appears well-developed and well-nourished. No distress.  HENT:  Mouth/Throat:  Oropharynx is clear and moist.  Eyes: Conjunctivae are normal. Pupils are equal, round, and reactive to light.  Neck: Neck supple. No tracheal deviation present.  No bruits.   Cardiovascular: Normal rate, regular rhythm, normal heart sounds and intact distal pulses.   Pulmonary/Chest: Effort normal and breath sounds normal. No accessory muscle usage. No respiratory distress.  Abdominal: Soft. Bowel sounds are normal. He exhibits no distension. There is no tenderness.  Musculoskeletal: He exhibits no edema.  Neurological: He is alert and oriented to person, place, and time.  Speech clear/fluent. Ambulates w steady gait.   Skin: Skin is warm and dry. He is not diaphoretic.  Psychiatric: He has a normal mood and affect.  Nursing note and vitals reviewed.    ED Treatments / Results  Labs (all labs ordered are listed, but only abnormal results are displayed) Results for orders placed or performed during the hospital encounter of 09/01/16  Basic metabolic panel  Result Value Ref Range   Sodium 136 135 - 145 mmol/L   Potassium 3.5 3.5 - 5.1 mmol/L   Chloride 100 (L) 101 - 111 mmol/L   CO2 27 22 - 32 mmol/L   Glucose, Bld 181 (H) 65 - 99 mg/dL   BUN 16 6 - 20 mg/dL   Creatinine, Ser 1.61 0.61 - 1.24 mg/dL   Calcium 9.1 8.9 - 09.6 mg/dL   GFR calc non Af Amer >60 >60 mL/min   GFR calc Af Amer >60 >60 mL/min   Anion gap 9 5 - 15  CBC  Result Value Ref Range   WBC 4.6 4.0 - 10.5 K/uL   RBC 5.15 4.22 - 5.81 MIL/uL   Hemoglobin 14.8 13.0 - 17.0 g/dL   HCT 04.5 40.9 - 81.1 %   MCV 85.4 78.0 - 100.0 fL   MCH 28.7 26.0 - 34.0 pg   MCHC 33.6 30.0 - 36.0 g/dL   RDW 91.4 78.2 - 95.6 %   Platelets 226 150 - 400 K/uL  Urinalysis, Routine w reflex microscopic  Result Value Ref Range   Color, Urine YELLOW YELLOW   APPearance CLEAR CLEAR   Specific Gravity, Urine 1.013 1.005 - 1.030   pH 6.0 5.0 - 8.0   Glucose, UA NEGATIVE NEGATIVE mg/dL   Hgb urine dipstick NEGATIVE NEGATIVE   Bilirubin  Urine NEGATIVE NEGATIVE   Ketones, ur NEGATIVE NEGATIVE mg/dL   Protein, ur NEGATIVE NEGATIVE mg/dL   Nitrite NEGATIVE NEGATIVE   Leukocytes, UA NEGATIVE NEGATIVE  Troponin I  Result Value Ref Range   Troponin I <0.03 <0.03 ng/mL  CBG monitoring, ED  Result Value Ref Range   Glucose-Capillary 176 (H) 65 - 99 mg/dL   No results found.  EKG  EKG Interpretation  Date/Time:  Friday September 01 2016 07:55:55 EST Ventricular Rate:  70 PR Interval:    QRS Duration: 96 QT Interval:  403 QTC Calculation: 435 R Axis:   35 Text Interpretation:  Sinus rhythm Prolonged PR interval Confirmed by Denton LankSTEINL  MD, Caryn BeeKEVIN ([id number]) on 09/01/2016 7:57:53 AM       Radiology No results found.  Procedures Procedures (including critical care time)  Medications Ordered in ED Medications - No data to display   Initial Impression / Assessment and Plan / ED Course  I have reviewed the triage vital signs and the nursing notes.  Pertinent labs & imaging results that were available during my care of the patient were reviewed by me and considered in my medical decision making (see chart for details).  Clinical Course     Iv ns. Monitor. Labs. Ecg.   Reviewed nursing notes and prior charts for additional history.   Recheck, pt in sinus rhythm. Up to bathroom, no faintness or dizziness.   No fever or chills.   Denies any chest pain or discomfort.  Patient currently appears stable for d/c.     Final Clinical Impressions(s) / ED Diagnoses   Final diagnoses:  None    New Prescriptions New Prescriptions   No medications on file     Cathren LaineKevin Lorita Forinash, MD 09/01/16 [phone]

## 2016-09-01 NOTE — ED Triage Notes (Signed)
Pt. Woke up with dizziness, lightheaded and feeling shaky.  He is Type 2 diabetic, CBG was 105 this morning.  The symptoms lasted a few minutes.  Pt. Felt good yesterday.  Pt. Denies any pain or discomfort.  Symptoms have resolved.  Pt. Did eat oatmeal and drove himself here.  Skin is warm , pink and dry,  Pt. Denies any chest pain or sob,  Alert and oriented X4

## 2016-09-04 ENCOUNTER — Ambulatory Visit (INDEPENDENT_AMBULATORY_CARE_PROVIDER_SITE_OTHER): Payer: BLUE CROSS/BLUE SHIELD | Admitting: Internal Medicine

## 2016-09-04 ENCOUNTER — Encounter: Payer: Self-pay | Admitting: Internal Medicine

## 2016-09-04 VITALS — BP 156/70 | HR 72 | Temp 97.9°F | Ht 67.25 in | Wt 240.2 lb

## 2016-09-04 DIAGNOSIS — Z0000 Encounter for general adult medical examination without abnormal findings: Secondary | ICD-10-CM

## 2016-09-04 NOTE — Progress Notes (Signed)
Pre visit review using our clinic review tool, if applicable. No additional management support is needed unless otherwise documented below in the visit note. 

## 2016-09-04 NOTE — Progress Notes (Signed)
Subjective:    Patient ID: Javier [name], male    DOB: [birth year], 65 y.o.   MRN: [id number]  HPI  65 year old patient who is seen today for a preventive health examination. He has multi-drug-resistant hypertension and type 2 diabetes. He was seen in the ED last week the afternoon episode of dizziness. In general doing well.  No recurrent symptoms over the weekend  Laboratory studies reviewed  Last colonoscopy 2006  Lab Results  Component Value Date   HGBA1C 5.8 08/28/2016   The patient's PSA was slightly elevated.  It was elevated.  Briefly 65 years ago.  He underwent prostate biopsy at that time  Past Medical History:  Diagnosis Date  . BENIGN PROSTATIC HYPERTROPHY 02/20/2007  . Depression   . Diabetes mellitus   . ELEVATED PROSTATE SPECIFIC ANTIGEN 02/26/2009  . 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 FASCIITIS, PLANTAR 09/30/2007  . GERD 02/20/2007  . Heart murmur   . HYPERTENSION 02/20/2007  . HYPOKALEMIA 03/02/2009  . Obesity      Social History   Social History  . Marital status: Married    Spouse name: N/A  . Number of children: N/A  . Years of education: N/A   Occupational History  . Not on file.   Social History Main Topics  . Smoking status: Never Smoker  . Smokeless tobacco: Never Used  . Alcohol use No  . Drug use: No  . Sexual activity: Not on file   Other Topics Concern  . Not on file   Social History Narrative  . No narrative on file    Past Surgical History:  Procedure Laterality Date  . KNEE SURGERY     left    No family history on file.  No Known Allergies  Current Outpatient Prescriptions on File Prior to Visit  Medication Sig Dispense Refill  . amLODipine (NORVASC) 10 MG tablet take 1 tablet by mouth once daily 30 tablet 3  . BYSTOLIC 10 MG tablet take 1 tablet by mouth twice a day 60 tablet 5  . metFORMIN (GLUCOPHAGE-XR) 500 MG 24 hr tablet take 1 tablet by mouth once daily WITH BREAKFAST 30 tablet 5  .  Olmesartan-Amlodipine-HCTZ 40-10-25 MG TABS Take 1 tablet by mouth daily. 30 tablet 5  . potassium chloride SA (K-DUR,KLOR-CON) 20 MEQ tablet Take 1 tablet (20 mEq total) by mouth daily. 30 tablet 5  . sildenafil (VIAGRA) 100 MG tablet Take 0.5-1 tablets (50-100 mg total) by mouth daily as needed for erectile dysfunction. 5 tablet 11  . tadalafil (CIALIS) 20 MG tablet Take 1 tablet (20 mg total) by mouth daily. as directed 10 tablet 2   No current facility-administered medications on file prior to visit.     BP (!) 156/70 (BP Location: Right Arm, Patient Position: Sitting, Cuff Size: Large)   Pulse 72   Temp 97.9 F (36.6 C) (Oral)   Ht 5' 7.25" (1.708 m)   Wt 240 lb 4 oz (109 kg)   SpO2 97%   BMI 37.35 kg/m     Review of Systems     Objective:   Physical Exam  Constitutional: He appears well-developed and well-nourished.  Blood pressure 135/64  HENT:  Head: Normocephalic and atraumatic.  Right Ear: External ear normal.  Left Ear: External ear normal.  Nose: Nose normal.  Mouth/Throat: Oropharynx is clear and moist.  Eyes: Conjunctivae and EOM are normal. Pupils are equal, round, and reactive to light. No scleral icterus.  Neck: Normal range of motion.  Neck supple. No JVD present. No thyromegaly present.  Cardiovascular: Regular rhythm, normal heart sounds and intact distal pulses.  Exam reveals no gallop and no friction rub.   No murmur heard. Pulmonary/Chest: Effort normal and breath sounds normal. He exhibits no tenderness.  Abdominal: Soft. Bowel sounds are normal. He exhibits no distension and no mass. There is no tenderness.  Genitourinary: Prostate normal and penis normal. Rectal exam shows guaiac negative stool.  Genitourinary Comments: Prostate only minimally enlarged and benign  Musculoskeletal: Normal range of motion. He exhibits no edema or tenderness.  Lymphadenopathy:    He has no cervical adenopathy.  Neurological: He is alert. He has normal reflexes. No  cranial nerve deficit. Coordination normal.  Skin: Skin is warm and dry. No rash noted.  Psychiatric: He has a normal mood and affect. His behavior is normal.          Assessment & Plan:   Preventive health examination.  Will schedule follow-up colonoscopy Type 2 diabetes.  Hemoglobin A1c under excellent control.  No change in therapy Hypertension, stable no change in therapy .  May need to consider changes after the first the year due to formulary changes. Elevated PSA.  Will recheck in 4 months  KWIATKOWSKI,PETER [name]

## 2016-09-04 NOTE — Patient Instructions (Signed)
Limit your sodium (Salt) intake  Please check your blood pressure on a regular basis.  If it is consistently greater than 150/90, please make an office appointment.    It is important that you exercise regularly, at least 20 minutes 3 to 4 times per week.  If you develop chest pain or shortness of breath seek  medical attention.  You need to lose weight.  Consider a lower calorie diet and regular exercise.  Schedule your colonoscopy to help detect colon cancer.  Please see your eye doctor yearly to check for diabetic eye damage

## 2016-10-02 ENCOUNTER — Encounter: Payer: Self-pay | Admitting: Internal Medicine

## 2016-10-02 ENCOUNTER — Ambulatory Visit (INDEPENDENT_AMBULATORY_CARE_PROVIDER_SITE_OTHER): Payer: BLUE CROSS/BLUE SHIELD | Admitting: Internal Medicine

## 2016-10-02 VITALS — BP 148/72 | HR 60 | Temp 98.0°F | Ht 67.5 in | Wt 241.2 lb

## 2016-10-02 DIAGNOSIS — E119 Type 2 diabetes mellitus without complications: Secondary | ICD-10-CM

## 2016-10-02 DIAGNOSIS — L601 Onycholysis: Secondary | ICD-10-CM | POA: Diagnosis not present

## 2016-10-02 DIAGNOSIS — I10 Essential (primary) hypertension: Secondary | ICD-10-CM

## 2016-10-02 NOTE — Progress Notes (Signed)
Pre visit review using our clinic review tool, if applicable. No additional management support is needed unless otherwise documented below in the visit note. 

## 2016-10-02 NOTE — Progress Notes (Signed)
Subjective:    Patient ID: Javier [name], male    DOB: 06-Jul-1951, 66 y.o.   MRN: [id number]  HPI  66 year old patient who has diabetes and essential hypertension.  He presents today with some concerns about some bleeding about the right great toenail He also had episode where he became quite tremulous.  Blood sugar was checked and was over 100.  He also feels that at times he becomes a bit more forgetful.  Past Medical History:  Diagnosis Date  . BENIGN PROSTATIC HYPERTROPHY 02/20/2007  . Depression   . Diabetes mellitus   . ELEVATED PROSTATE SPECIFIC ANTIGEN 02/26/2009  . 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 FASCIITIS, PLANTAR 09/30/2007  . GERD 02/20/2007  . Heart murmur   . HYPERTENSION 02/20/2007  . HYPOKALEMIA 03/02/2009  . Obesity      Social History   Social History  . Marital status: Married    Spouse name: N/A  . Number of children: N/A  . Years of education: N/A   Occupational History  . Not on file.   Social History Main Topics  . Smoking status: Never Smoker  . Smokeless tobacco: Never Used  . Alcohol use No  . Drug use: No  . Sexual activity: Not on file   Other Topics Concern  . Not on file   Social History Narrative  . No narrative on file    Past Surgical History:  Procedure Laterality Date  . KNEE SURGERY     left    No family history on file.  No Known Allergies  Current Outpatient Prescriptions on File Prior to Visit  Medication Sig Dispense Refill  . BYSTOLIC 10 MG tablet take 1 tablet by mouth twice a day 60 tablet 5  . metFORMIN (GLUCOPHAGE-XR) 500 MG 24 hr tablet take 1 tablet by mouth once daily WITH BREAKFAST 30 tablet 5  . Olmesartan-Amlodipine-HCTZ 40-10-25 MG TABS Take 1 tablet by mouth daily. 30 tablet 5  . potassium chloride SA (K-DUR,KLOR-CON) 20 MEQ tablet Take 1 tablet (20 mEq total) by mouth daily. 30 tablet 5  . sildenafil (VIAGRA) 100 MG tablet Take 0.5-1 tablets (50-100 mg total) by mouth daily as needed for  erectile dysfunction. 5 tablet 11  . tadalafil (CIALIS) 20 MG tablet Take 1 tablet (20 mg total) by mouth daily. as directed 10 tablet 2   No current facility-administered medications on file prior to visit.     BP (!) 148/72 (BP Location: Left Arm, Patient Position: Sitting, Cuff Size: Large)   Pulse 60   Temp 98 F (36.7 C) (Oral)   Ht 5' 7.5" (1.715 m)   Wt 241 lb 3.2 oz (109.4 kg)   SpO2 98%   BMI 37.22 kg/m     Review of Systems  Constitutional: Negative for appetite change, chills, fatigue and fever.  HENT: Negative for congestion, dental problem, ear pain, hearing loss, sore throat, tinnitus, trouble swallowing and voice change.   Eyes: Negative for pain, discharge and visual disturbance.  Respiratory: Negative for cough, chest tightness, wheezing and stridor.   Cardiovascular: Negative for chest pain, palpitations and leg swelling.  Gastrointestinal: Negative for abdominal distention, abdominal pain, blood in stool, constipation, diarrhea, nausea and vomiting.  Genitourinary: Negative for difficulty urinating, discharge, flank pain, genital sores, hematuria and urgency.  Musculoskeletal: Negative for arthralgias, back pain, gait problem, joint swelling, myalgias and neck stiffness.  Skin: Positive for wound. Negative for rash.  Neurological: Positive for tremors. Negative for dizziness, syncope, speech difficulty, weakness,  numbness and headaches.  Hematological: Negative for adenopathy. Does not bruise/bleed easily.  Psychiatric/Behavioral: Positive for decreased concentration. Negative for behavioral problems and dysphoric mood. The patient is not nervous/anxious.        Objective:   Physical Exam  Constitutional: He appears well-developed and well-nourished. No distress.  Blood pressure 142/72  Cardiovascular: Normal rate and regular rhythm.   Skin:  Mild onycholysis of right great toenail.  Toenail was slightly loose and raised.  No signs of active infection    Psychiatric: He has a normal mood and affect. His behavior is normal. Judgment and thought content normal.          Assessment & Plan:   Hypertension.  Multi-drug-resistant.  No change in therapy Diabetes Right great toe onycholysis  Javier [name]

## 2016-10-02 NOTE — Patient Instructions (Signed)
Limit your sodium (Salt) intake  Please check your blood pressure on a regular basis.  If it is consistently greater than 150/90, please make an office appointment.   Please check your hemoglobin A1c every 3 months  

## 2016-10-04 ENCOUNTER — Other Ambulatory Visit: Payer: Self-pay | Admitting: Internal Medicine

## 2016-10-06 ENCOUNTER — Encounter: Payer: Self-pay | Admitting: Internal Medicine

## 2016-10-09 ENCOUNTER — Telehealth: Payer: Self-pay | Admitting: Internal Medicine

## 2016-10-09 NOTE — Telephone Encounter (Signed)
Pt would like to have this called in today and do not want to pay $100.00 for the bystolic he is totally out of his medication.  [facility] [address]

## 2016-10-09 NOTE — Telephone Encounter (Signed)
° ° °  Pt said the last time he was in he told Dr Kirtland BouchardK about some meds that may increase in price. Dr Kirtland BouchardK told him if that happens to let him know and he would try to find him something else. The below med has increased and pt is asking if there is something else  BYSTOLIC 10 MG tablet    [address]  [facility] [address]

## 2016-10-09 NOTE — Telephone Encounter (Signed)
Please call in a new prescription for generic Toprol, XL 100, #90 one tablet daily

## 2016-10-09 NOTE — Telephone Encounter (Signed)
° ° °  Pt said he is out of the med and would something call in today and  would like a call back

## 2016-10-09 NOTE — Telephone Encounter (Signed)
See message below, please advise.

## 2016-10-10 MED ORDER — METOPROLOL SUCCINATE ER 100 MG PO TB24: 100.0000 mg | ORAL_TABLET | Freq: Every day | ORAL | 3 refills | Status: DC

## 2016-10-10 NOTE — Telephone Encounter (Signed)
A new prescription for generic Toprol, XL 100, #90 one tablet daily was called into  [facility] . Pt called and informed.

## 2016-10-18 ENCOUNTER — Other Ambulatory Visit: Payer: Self-pay | Admitting: Internal Medicine

## 2016-10-25 ENCOUNTER — Other Ambulatory Visit: Payer: Self-pay | Admitting: Internal Medicine

## 2016-11-08 ENCOUNTER — Other Ambulatory Visit: Payer: Self-pay | Admitting: Internal Medicine

## 2016-11-19 ENCOUNTER — Other Ambulatory Visit: Payer: Self-pay | Admitting: Internal Medicine

## 2017-01-01 ENCOUNTER — Ambulatory Visit: Payer: BLUE CROSS/BLUE SHIELD | Admitting: Internal Medicine

## 2017-01-08 ENCOUNTER — Ambulatory Visit (INDEPENDENT_AMBULATORY_CARE_PROVIDER_SITE_OTHER): Payer: BLUE CROSS/BLUE SHIELD | Admitting: Internal Medicine

## 2017-01-08 ENCOUNTER — Encounter: Payer: Self-pay | Admitting: Internal Medicine

## 2017-01-08 VITALS — BP 166/70 | Temp 98.4°F | Wt 239.0 lb

## 2017-01-08 DIAGNOSIS — I10 Essential (primary) hypertension: Secondary | ICD-10-CM

## 2017-01-08 DIAGNOSIS — E119 Type 2 diabetes mellitus without complications: Secondary | ICD-10-CM

## 2017-01-08 NOTE — Progress Notes (Signed)
Subjective:    Patient ID: Javier [name], male    DOB: June 27, 1951, 66 y.o.   MRN: [id number]  HPI  66 year old patient who is seen today for follow-up of multi-drug-resistant hypertension.  He has a history of diabetes which has been tightly controlled with metformin therapy. He generally feels well. No cardiopulmonary complaints.  Past Medical History:  Diagnosis Date  . BENIGN PROSTATIC HYPERTROPHY 02/20/2007  . Depression   . Diabetes mellitus   . ELEVATED PROSTATE SPECIFIC ANTIGEN 02/26/2009  . 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 FASCIITIS, PLANTAR 09/30/2007  . GERD 02/20/2007  . Heart murmur   . HYPERTENSION 02/20/2007  . HYPOKALEMIA 03/02/2009  . Obesity      Social History   Social History  . Marital status: Married    Spouse name: N/A  . Number of children: N/A  . Years of education: N/A   Occupational History  . Not on file.   Social History Main Topics  . Smoking status: Never Smoker  . Smokeless tobacco: Never Used  . Alcohol use No  . Drug use: No  . Sexual activity: Not on file   Other Topics Concern  . Not on file   Social History Narrative  . No narrative on file    Past Surgical History:  Procedure Laterality Date  . KNEE SURGERY     left    No family history on file.  No Known Allergies  Current Outpatient Prescriptions on File Prior to Visit  Medication Sig Dispense Refill  . amLODipine (NORVASC) 10 MG tablet take 1 tablet by mouth once daily 30 tablet 3  . metFORMIN (GLUCOPHAGE-XR) 500 MG 24 hr tablet take 1 tablet by mouth once daily WITH BREAKFAST 30 tablet 5  . metoprolol succinate (TOPROL-XL) 100 MG 24 hr tablet Take 1 tablet (100 mg total) by mouth daily. Take with or immediately following a meal. 90 tablet 3  . Olmesartan-Amlodipine-HCTZ 40-10-25 MG TABS take 1 tablet by mouth once daily 30 tablet 5  . potassium chloride SA (K-DUR,KLOR-CON) 20 MEQ tablet take 1 tablet by mouth once daily 30 tablet 5  . sildenafil (VIAGRA)  100 MG tablet Take 0.5-1 tablets (50-100 mg total) by mouth daily as needed for erectile dysfunction. (Patient not taking: Reported on 01/08/2017) 5 tablet 11  . tadalafil (CIALIS) 20 MG tablet Take 1 tablet (20 mg total) by mouth daily. as directed (Patient not taking: Reported on 01/08/2017) 10 tablet 2   No current facility-administered medications on file prior to visit.     BP (!) 166/70 (BP Location: Left Arm, Patient Position: Sitting, Cuff Size: Large)   Temp 98.4 F (36.9 C) (Oral)   Wt 239 lb (108.4 kg)   BMI 36.88 kg/m     Review of Systems  Constitutional: Negative for appetite change, chills, fatigue and fever.  HENT: Negative for congestion, dental problem, ear pain, hearing loss, sore throat, tinnitus, trouble swallowing and voice change.   Eyes: Negative for pain, discharge and visual disturbance.  Respiratory: Negative for cough, chest tightness, wheezing and stridor.   Cardiovascular: Negative for chest pain, palpitations and leg swelling.  Gastrointestinal: Negative for abdominal distention, abdominal pain, blood in stool, constipation, diarrhea, nausea and vomiting.  Genitourinary: Negative for difficulty urinating, discharge, flank pain, genital sores, hematuria and urgency.  Musculoskeletal: Negative for arthralgias, back pain, gait problem, joint swelling, myalgias and neck stiffness.  Skin: Negative for rash.  Neurological: Positive for light-headedness and headaches. Negative for dizziness, syncope, speech difficulty,  weakness and numbness.  Hematological: Negative for adenopathy. Does not bruise/bleed easily.  Psychiatric/Behavioral: Negative for behavioral problems and dysphoric mood. The patient is not nervous/anxious.        Objective:   Physical Exam  Constitutional: He is oriented to person, place, and time. He appears well-developed.  Initial blood pressure 166/70 Follow-up blood pressure 126/62  HENT:  Head: Normocephalic.  Right Ear: External ear  normal.  Left Ear: External ear normal.  Eyes: Conjunctivae and EOM are normal.  Neck: Normal range of motion.  Cardiovascular: Normal rate and normal heart sounds.   Pulmonary/Chest: Breath sounds normal.  Abdominal: Bowel sounds are normal.  Musculoskeletal: Normal range of motion. He exhibits no edema or tenderness.  Neurological: He is alert and oriented to person, place, and time.  Psychiatric: He has a normal mood and affect. His behavior is normal.          Assessment & Plan:   Hypertension.  No change in therapy Exogenous obesity.  Weight loss encouraged Diabetes mellitus.  Tight control.  No change in therapy  Follow-up 6 months.  Check hemoglobin A1c at that time  [facility]

## 2017-01-08 NOTE — Patient Instructions (Signed)

## 2017-01-08 NOTE — Progress Notes (Signed)
Pre visit review using our clinic review tool, if applicable. No additional management support is needed unless otherwise documented below in the visit note. 

## 2017-01-08 NOTE — Progress Notes (Signed)
   Subjective:    Patient ID: Javier [name], male    DOB: 06-10-1951, 66 y.o.   MRN: [id number]  HPI  BP Readings from Last 3 Encounters:  01/08/17 (!) 166/70  10/02/16 (!) 148/72  09/04/16 (!) 156/70    Review of Systems    see above Objective:   Physical Exam  See above      Assessment & Plan:  See above

## 2017-02-15 ENCOUNTER — Other Ambulatory Visit: Payer: Self-pay | Admitting: Internal Medicine

## 2017-04-09 ENCOUNTER — Other Ambulatory Visit: Payer: Self-pay | Admitting: Internal Medicine

## 2017-05-11 ENCOUNTER — Other Ambulatory Visit: Payer: Self-pay | Admitting: Internal Medicine

## 2017-06-18 ENCOUNTER — Other Ambulatory Visit: Payer: Self-pay | Admitting: Internal Medicine

## 2017-07-09 ENCOUNTER — Encounter: Payer: Self-pay | Admitting: Internal Medicine

## 2017-07-09 ENCOUNTER — Ambulatory Visit (INDEPENDENT_AMBULATORY_CARE_PROVIDER_SITE_OTHER): Payer: BLUE CROSS/BLUE SHIELD | Admitting: Internal Medicine

## 2017-07-09 VITALS — BP 152/58 | HR 60 | Temp 98.0°F | Ht 67.5 in | Wt 239.8 lb

## 2017-07-09 DIAGNOSIS — R972 Elevated prostate specific antigen [PSA]: Secondary | ICD-10-CM

## 2017-07-09 DIAGNOSIS — E119 Type 2 diabetes mellitus without complications: Secondary | ICD-10-CM

## 2017-07-09 DIAGNOSIS — I10 Essential (primary) hypertension: Secondary | ICD-10-CM | POA: Diagnosis not present

## 2017-07-09 DIAGNOSIS — E785 Hyperlipidemia, unspecified: Secondary | ICD-10-CM

## 2017-07-09 MED ORDER — ATORVASTATIN CALCIUM 10 MG PO TABS: 10.0000 mg | ORAL_TABLET | Freq: Every day | ORAL | 3 refills | Status: DC

## 2017-07-09 NOTE — Patient Instructions (Signed)
Limit your sodium (Salt) intake  Please check your blood pressure on a regular basis.  If it is consistently greater than 150/90, please make an office appointment.  You need to lose weight.  Consider a lower calorie diet and regular exercise.   Please check your hemoglobin A1c every 3-6  Months    It is important that you exercise regularly, at least 20 minutes 3 to 4 times per week.  If you develop chest pain or shortness of breath seek  medical attention.

## 2017-07-09 NOTE — Progress Notes (Signed)
Subjective:    Patient ID: Javier [name], male    DOB: [birth year], 66 y.o.   MRN: [id number]  HPI  66 year old patient who is seen today for follow-up of type 2 diabetes.  [facility]  He states that about one month ago he noted a trace amount of blood in his undergarments and is scheduled for cystoscopy at the [facility]  He has had a eye examination within the past year.  No home blood sugar monitoring  Lab Results  Component Value Date   HGBA1C 5.8 08/28/2016   No concerns or complaints Denies any cardiopulmonary complaints Medical regimen does not include statin therapy  Past Medical History:  Diagnosis Date  . BENIGN PROSTATIC HYPERTROPHY 02/20/2007  . Depression   . Diabetes mellitus   . ELEVATED PROSTATE SPECIFIC ANTIGEN 02/26/2009  . 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 FASCIITIS, PLANTAR 09/30/2007  . GERD 02/20/2007  . Heart murmur   . HYPERTENSION 02/20/2007  . HYPOKALEMIA 03/02/2009  . Obesity      Social History   Social History  . Marital status: Married    Spouse name: N/A  . Number of children: N/A  . Years of education: N/A   Occupational History  . Not on file.   Social History Main Topics  . Smoking status: Never Smoker  . Smokeless tobacco: Never Used  . Alcohol use No  . Drug use: No  . Sexual activity: Not on file   Other Topics Concern  . Not on file   Social History Narrative  . No narrative on file    Past Surgical History:  Procedure Laterality Date  . KNEE SURGERY     left    No family history on file.  No Known Allergies  Current Outpatient Prescriptions on File Prior to Visit  Medication Sig Dispense Refill  . amLODipine (NORVASC) 10 MG tablet take 1 tablet by mouth once daily 30 tablet 3  . metFORMIN (GLUCOPHAGE-XR) 500 MG 24 hr tablet take 1 tablet by mouth once daily WITH BREAKFAST 30 tablet 5  . metoprolol succinate (TOPROL-XL) 100 MG 24 hr tablet Take 1 tablet (100 mg total) by  mouth daily. Take with or immediately following a meal. 90 tablet 3  . Olmesartan-Amlodipine-HCTZ 40-10-25 MG TABS take 1 tablet by mouth once daily 30 tablet 5  . potassium chloride SA (K-DUR,KLOR-CON) 20 MEQ tablet take 1 tablet by mouth once daily 30 tablet 5  . sildenafil (VIAGRA) 100 MG tablet Take 0.5-1 tablets (50-100 mg total) by mouth daily as needed for erectile dysfunction. 5 tablet 11  . tadalafil (CIALIS) 20 MG tablet Take 1 tablet (20 mg total) by mouth daily. as directed 10 tablet 2   No current facility-administered medications on file prior to visit.     BP (!) 152/58 (BP Location: Left Arm, Patient Position: Sitting, Cuff Size: Normal)   Pulse 60   Temp 98 F (36.7 C) (Oral)   Ht 5' 7.5" (1.715 m)   Wt 239 lb 12.8 oz (108.8 kg)   SpO2 98%   BMI 37.00 kg/m     Review of Systems  Constitutional: Negative for appetite change, chills, fatigue and fever.  HENT: Negative for congestion, dental problem, ear pain, hearing loss, sore throat, tinnitus, trouble swallowing and voice change.   Eyes: Negative for pain, discharge and visual disturbance.  Respiratory: Negative for cough, chest tightness, wheezing and stridor.   Cardiovascular: Negative for chest pain, palpitations  and leg swelling.  Gastrointestinal: Negative for abdominal distention, abdominal pain, blood in stool, constipation, diarrhea, nausea and vomiting.  Genitourinary: Negative for difficulty urinating, discharge, flank pain, genital sores, hematuria and urgency.  Musculoskeletal: Negative for arthralgias, back pain, gait problem, joint swelling, myalgias and neck stiffness.  Skin: Negative for rash.  Neurological: Negative for dizziness, syncope, speech difficulty, weakness, numbness and headaches.  Hematological: Negative for adenopathy. Does not bruise/bleed easily.  Psychiatric/Behavioral: Negative for behavioral problems and dysphoric mood. The patient is not nervous/anxious.        Objective:    Physical Exam  Constitutional: He is oriented to person, place, and time. He appears well-developed.  Blood pressure 142/62  HENT:  Head: Normocephalic.  Right Ear: External ear normal.  Left Ear: External ear normal.  Eyes: Conjunctivae and EOM are normal.  Neck: Normal range of motion.  Cardiovascular: Normal rate and normal heart sounds.   Pulmonary/Chest: Breath sounds normal.  Abdominal: Bowel sounds are normal.  Musculoskeletal: Normal range of motion. He exhibits no edema or tenderness.  Neurological: He is alert and oriented to person, place, and time.  Psychiatric: He has a normal mood and affect. His behavior is normal.          Assessment & Plan:   Diabetes mellitus.  Will check a hemoglobin A1c.  Benefits of statin therapy discussed.  Will start atorvastatin 10 mg daily Essential hypertension.  Continue present regimen BPH.  Patient started on Flomax at the [facility].  Cystoscopy planned  CPX 6 months   Rogelia Boga

## 2017-10-04 ENCOUNTER — Other Ambulatory Visit: Payer: Self-pay | Admitting: Internal Medicine

## 2017-10-10 ENCOUNTER — Other Ambulatory Visit: Payer: Self-pay | Admitting: Internal Medicine

## 2017-10-15 ENCOUNTER — Other Ambulatory Visit: Payer: Self-pay | Admitting: Internal Medicine

## 2017-11-12 ENCOUNTER — Other Ambulatory Visit: Payer: Self-pay | Admitting: Internal Medicine

## 2017-11-14 ENCOUNTER — Other Ambulatory Visit: Payer: Self-pay | Admitting: Internal Medicine

## 2017-12-13 ENCOUNTER — Other Ambulatory Visit: Payer: Self-pay | Admitting: Internal Medicine

## 2017-12-19 ENCOUNTER — Telehealth: Payer: Self-pay | Admitting: Internal Medicine

## 2017-12-19 NOTE — Telephone Encounter (Signed)
Pt requesting refill of Cialis and Viagra. Last prescription for Cialis was written in 2016 and last prescription for Viagra was written in 2017.  LOV: 07/09/17   Dr. Ronn MelenaKwiatkowski   [facility] [address]

## 2017-12-19 NOTE — Telephone Encounter (Signed)
Copied from CRM [id number]. Topic: Quick Communication - Rx Refill/Question >> Dec 19, 2017  5:53 PM Javier [name], Javier [name], NT wrote: Medication: sildenafil (VIAGRA) 100 MG tablet , tadalafil (CIALIS) 20 MG tablet Has the patient contacted their pharmacy? Yes.   (Agent: If no, request that the patient contact the pharmacy for the refill.) Preferred Pharmacy (with phone number or street name): [facility] [phone]#[id number] - [place], [place] - [address] [phone] (Phone) [phone] (Fax)  Pt states that the pharmacy can not see these in their system. Please advise  Agent: Please be advised that RX refills may take up to 3 business days. We ask that you follow-up with your pharmacy.

## 2017-12-20 ENCOUNTER — Telehealth: Payer: Self-pay

## 2017-12-20 MED ORDER — TADALAFIL 20 MG PO TABS: 20.0000 mg | ORAL_TABLET | Freq: Every day | ORAL | 0 refills | Status: DC

## 2017-12-20 NOTE — Telephone Encounter (Signed)
5 tabs of Cialis sent in until Monday for [facility] and [facility]

## 2017-12-20 NOTE — Telephone Encounter (Signed)
5 tabs of Cialis sent in until Dr.kwiatkowski comes back Monday to remove and update patient's med list.

## 2017-12-24 MED ORDER — TADALAFIL 20 MG PO TABS: 20.0000 mg | ORAL_TABLET | Freq: Every day | ORAL | 6 refills | Status: DC

## 2017-12-24 NOTE — Addendum Note (Signed)
Addended by: Waymon AmatoJOHNSON, Menashe Kafer R on: 12/24/2017 12:18 PM   Modules accepted: Orders

## 2017-12-24 NOTE — Telephone Encounter (Signed)
Patient aware medication sent in to preferred pharmacy.

## 2017-12-24 NOTE — Telephone Encounter (Signed)
Cialis 20 mg #12 refill x6

## 2018-01-07 ENCOUNTER — Encounter: Payer: Self-pay | Admitting: Internal Medicine

## 2018-01-07 ENCOUNTER — Ambulatory Visit (INDEPENDENT_AMBULATORY_CARE_PROVIDER_SITE_OTHER): Payer: Commercial Managed Care - PPO | Admitting: Internal Medicine

## 2018-01-07 ENCOUNTER — Encounter: Payer: Self-pay | Admitting: Gastroenterology

## 2018-01-07 VITALS — BP 138/60 | HR 64 | Temp 98.5°F | Wt 237.0 lb

## 2018-01-07 DIAGNOSIS — I10 Essential (primary) hypertension: Secondary | ICD-10-CM | POA: Diagnosis not present

## 2018-01-07 DIAGNOSIS — E119 Type 2 diabetes mellitus without complications: Secondary | ICD-10-CM

## 2018-01-07 DIAGNOSIS — Z0000 Encounter for general adult medical examination without abnormal findings: Secondary | ICD-10-CM

## 2018-01-07 LAB — COMPREHENSIVE METABOLIC PANEL
ALBUMIN: 3.7 g/dL (ref 3.5–5.2)
ALK PHOS: 55 U/L (ref 39–117)
ALT: 21 U/L (ref 0–53)
AST: 25 U/L (ref 0–37)
BUN: 15 mg/dL (ref 6–23)
CHLORIDE: 101 meq/L (ref 96–112)
CO2: 30 mEq/L (ref 19–32)
Calcium: 9 mg/dL (ref 8.4–10.5)
Creatinine, Ser: 0.83 mg/dL (ref 0.40–1.50)
GFR: 118.82 mL/min (ref >60.00)
GLUCOSE: 119 mg/dL — AB (ref 70–99)
POTASSIUM: 3.4 meq/L — AB (ref 3.5–5.1)
Sodium: 140 mEq/L (ref 135–145)
TOTAL PROTEIN: 7 g/dL (ref 6.0–8.3)
Total Bilirubin: 0.7 mg/dL (ref 0.2–1.2)

## 2018-01-07 LAB — LIPID PANEL
Cholesterol: 121 mg/dL (ref 0–200)
HDL: 51.3 mg/dL
LDL Cholesterol: 58 mg/dL (ref 0–99)
NonHDL: 69.34
Total CHOL/HDL Ratio: 2
Triglycerides: 56 mg/dL (ref 0.0–149.0)
VLDL: 11.2 mg/dL (ref 0.0–40.0)

## 2018-01-07 LAB — CBC WITH DIFFERENTIAL/PLATELET
Basophils Absolute: 0 K/uL (ref 0.0–0.1)
Basophils Relative: 0.6 % (ref 0.0–3.0)
Eosinophils Absolute: 0.1 K/uL (ref 0.0–0.7)
Eosinophils Relative: 1.9 % (ref 0.0–5.0)
HCT: 41.2 % (ref 39.0–52.0)
Hemoglobin: 14 g/dL (ref 13.0–17.0)
Lymphocytes Relative: 37.5 % (ref 12.0–46.0)
Lymphs Abs: 1.5 K/uL (ref 0.7–4.0)
MCHC: 33.8 g/dL (ref 30.0–36.0)
MCV: 84.5 fl (ref 78.0–100.0)
Monocytes Absolute: 0.5 K/uL (ref 0.1–1.0)
Monocytes Relative: 12.8 % — ABNORMAL HIGH (ref 3.0–12.0)
Neutro Abs: 1.9 K/uL (ref 1.4–7.7)
Neutrophils Relative %: 47.2 % (ref 43.0–77.0)
Platelets: 251 K/uL (ref 150.0–400.0)
RBC: 4.88 Mil/uL (ref 4.22–5.81)
RDW: 14.6 % (ref 11.5–15.5)
WBC: 4 K/uL (ref 4.0–10.5)

## 2018-01-07 LAB — TSH: TSH: 2.19 u[IU]/mL (ref 0.35–4.50)

## 2018-01-07 LAB — MICROALBUMIN / CREATININE URINE RATIO
Creatinine,U: 374.9 mg/dL
MICROALB/CREAT RATIO: 0.7 mg/g (ref 0.0–30.0)
Microalb, Ur: 2.5 mg/dL — ABNORMAL HIGH (ref 0.0–1.9)

## 2018-01-07 LAB — HEMOGLOBIN A1C: HEMOGLOBIN A1C: 6.1 % (ref 4.6–6.5)

## 2018-01-07 NOTE — Progress Notes (Signed)
Subjective:    Patient ID: Javier [name], male    DOB: 08-Oct-1950, 67 y.o.   MRN: [id number]  HPI 67 year old patient who is seen today for a preventive health examination.  He has a history of essential hypertension.  He has type 2 diabetes which has been controlled with metformin therapy only.  No recent hemoglobin A1c but in the past have been in a normal range.  He has a history of ADD controlled with Viagra.  Last colonoscopy 2016  [facility].  Patient does have frequent nocturia that interferes with sleep.  He was seen by urology last week  Past Medical History:  Diagnosis Date  . BENIGN PROSTATIC HYPERTROPHY 02/20/2007  . Depression   . Diabetes mellitus   . ELEVATED PROSTATE SPECIFIC ANTIGEN 02/26/2009  . 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 FASCIITIS, PLANTAR 09/30/2007  . GERD 02/20/2007  . Heart murmur   . HYPERTENSION 02/20/2007  . HYPOKALEMIA 03/02/2009  . Obesity      Social History   Socioeconomic History  . Marital status: Married    Spouse name: Not on file  . Number of children: Not on file  . Years of education: Not on file  . Highest education level: Not on file  Occupational History  . Not on file  Social Needs  . Financial resource strain: Not on file  . Food insecurity:    Worry: Not on file    Inability: Not on file  . Transportation needs:    Medical: Not on file    Non-medical: Not on file  Tobacco Use  . Smoking status: Never Smoker  . Smokeless tobacco: Never Used  Substance and Sexual Activity  . Alcohol use: No  . Drug use: No  . Sexual activity: Not on file  Lifestyle  . Physical activity:    Days per week: Not on file    Minutes per session: Not on file  . Stress: Not on file  Relationships  . Social connections:    Talks on phone: Not on file    Gets together: Not on file    Attends religious service: Not on file    Active member of club or organization: Not on file    Attends meetings  of clubs or organizations: Not on file    Relationship status: Not on file  . Intimate partner violence:    Fear of current or ex partner: Not on file    Emotionally abused: Not on file    Physically abused: Not on file    Forced sexual activity: Not on file  Other Topics Concern  . Not on file  Social History Narrative  . Not on file    Past Surgical History:  Procedure Laterality Date  . KNEE SURGERY     left    History reviewed. No pertinent family history.  No Known Allergies  Current Outpatient Medications on File Prior to Visit  Medication Sig Dispense Refill  . amLODipine (NORVASC) 10 MG tablet take 1 tablet by mouth once daily 30 tablet 3  . atorvastatin (LIPITOR) 10 MG tablet Take 1 tablet (10 mg total) by mouth daily. 90 tablet 3  . metFORMIN (GLUCOPHAGE-XR) 500 MG 24 hr tablet TAKE 1 TABLET BY MOUTH ONCE DAILY WITH BREAKFAST 30 tablet 0  . metoprolol succinate (TOPROL-XL) 100 MG 24 hr tablet take 1 tablet by mouth once daily (TAKE WITH OR IMMEDIATELY FOLLOWING A MEAL) 90 tablet 3  .  Olmesartan-amLODIPine-HCTZ 40-10-25 MG TABS TAKE 1 TABLET BY MOUTH ONCE DAILY 30 tablet 2  . potassium chloride SA (K-DUR,KLOR-CON) 20 MEQ tablet take 1 tablet by mouth once daily 30 tablet 5  . sildenafil (VIAGRA) 100 MG tablet Take 0.5-1 tablets (50-100 mg total) by mouth daily as needed for erectile dysfunction. 5 tablet 11  . tadalafil (CIALIS) 20 MG tablet Take 1 tablet (20 mg total) by mouth daily. as directed 12 tablet 6  . tamsulosin (FLOMAX) 0.4 MG CAPS capsule Take 0.4 mg by mouth daily.     No current facility-administered medications on file prior to visit.     BP 138/60 (BP Location: Right Arm, Patient Position: Sitting, Cuff Size: Large)   Pulse 64   Temp 98.5 F (36.9 C) (Oral)   Wt 237 lb (107.5 kg)   SpO2 96%   BMI 36.57 kg/m      Review of Systems  Constitutional: Negative for appetite change, chills, fatigue and fever.  HENT: Negative for congestion, dental  problem, ear pain, hearing loss, sore throat, tinnitus, trouble swallowing and voice change.   Eyes: Negative for pain, discharge and visual disturbance.  Respiratory: Negative for cough, chest tightness, wheezing and stridor.        Loud snoring History of mild daytime sleepiness but bothered with nocturia  Cardiovascular: Negative for chest pain, palpitations and leg swelling.  Gastrointestinal: Negative for abdominal distention, abdominal pain, blood in stool, constipation, diarrhea, nausea and vomiting.  Endocrine: Positive for polyuria.  Genitourinary: Positive for difficulty urinating and frequency. Negative for discharge, flank pain, genital sores, hematuria and urgency.  Musculoskeletal: Negative for arthralgias, back pain, gait problem, joint swelling, myalgias and neck stiffness.  Skin: Negative for rash.  Neurological: Negative for dizziness, syncope, speech difficulty, weakness, numbness and headaches.  Hematological: Negative for adenopathy. Does not bruise/bleed easily.  Psychiatric/Behavioral: Negative for behavioral problems and dysphoric mood. The patient is not nervous/anxious.        Objective:   Physical Exam  Constitutional: He appears well-developed and well-nourished.  Blood pressure 130/70  HENT:  Head: Normocephalic and atraumatic.  Right Ear: External ear normal.  Left Ear: External ear normal.  Nose: Nose normal.  Mouth/Throat: Oropharynx is clear and moist.  Pharyngeal crowding with low hanging soft palate  Eyes: Pupils are equal, round, and reactive to light. Conjunctivae and EOM are normal. No scleral icterus.  Neck: Normal range of motion. Neck supple. No JVD present. No thyromegaly present.  Cardiovascular: Regular rhythm, normal heart sounds and intact distal pulses. Exam reveals no gallop and no friction rub.  No murmur heard. Pedal pulses full  Pulmonary/Chest: Effort normal and breath sounds normal. He exhibits no tenderness.  Abdominal: Soft.  Bowel sounds are normal. He exhibits no distension and no mass. There is no tenderness.  Genitourinary: Penis normal.  Musculoskeletal: Normal range of motion. He exhibits no edema or tenderness.  Lymphadenopathy:    He has no cervical adenopathy.  Neurological: He is alert. He has normal reflexes. No cranial nerve deficit. Coordination normal.  Skin: Skin is warm and dry. No rash noted.  Psychiatric: He has a normal mood and affect. His behavior is normal.          Assessment & Plan:   Preventive health examination.  We will schedule follow-up colonoscopy OSA suspect.  We will set up for sleep study Essential hypertension stable.  No change in medical regimen Obesity.  Weight loss encouraged Dyslipidemia will review a lipid profile Diabetes mellitus type 2.  We will check a hemoglobin A1c urine for microalbumin  Follow-up 6 months Annual eye examination scheduled Schedule colonoscopy Schedule evaluation for possible OSA  Rogelia Boga

## 2018-01-07 NOTE — Patient Instructions (Signed)
Limit your sodium (Salt) intake   Please check your hemoglobin A1c every 3-6 months  Please check your blood pressure on a regular basis.  If it is consistently greater than 140/90, please make an office appointment.    It is important that you exercise regularly, at least 20 minutes 3 to 4 times per week.  If you develop chest pain or shortness of breath seek  medical attention.  You need to lose weight.  Consider a lower calorie diet and regular exercise.  Please see your eye doctor yearly to check for diabetic eye damage  Urology follow-up as scheduled  Schedule your colonoscopy to help detect colon cancer.  Sleep study evaluation as discussed

## 2018-01-17 ENCOUNTER — Other Ambulatory Visit: Payer: Self-pay | Admitting: Internal Medicine

## 2018-02-13 ENCOUNTER — Other Ambulatory Visit: Payer: Self-pay | Admitting: Internal Medicine

## 2018-03-05 ENCOUNTER — Encounter: Payer: Self-pay | Admitting: Internal Medicine

## 2018-03-06 ENCOUNTER — Telehealth: Payer: Self-pay | Admitting: Internal Medicine

## 2018-03-06 ENCOUNTER — Other Ambulatory Visit: Payer: Self-pay | Admitting: Internal Medicine

## 2018-03-06 NOTE — Telephone Encounter (Signed)
Please advise 

## 2018-03-06 NOTE — Telephone Encounter (Signed)
Copied from CRM [id number]. Topic: Quick Communication - Rx Refill/Question >> Mar 06, 2018  1:30 PM Dawoud, Shanda BumpsJessica L wrote: Medication: potassium chloride SA (K-DUR,KLOR-CON) 20 MEQ tablet . Pt called and stated that [facility] Caswell [facility]ubbs [facility] [place] advised pt to get pcp to change medication to a dispersible potassium. Pt not sure what the other medication is but says its a medication to help with urinating as often. Pt was seen for an enlarged prostate. Per pt

## 2018-03-17 ENCOUNTER — Other Ambulatory Visit: Payer: Self-pay | Admitting: Internal Medicine

## 2018-03-18 ENCOUNTER — Encounter: Payer: Self-pay | Admitting: Gastroenterology

## 2018-04-16 ENCOUNTER — Other Ambulatory Visit: Payer: Self-pay | Admitting: Internal Medicine

## 2018-05-01 ENCOUNTER — Other Ambulatory Visit: Payer: Self-pay | Admitting: Internal Medicine

## 2018-05-19 ENCOUNTER — Other Ambulatory Visit: Payer: Self-pay | Admitting: Internal Medicine

## 2018-06-03 ENCOUNTER — Other Ambulatory Visit: Payer: Self-pay | Admitting: Internal Medicine

## 2018-06-03 ENCOUNTER — Ambulatory Visit: Payer: Commercial Managed Care - PPO | Admitting: Internal Medicine

## 2018-06-03 ENCOUNTER — Encounter: Payer: Self-pay | Admitting: Internal Medicine

## 2018-06-03 VITALS — BP 138/58 | HR 66 | Temp 98.7°F | Wt 246.4 lb

## 2018-06-03 DIAGNOSIS — I10 Essential (primary) hypertension: Secondary | ICD-10-CM

## 2018-06-03 DIAGNOSIS — K219 Gastro-esophageal reflux disease without esophagitis: Secondary | ICD-10-CM | POA: Diagnosis not present

## 2018-06-03 DIAGNOSIS — E119 Type 2 diabetes mellitus without complications: Secondary | ICD-10-CM | POA: Diagnosis not present

## 2018-06-03 MED ORDER — TAMSULOSIN HCL 0.4 MG PO CAPS: 0.4000 mg | ORAL_CAPSULE | Freq: Every day | ORAL | 0 refills | Status: DC

## 2018-06-03 MED ORDER — OLMESARTAN-AMLODIPINE-HCTZ 40-10-25 MG PO TABS: 1.0000 | ORAL_TABLET | Freq: Every day | ORAL | 0 refills | Status: DC

## 2018-06-03 MED ORDER — AMLODIPINE BESYLATE 10 MG PO TABS: 10.0000 mg | ORAL_TABLET | Freq: Every day | ORAL | 0 refills | Status: DC

## 2018-06-03 MED ORDER — METOPROLOL SUCCINATE ER 100 MG PO TB24: ORAL_TABLET | ORAL | 3 refills | Status: DC

## 2018-06-03 MED ORDER — ATORVASTATIN CALCIUM 10 MG PO TABS: 10.0000 mg | ORAL_TABLET | Freq: Every day | ORAL | 3 refills | Status: DC

## 2018-06-03 MED ORDER — METFORMIN HCL ER 500 MG PO TB24: ORAL_TABLET | ORAL | 0 refills | Status: DC

## 2018-06-03 MED ORDER — TADALAFIL 20 MG PO TABS: 20.0000 mg | ORAL_TABLET | Freq: Every day | ORAL | 6 refills | Status: DC

## 2018-06-03 NOTE — Progress Notes (Signed)
Subjective:    Patient ID: Javier [name], male    DOB: 1950-10-01, 67 y.o.   MRN: [id number]  HPI  67 year old patient who is seen today for follow-up of type 2 diabetes.  He has multidrug resistant hypertension. In general doing well.  No concerns or complaints.  He does have a history of BPH. Continues to work No recent eye examination.  He has received a recall letter from ophthalmology Denies any cardiopulmonary complaints.  Past Medical History:  Diagnosis Date  . BENIGN PROSTATIC HYPERTROPHY 02/20/2007  . Depression   . Diabetes mellitus   . ELEVATED PROSTATE SPECIFIC ANTIGEN 02/26/2009  . 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 FASCIITIS, PLANTAR 09/30/2007  . GERD 02/20/2007  . Heart murmur   . HYPERTENSION 02/20/2007  . HYPOKALEMIA 03/02/2009  . Obesity      Social History   Socioeconomic History  . Marital status: Married    Spouse name: Not on file  . Number of children: Not on file  . Years of education: Not on file  . Highest education level: Not on file  Occupational History  . Not on file  Social Needs  . Financial resource strain: Not on file  . Food insecurity:    Worry: Not on file    Inability: Not on file  . Transportation needs:    Medical: Not on file    Non-medical: Not on file  Tobacco Use  . Smoking status: Never Smoker  . Smokeless tobacco: Never Used  Substance and Sexual Activity  . Alcohol use: No  . Drug use: No  . Sexual activity: Not on file  Lifestyle  . Physical activity:    Days per week: Not on file    Minutes per session: Not on file  . Stress: Not on file  Relationships  . Social connections:    Talks on phone: Not on file    Gets together: Not on file    Attends religious service: Not on file    Active member of club or organization: Not on file    Attends meetings of clubs or organizations: Not on file    Relationship status: Not on file  . Intimate partner violence:    Fear of current or ex partner: Not on file    Emotionally abused: Not on file    Physically abused: Not on file    Forced sexual activity: Not on file  Other Topics Concern  . Not on file  Social History Narrative  . Not on file    Past Surgical History:  Procedure Laterality Date  . KNEE SURGERY     left    History reviewed. No pertinent family history.  No Known Allergies  Current Outpatient Medications on File Prior to Visit  Medication Sig Dispense Refill  . amLODipine (NORVASC) 10 MG tablet TAKE 1 TABLET BY MOUTH ONCE DAILY 30 tablet 0  . atorvastatin (LIPITOR) 10 MG tablet Take 1 tablet (10 mg total) by mouth daily. 90 tablet 3  . metFORMIN (GLUCOPHAGE-XR) 500 MG 24 hr tablet TAKE 1 TABLET BY MOUTH ONCE DAILY WITH BREAKFAST 30 tablet 0  . metoprolol succinate (TOPROL-XL) 100 MG 24 hr tablet take 1 tablet by mouth once daily (TAKE WITH OR IMMEDIATELY FOLLOWING A MEAL) 90 tablet 3  . Olmesartan-amLODIPine-HCTZ 40-10-25 MG TABS TAKE 1 TABLET BY MOUTH ONCE DAILY 30 tablet 0  . potassium chloride SA (K-DUR,KLOR-CON) 20 MEQ tablet TAKE 1 TABLET BY MOUTH ONCE DAILY 30 tablet 0  .  sildenafil (VIAGRA) 100 MG tablet Take 0.5-1 tablets (50-100 mg total) by mouth daily as needed for erectile dysfunction. 5 tablet 11  . tadalafil (CIALIS) 20 MG tablet Take 1 tablet (20 mg total) by mouth daily. as directed 12 tablet 6  . tamsulosin (FLOMAX) 0.4 MG CAPS capsule Take 0.4 mg by mouth daily.     No current facility-administered medications on file prior to visit.     BP (!) 138/58 (BP Location: Right Arm, Patient Position: Sitting, Cuff Size: Large)   Pulse 66   Temp 98.7 F (37.1 C) (Oral)   Wt 246 lb 6.4 oz (111.8 kg)   SpO2 96%   BMI 38.02 kg/m     Review of Systems  Constitutional: Negative for appetite change, chills, fatigue and fever.  HENT: Negative for congestion, dental problem, ear pain, hearing loss, sore throat, tinnitus, trouble swallowing and voice change.   Eyes: Negative for pain, discharge and visual  disturbance.  Respiratory: Negative for cough, chest tightness, wheezing and stridor.   Cardiovascular: Negative for chest pain, palpitations and leg swelling.  Gastrointestinal: Negative for abdominal distention, abdominal pain, blood in stool, constipation, diarrhea, nausea and vomiting.  Genitourinary: Negative for difficulty urinating, discharge, flank pain, genital sores, hematuria and urgency.  Musculoskeletal: Negative for arthralgias, back pain, gait problem, joint swelling, myalgias and neck stiffness.  Skin: Negative for rash.  Neurological: Negative for dizziness, syncope, speech difficulty, weakness, numbness and headaches.  Hematological: Negative for adenopathy. Does not bruise/bleed easily.  Psychiatric/Behavioral: Negative for behavioral problems and dysphoric mood. The patient is not nervous/anxious.        Objective:   Physical Exam  Constitutional: He is oriented to person, place, and time. He appears well-developed.  Weight 246 Blood pressure well controlled  HENT:  Head: Normocephalic.  Right Ear: External ear normal.  Left Ear: External ear normal.  Eyes: Conjunctivae and EOM are normal.  Neck: Normal range of motion.  Cardiovascular: Normal rate and normal heart sounds.  Pulmonary/Chest: Breath sounds normal.  Abdominal: Bowel sounds are normal.  Musculoskeletal: Normal range of motion. He exhibits no edema or tenderness.  Neurological: He is alert and oriented to person, place, and time.  Psychiatric: He has a normal mood and affect. His behavior is normal.          Assessment & Plan:  Diabetes mellitus.   Lab Results  Component Value Date   HGBA1C 6.1 01/07/2018    Essential hypertension stable Obesity weight loss encouraged Dyslipidemia continue statin therapy BPH well-controlled  Patient will follow-up with a new PCP in approximately 6 months for his annual exam  [employer]

## 2018-06-03 NOTE — Patient Instructions (Signed)

## 2018-06-06 ENCOUNTER — Encounter: Payer: Self-pay | Admitting: Internal Medicine

## 2018-06-06 LAB — POCT GLYCOSYLATED HEMOGLOBIN (HGB A1C): Hemoglobin A1C: 5.7 % — AB (ref 4.0–5.6)

## 2018-06-18 ENCOUNTER — Other Ambulatory Visit: Payer: Self-pay | Admitting: Internal Medicine

## 2018-06-18 NOTE — Telephone Encounter (Signed)
Pt aware to follow up with new PCP for further refills due to Dr.Kwiakowski's retiring.  

## 2018-06-28 ENCOUNTER — Ambulatory Visit: Payer: Self-pay

## 2018-06-28 NOTE — Telephone Encounter (Signed)
Patient called in with c/o "left buttock pain/lower back pain." He says "it's more in my left buttock and is there when I sit down, but goes away as I am walking. I carry a heavy wallet in my back pocket and I think this is the problem. The pain is a 5-6 and it goes into my thigh at times. I looked on the internet and it said it may be sciatica pain. Dr. Kirtland Bouchard told me not to take Advil with the medicines I take, but the other day it was hurting so bad, I had to take it and it made me feel bad. I haven't taken anything else for the pain. What else can I take?" I asked has he ever been evaluated for this pain, he denies. I advised he will need to be seen in the office, because this is something new he is experiencing, he verbalized understanding. I asked about other symptoms, he denies. According to protocol, see PCP within 3 days, appointment scheduled for Monday, 07/01/18 at 1100 with Dr. Hassan Rowan, care advice given, patient verbalized understanding.   Reason for Disposition . [1] MODERATE back pain (e.g., interferes with normal activities) AND [2] present > 3 days  Answer Assessment - Initial Assessment Questions 1. ONSET: "When did the pain begin?"      Last week 2. LOCATION: "Where does it hurt?" (upper, mid or lower back)     Left buttock more  3. SEVERITY: "How bad is the pain?"  (e.g., Scale 1-10; mild, moderate, or severe)   - MILD (1-3): doesn't interfere with normal activities    - MODERATE (4-7): interferes with normal activities or awakens from sleep    - SEVERE (8-10): excruciating pain, unable to do any normal activities      5-6 right now 4. PATTERN: "Is the pain constant?" (e.g., yes, no; constant, intermittent)      Intermittent, more when sitting 5. RADIATION: "Does the pain shoot into your legs or elsewhere?"     Yes, my thigh 6. CAUSE:  "What do you think is causing the back pain?"      Sciatica 7. BACK OVERUSE:  "Any recent lifting of heavy objects, strenuous work or  exercise?"     No 8. MEDICATIONS: "What have you taken so far for the pain?" (e.g., nothing, acetaminophen, NSAIDS)     Advil one time 9. NEUROLOGIC SYMPTOMS: "Do you have any weakness, numbness, or problems with bowel/bladder control?"     No 10. OTHER SYMPTOMS: "Do you have any other symptoms?" (e.g., fever, abdominal pain, burning with urination, blood in urine)     No  11. PREGNANCY: "Is there any chance you are pregnant?" (e.g., yes, no; LMP)       N/A  Protocols used: BACK PAIN-A-AH

## 2018-06-29 ENCOUNTER — Other Ambulatory Visit: Payer: Self-pay | Admitting: Internal Medicine

## 2018-07-01 ENCOUNTER — Encounter: Payer: Self-pay | Admitting: Family Medicine

## 2018-07-01 ENCOUNTER — Ambulatory Visit: Payer: Commercial Managed Care - PPO | Admitting: Family Medicine

## 2018-07-01 VITALS — BP 134/64 | HR 69 | Temp 98.3°F | Wt 246.6 lb

## 2018-07-01 DIAGNOSIS — Z23 Encounter for immunization: Secondary | ICD-10-CM

## 2018-07-01 DIAGNOSIS — M5432 Sciatica, left side: Secondary | ICD-10-CM

## 2018-07-01 MED ORDER — METHYLPREDNISOLONE ACETATE 80 MG/ML IJ SUSP
80.0000 mg | Freq: Once | INTRAMUSCULAR | Status: AC
  Administered 2018-07-01: 80 mg via INTRAMUSCULAR

## 2018-07-01 MED ORDER — METHYLPREDNISOLONE ACETATE 80 MG/ML IJ SUSP: 80.0000 mg | Freq: Once | INTRAMUSCULAR | Status: DC

## 2018-07-01 NOTE — Progress Notes (Signed)
Javier [name] DOB: 12/01/1950 Encounter date: 07/01/2018  This is a 67 y.o. male who presents with Chief Complaint  Patient presents with  . Pain    left glute, x 2 weeks, radiates down the leg, hurts to sit,     History of present illness:  Thinks started from having wallet in back pocket on left side; lying back on sofa. Has happened before. Is getting better. As long as up and walking around is ok, but sitting for awhile worsens. Also down in left calf. Initially was 10/10 and now down to 6/10. No lower back pain.   Talked with triage nurse and has been taking tylenol ES q 8 hours which helped. Didn't take any this morning.   Happened a couple of years ago as well; just went away with time.   No weakness in leg. No numbness or tingling in left leg.     No Known Allergies Current Meds  Medication Sig  . amLODipine (NORVASC) 10 MG tablet TAKE 1 TABLET BY MOUTH ONCE DAILY  . metFORMIN (GLUCOPHAGE-XR) 500 MG 24 hr tablet TAKE 1 TABLET BY MOUTH ONCE DAILY WITH BREAKFAST  . metoprolol succinate (TOPROL-XL) 100 MG 24 hr tablet take 1 tablet by mouth once daily (TAKE WITH OR IMMEDIATELY FOLLOWING A MEAL)  . Olmesartan-amLODIPine-HCTZ 40-10-25 MG TABS Take 1 tablet by mouth daily.  . potassium chloride SA (K-DUR,KLOR-CON) 20 MEQ tablet TAKE 1 TABLET BY MOUTH ONCE DAILY  . sildenafil (VIAGRA) 100 MG tablet Take 0.5-1 tablets (50-100 mg total) by mouth daily as needed for erectile dysfunction.  . tadalafil (CIALIS) 20 MG tablet Take 1 tablet (20 mg total) by mouth daily. as directed  . tamsulosin (FLOMAX) 0.4 MG CAPS capsule Take 1 capsule (0.4 mg total) by mouth daily.    Review of Systems  Constitutional: Negative for chills, fatigue and fever.  Respiratory: Negative for cough, chest tightness, shortness of breath and wheezing.   Cardiovascular: Negative for chest pain, palpitations and leg swelling.  Musculoskeletal: Negative for back pain.       Denies back pain; states much  more in left buttock    Objective:  BP 134/64   Pulse 69   Temp 98.3 F (36.8 C) (Oral)   Wt 246 lb 9.6 oz (111.9 kg)   SpO2 94%   BMI 38.05 kg/m   Weight: 246 lb 9.6 oz (111.9 kg)   BP Readings from Last 3 Encounters:  07/01/18 134/64  06/03/18 (!) 138/58  01/07/18 138/60   Wt Readings from Last 3 Encounters:  07/01/18 246 lb 9.6 oz (111.9 kg)  06/03/18 246 lb 6.4 oz (111.8 kg)  01/07/18 237 lb (107.5 kg)    Physical Exam  Constitutional: He is oriented to person, place, and time. He appears well-developed and well-nourished. No distress.  Cardiovascular: Normal rate, regular rhythm and normal heart sounds. Exam reveals no friction rub.  No murmur heard. No lower extremity edema  Pulmonary/Chest: Effort normal and breath sounds normal. No respiratory distress. He has no wheezes. He has no rales.  Musculoskeletal:  There is pain with extension of back and flexion over 15 deg. Increased pain left SI with twist/extension bilat; worse with right twist. Less discomfort with tilting at waist.   There is reproducible pain with deep palpation left SI/sciatic course. There is not significant pain with hip flexion or internal/external hip rotaiton.   Neurological: He is alert and oriented to person, place, and time.  Reflex Scores:      Patellar  reflexes are 2+ on the right side and 2+ on the left side. Negative straight leg raise  Psychiatric: His behavior is normal. Cognition and memory are normal.    Assessment/Plan 1. Sciatica of left side Single steroid shot (avoiding anti-inflammatories due to bp medication (also patient preference) and long term steroids due to diabetes). Stretches, icing. Could be disc component, but since improving we can monitor and continue with support for sciatic/low back. Can also consider PT if not improving. - methylPREDNISolone acetate (DEPO-MEDROL) injection 80 mg  2. Need for influenza vaccination - Flu vaccine HIGH DOSE PF (Fluzone High  dose)   Return if symptoms worsen or fail to improve.  Reviewed exercises and stretches with him in office; reviewed those on patient instructions as well as others to help with hamstring, lower back flexibility.    Theodis Shove, MD

## 2018-07-01 NOTE — Patient Instructions (Signed)
Sciatica Sciatica is pain, numbness, weakness, or tingling along the path of the sciatic nerve. The sciatic nerve starts in the lower back and runs down the back of each leg. The nerve controls the muscles in the lower leg and in the back of the knee. It also provides feeling (sensation) to the back of the thigh, the lower leg, and the sole of the foot. Sciatica is a symptom of another medical condition that pinches or puts pressure on the sciatic nerve. Generally, sciatica only affects one side of the body. Sciatica usually goes away on its own or with treatment. In some cases, sciatica may keep coming back (recur). What are the causes? This condition is caused by pressure on the sciatic nerve, or pinching of the sciatic nerve. This may be the result of:  A disk in between the bones of the spine (vertebrae) bulging out too far (herniated disk).  Age-related changes in the spinal disks (degenerative disk disease).  A pain disorder that affects a muscle in the buttock (piriformis syndrome).  Extra bone growth (bone spur) near the sciatic nerve.  An injury or break (fracture) of the pelvis.  Pregnancy.  Tumor (rare).  What increases the risk? The following factors may make you more likely to develop this condition:  Playing sports that place pressure or stress on the spine, such as football or weight lifting.  Having poor strength and flexibility.  A history of back injury.  A history of back surgery.  Sitting for long periods of time.  Doing activities that involve repetitive bending or lifting.  Obesity.  What are the signs or symptoms? Symptoms can vary from mild to very severe, and they may include:  Any of these problems in the lower back, leg, hip, or buttock: ? Mild tingling or dull aches. ? Burning sensations. ? Sharp pains.  Numbness in the back of the calf or the sole of the foot.  Leg weakness.  Severe back pain that makes movement difficult.  These  symptoms may get worse when you cough, sneeze, or laugh, or when you sit or stand for long periods of time. Being overweight may also make symptoms worse. In some cases, symptoms may recur over time. How is this diagnosed? This condition may be diagnosed based on:  Your symptoms.  A physical exam. Your health care provider may ask you to do certain movements to check whether those movements trigger your symptoms.  You may have tests, including: ? Blood tests. ? X-rays. ? MRI. ? CT scan.  How is this treated? In many cases, this condition improves on its own, without any treatment. However, treatment may include:  Reducing or modifying physical activity during periods of pain.  Exercising and stretching to strengthen your abdomen and improve the flexibility of your spine.  Icing and applying heat to the affected area.  Medicines that help: ? To relieve pain and swelling. ? To relax your muscles.  Injections of medicines that help to relieve pain, irritation, and inflammation around the sciatic nerve (steroids).  Surgery.  Follow these instructions at home: Medicines  Take over-the-counter and prescription medicines only as told by your health care provider.  Do not drive or operate heavy machinery while taking prescription pain medicine. Managing pain  If directed, apply ice to the affected area. ? Put ice in a plastic bag. ? Place a towel between your skin and the bag. ? Leave the ice on for 20 minutes, 2-3 times a day.  After icing, apply   heat to the affected area before you exercise or as often as told by your health care provider. Use the heat source that your health care provider recommends, such as a moist heat pack or a heating pad. ? Place a towel between your skin and the heat source. ? Leave the heat on for 20-30 minutes. ? Remove the heat if your skin turns bright red. This is especially important if you are unable to feel pain, heat, or cold. You may have a  greater risk of getting burned. Activity  Return to your normal activities as told by your health care provider. Ask your health care provider what activities are safe for you. ? Avoid activities that make your symptoms worse.  Take brief periods of rest throughout the day. Resting in a lying or standing position is usually better than sitting to rest. ? When you rest for longer periods, mix in some mild activity or stretching between periods of rest. This will help to prevent stiffness and pain. ? Avoid sitting for long periods of time without moving. Get up and move around at least one time each hour.  Exercise and stretch regularly, as told by your health care provider.  Do not lift anything that is heavier than 10 lb (4.5 kg) while you have symptoms of sciatica. When you do not have symptoms, you should still avoid heavy lifting, especially repetitive heavy lifting.  When you lift objects, always use proper lifting technique, which includes: ? Bending your knees. ? Keeping the load close to your body. ? Avoiding twisting. General instructions  Use good posture. ? Avoid leaning forward while sitting. ? Avoid hunching over while standing.  Maintain a healthy weight. Excess weight puts extra stress on your back and makes it difficult to maintain good posture.  Wear supportive, comfortable shoes. Avoid wearing high heels.  Avoid sleeping on a mattress that is too soft or too hard. A mattress that is firm enough to support your back when you sleep may help to reduce your pain.  Keep all follow-up visits as told by your health care provider. This is important. Contact a health care provider if:  You have pain that wakes you up when you are sleeping.  You have pain that gets worse when you lie down.  Your pain is worse than you have experienced in the past.  Your pain lasts longer than 4 weeks.  You experience unexplained weight loss. Get help right away if:  You lose control  of your bowel or bladder (incontinence).  You have: ? Weakness in your lower back, pelvis, buttocks, or legs that gets worse. ? Redness or swelling of your back. ? A burning sensation when you urinate. This information is not intended to replace advice given to you by your health care provider. Make sure you discuss any questions you have with your health care provider. Document Released: 08/29/2001 Document Revised: 02/08/2016 Document Reviewed: 05/14/2015 Elsevier Interactive Patient Education  2018 [employer]  Sciatica Rehab Ask your health care provider which exercises are safe for you. Do exercises exactly as told by your health care provider and adjust them as directed. It is normal to feel mild stretching, pulling, tightness, or discomfort as you do these exercises, but you should stop right away if you feel sudden pain or your pain gets worse.Do not begin these exercises until told by your health care provider. Stretching and range of motion exercises These exercises warm up your muscles and joints and improve the movement   and flexibility of your hips and your back. These exercises also help to relieve pain, numbness, and tingling. Exercise A: Sciatic nerve glide 1. Sit in a chair with your head facing down toward your chest. Place your hands behind your back. Let your shoulders slump forward. 2. Slowly straighten one of your knees while you tilt your head back as if you are looking toward the ceiling. Only straighten your leg as far as you can without making your symptoms worse. 3. Hold for __________ seconds. 4. Slowly return to the starting position. 5. Repeat with your other leg. Repeat __________ times. Complete this exercise __________ times a day. Exercise B: Knee to chest with hip adduction and internal rotation  1. Lie on your back on a firm surface with both legs straight. 2. Bend one of your knees and move it up toward your chest until you feel a gentle stretch in your  lower back and buttock. Then, move your knee toward the shoulder that is on the opposite side from your leg. ? Hold your leg in this position by holding onto the front of your knee. 3. Hold for __________ seconds. 4. Slowly return to the starting position. 5. Repeat with your other leg. Repeat __________ times. Complete this exercise __________ times a day. Exercise C: Prone extension on elbows  1. Lie on your abdomen on a firm surface. A bed may be too soft for this exercise. 2. Prop yourself up on your elbows. 3. Use your arms to help lift your chest up until you feel a gentle stretch in your abdomen and your lower back. ? This will place some of your body weight on your elbows. If this is uncomfortable, try stacking pillows under your chest. ? Your hips should stay down, against the surface that you are lying on. Keep your hip and back muscles relaxed. 4. Hold for __________ seconds. 5. Slowly relax your upper body and return to the starting position. Repeat __________ times. Complete this exercise __________ times a day. Strengthening exercises These exercises build strength and endurance in your back. Endurance is the ability to use your muscles for a long time, even after they get tired. Exercise D: Pelvic tilt 1. Lie on your back on a firm surface. Bend your knees and keep your feet flat. 2. Tense your abdominal muscles. Tip your pelvis up toward the ceiling and flatten your lower back into the floor. ? To help with this exercise, you may place a small towel under your lower back and try to push your back into the towel. 3. Hold for __________ seconds. 4. Let your muscles relax completely before you repeat this exercise. Repeat __________ times. Complete this exercise __________ times a day. Exercise E: Alternating arm and leg raises  1. Get on your hands and knees on a firm surface. If you are on a hard floor, you may want to use padding to cushion your knees, such as an exercise  mat. 2. Line up your arms and legs. Your hands should be below your shoulders, and your knees should be below your hips. 3. Lift your left leg behind you. At the same time, raise your right arm and straighten it in front of you. ? Do not lift your leg higher than your hip. ? Do not lift your arm higher than your shoulder. ? Keep your abdominal and back muscles tight. ? Keep your hips facing the ground. ? Do not arch your back. ? Keep your balance carefully, and do not   hold your breath. 4. Hold for __________ seconds. 5. Slowly return to the starting position and repeat with your right leg and your left arm. Repeat __________ times. Complete this exercise __________ times a day. Posture and body mechanics  Body mechanics refers to the movements and positions of your body while you do your daily activities. Posture is part of body mechanics. Good posture and healthy body mechanics can help to relieve stress in your body's tissues and joints. Good posture means that your spine is in its natural S-curve position (your spine is neutral), your shoulders are pulled back slightly, and your head is not tipped forward. The following are general guidelines for applying improved posture and body mechanics to your everyday activities. Standing   When standing, keep your spine neutral and your feet about hip-width apart. Keep a slight bend in your knees. Your ears, shoulders, and hips should line up.  When you do a task in which you stand in one place for a long time, place one foot up on a stable object that is 2-4 inches (5-10 cm) high, such as a footstool. This helps keep your spine neutral. Sitting   When sitting, keep your spine neutral and keep your feet flat on the floor. Use a footrest, if necessary, and keep your thighs parallel to the floor. Avoid rounding your shoulders, and avoid tilting your head forward.  When working at a desk or a computer, keep your desk at a height where your hands are  slightly lower than your elbows. Slide your chair under your desk so you are close enough to maintain good posture.  When working at a computer, place your monitor at a height where you are looking straight ahead and you do not have to tilt your head forward or downward to look at the screen. Resting   When lying down and resting, avoid positions that are most painful for you.  If you have pain with activities such as sitting, bending, stooping, or squatting (flexion-based activities), lie in a position in which your body does not bend very much. For example, avoid curling up on your side with your arms and knees near your chest (fetal position).  If you have pain with activities such as standing for a long time or reaching with your arms (extension-based activities), lie with your spine in a neutral position and bend your knees slightly. Try the following positions: ? Lying on your side with a pillow between your knees. ? Lying on your back with a pillow under your knees. Lifting   When lifting objects, keep your feet at least shoulder-width apart and tighten your abdominal muscles.  Bend your knees and hips and keep your spine neutral. It is important to lift using the strength of your legs, not your back. Do not lock your knees straight out.  Always ask for help to lift heavy or awkward objects. This information is not intended to replace advice given to you by your health care provider. Make sure you discuss any questions you have with your health care provider. Document Released: 09/04/2005 Document Revised: 05/11/2016 Document Reviewed: 05/21/2015 Elsevier Interactive Patient Education  2018 [employer]  

## 2018-07-11 ENCOUNTER — Other Ambulatory Visit: Payer: Self-pay | Admitting: Internal Medicine

## 2018-07-11 DIAGNOSIS — I10 Essential (primary) hypertension: Secondary | ICD-10-CM

## 2018-08-11 ENCOUNTER — Other Ambulatory Visit: Payer: Self-pay | Admitting: Family Medicine

## 2018-08-14 ENCOUNTER — Other Ambulatory Visit: Payer: Self-pay | Admitting: Internal Medicine

## 2018-08-14 DIAGNOSIS — I10 Essential (primary) hypertension: Secondary | ICD-10-CM

## 2018-09-13 ENCOUNTER — Other Ambulatory Visit: Payer: Self-pay | Admitting: Internal Medicine

## 2018-09-13 DIAGNOSIS — I10 Essential (primary) hypertension: Secondary | ICD-10-CM

## 2018-09-29 ENCOUNTER — Other Ambulatory Visit: Payer: Self-pay | Admitting: Internal Medicine

## 2018-09-29 DIAGNOSIS — I10 Essential (primary) hypertension: Secondary | ICD-10-CM

## 2018-10-02 ENCOUNTER — Other Ambulatory Visit: Payer: Self-pay | Admitting: *Deleted

## 2018-10-02 DIAGNOSIS — I10 Essential (primary) hypertension: Secondary | ICD-10-CM

## 2018-10-02 MED ORDER — OLMESARTAN-AMLODIPINE-HCTZ 40-10-25 MG PO TABS: 1.0000 | ORAL_TABLET | Freq: Every day | ORAL | 0 refills | Status: DC

## 2018-10-13 ENCOUNTER — Other Ambulatory Visit: Payer: Self-pay | Admitting: Internal Medicine

## 2018-10-13 DIAGNOSIS — I10 Essential (primary) hypertension: Secondary | ICD-10-CM

## 2018-11-13 ENCOUNTER — Other Ambulatory Visit: Payer: Self-pay | Admitting: *Deleted

## 2018-11-13 DIAGNOSIS — I10 Essential (primary) hypertension: Secondary | ICD-10-CM

## 2018-11-13 MED ORDER — METFORMIN HCL ER 500 MG PO TB24: ORAL_TABLET | ORAL | 0 refills | Status: DC

## 2018-12-03 ENCOUNTER — Encounter: Payer: Commercial Managed Care - PPO | Admitting: Internal Medicine

## 2018-12-04 ENCOUNTER — Other Ambulatory Visit: Payer: Self-pay | Admitting: Internal Medicine

## 2018-12-04 DIAGNOSIS — I10 Essential (primary) hypertension: Secondary | ICD-10-CM

## 2018-12-13 ENCOUNTER — Other Ambulatory Visit: Payer: Self-pay | Admitting: Family Medicine

## 2018-12-13 DIAGNOSIS — I10 Essential (primary) hypertension: Secondary | ICD-10-CM

## 2018-12-16 ENCOUNTER — Other Ambulatory Visit: Payer: Self-pay | Admitting: Internal Medicine

## 2018-12-16 ENCOUNTER — Other Ambulatory Visit: Payer: Self-pay

## 2018-12-16 ENCOUNTER — Other Ambulatory Visit: Payer: Self-pay | Admitting: Family Medicine

## 2018-12-16 DIAGNOSIS — I10 Essential (primary) hypertension: Secondary | ICD-10-CM

## 2018-12-16 DIAGNOSIS — R972 Elevated prostate specific antigen [PSA]: Secondary | ICD-10-CM

## 2018-12-16 DIAGNOSIS — E876 Hypokalemia: Secondary | ICD-10-CM

## 2018-12-16 DIAGNOSIS — E785 Hyperlipidemia, unspecified: Secondary | ICD-10-CM

## 2018-12-16 DIAGNOSIS — E1169 Type 2 diabetes mellitus with other specified complication: Secondary | ICD-10-CM

## 2018-12-16 DIAGNOSIS — E119 Type 2 diabetes mellitus without complications: Secondary | ICD-10-CM

## 2018-12-16 MED ORDER — POTASSIUM CHLORIDE CRYS ER 20 MEQ PO TBCR: 20.0000 meq | EXTENDED_RELEASE_TABLET | Freq: Every day | ORAL | 0 refills | Status: DC

## 2018-12-16 MED ORDER — AMLODIPINE BESYLATE 10 MG PO TABS: ORAL_TABLET | ORAL | 0 refills | Status: DC

## 2018-12-16 NOTE — Telephone Encounter (Signed)
I had sent previous message to clarify med list so we are sending in appropriate med refills.   I do see that he has web visit in April; but I would like him to also have bloodwork before that time. I will order today. Please see if he will complete prior to appointment so we can discuss during visit.

## 2018-12-16 NOTE — Telephone Encounter (Signed)
OK for refill; but please clarify medication list. Looks like at one point he was on amlodipine combination - we need to make sure he isn't taking this medication in addition to a combo.

## 2018-12-16 NOTE — Telephone Encounter (Signed)
Potassium Last filled 08/12/2018 by Dr. Tawanna Cooler Last OV 07/01/18  Ok to fill?

## 2018-12-16 NOTE — Telephone Encounter (Signed)
Last fill 08/19/18 and 06/03/18. Neither have been filled bay you Last OV 07/01/18  Ok to fill?

## 2018-12-17 NOTE — Telephone Encounter (Signed)
Left message for patient to call back. CRM created 

## 2018-12-18 NOTE — Telephone Encounter (Signed)
Please see my message below with the approved rx to make sure checking over patient med list? Not sure why sent back to me without response?

## 2018-12-26 ENCOUNTER — Ambulatory Visit (INDEPENDENT_AMBULATORY_CARE_PROVIDER_SITE_OTHER): Payer: Commercial Managed Care - PPO | Admitting: Family Medicine

## 2018-12-26 ENCOUNTER — Other Ambulatory Visit: Payer: Self-pay

## 2018-12-26 ENCOUNTER — Ambulatory Visit: Payer: Self-pay | Admitting: *Deleted

## 2018-12-26 DIAGNOSIS — R195 Other fecal abnormalities: Secondary | ICD-10-CM

## 2018-12-26 NOTE — Telephone Encounter (Signed)
Virtual visit made with Dr. Caryl Never. Will send message to Dr. Caryl Never as Lorain Childes.

## 2018-12-26 NOTE — Progress Notes (Signed)
Patient ID: Javier [name], male   DOB: 03/01/1951, 68 y.o.   MRN: [id number]   Virtual Visit via Video Note  I connected with Javier [name] on 12/26/18 at  1:30 PM EDT by a video enabled telemedicine application and verified that I am speaking with the correct person using two identifiers.  Location patient: home Location provider:work or home office Persons participating in the virtual visit: patient, provider  I discussed the limitations of evaluation and management by telemedicine and the availability of in person appointments. The patient expressed understanding and agreed to proceed.   HPI: Patient complains of loose stools.  Started around Monday.  Wife had similar symptoms.  They thought this was something he had eaten.  No associated nausea or vomiting.  Initially was having very loose to watery stools but now semi-formed.  He had some old Imodium and thought that may have helped.  He drinks some milk last night which may have worsened and also had some salami last night.  He states that he had some sausage this morning.  Appetite is still fairly good.  No recent travels.  No recent antibiotics.  No fever.  Occasional abdominal cramps.  No bloody stools.   ROS: See pertinent positives and negatives per HPI.  Past Medical History:  Diagnosis Date  . BENIGN PROSTATIC HYPERTROPHY 02/20/2007  . Depression   . Diabetes mellitus   . ELEVATED PROSTATE SPECIFIC ANTIGEN 02/26/2009  . 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 FASCIITIS, PLANTAR 09/30/2007  . GERD 02/20/2007  . Heart murmur   . HYPERTENSION 02/20/2007  . HYPOKALEMIA 03/02/2009  . Obesity     Past Surgical History:  Procedure Laterality Date  . KNEE SURGERY     left    No family history on file.  SOCIAL HX: Non-smoker.  No regular alcohol use.   Current Outpatient Medications:  .  amLODipine (NORVASC) 10 MG tablet, TAKE 1 TABLET BY MOUTH EVERY DAY, Disp: 90 tablet, Rfl: 0 .  amLODipine (NORVASC) 10 MG tablet, TAKE 1  TABLET(10 MG) BY MOUTH DAILY, Disp: 90 tablet, Rfl: 0 .  atorvastatin (LIPITOR) 10 MG tablet, Take 1 tablet (10 mg total) by mouth daily. (Patient not taking: Reported on 07/01/2018), Disp: 90 tablet, Rfl: 3 .  metFORMIN (GLUCOPHAGE-XR) 500 MG 24 hr tablet, TAKE 1 TABLET BY MOUTH EVERY DAY WITH BREAKFAST, Disp: 90 tablet, Rfl: 0 .  metoprolol succinate (TOPROL-XL) 100 MG 24 hr tablet, take 1 tablet by mouth once daily (TAKE WITH OR IMMEDIATELY FOLLOWING A MEAL), Disp: 90 tablet, Rfl: 3 .  Olmesartan-amLODIPine-HCTZ 40-10-25 MG TABS, Take 1 tablet by mouth daily., Disp: 90 tablet, Rfl: 0 .  potassium chloride SA (K-DUR,KLOR-CON) 20 MEQ tablet, Take 1 tablet (20 mEq total) by mouth daily., Disp: 90 tablet, Rfl: 0 .  sildenafil (VIAGRA) 100 MG tablet, Take 0.5-1 tablets (50-100 mg total) by mouth daily as needed for erectile dysfunction., Disp: 5 tablet, Rfl: 11 .  tadalafil (CIALIS) 20 MG tablet, Take 1 tablet (20 mg total) by mouth daily. as directed, Disp: 12 tablet, Rfl: 6 .  tamsulosin (FLOMAX) 0.4 MG CAPS capsule, TAKE 1 CAPSULE(0.4 MG) BY MOUTH DAILY, Disp: 90 capsule, Rfl: 0  EXAM:  VITALS per patient if applicable:  GENERAL: alert, oriented, appears well and in no acute distress  HEENT: atraumatic, conjunttiva clear, no obvious abnormalities on inspection of external nose and ears  NECK: normal movements of the head and neck  LUNGS: on inspection no signs of respiratory distress, breathing  rate appears normal, no obvious gross SOB, gasping or wheezing  CV: no obvious cyanosis  MS: moves all visible extremities without noticeable abnormality  PSYCH/NEURO: pleasant and cooperative, no obvious depression or anxiety, speech and thought processing grossly intact  ASSESSMENT AND PLAN:  Discussed the following assessment and plan:  Loose stools.  Suspect viral. -Reviewed appropriate diet.  Especially avoid high fat and high sugar containing foods -He will try over-the-counter  Imodium -Follow-up for any persistent or worsening symptoms  Javier Covey MD [facility]

## 2018-12-26 NOTE — Telephone Encounter (Signed)
Pt reports "Loose stools" onset Tuesday. States 2/ daily. States normal pattern 1-2 daily. Concerned because stools are more loose  than normal. Denies fever, no abdominal pain, no blood or mucous in stools. States stool this AM had "Some form to it." States is staying hydrated. Reports "Been drinking milk lately." Also states may have eaten something "Not good" as wife had similar symptoms, since resolved. States took imodium "But it was 25-68 years old." Advised to not take any further doses of expired med. Home care advise given per protocol. Pt verbalizes understanding. Reason for Disposition . MILD-MODERATE diarrhea (e.g., 1-6 times / day more than normal)  Answer Assessment - Initial Assessment Questions 1. DIARRHEA SEVERITY: "How bad is the diarrhea?" "How many extra stools have you had in the past 24 hours than normal?"    - NO DIARRHEA (SCALE 0)   - MILD (SCALE 1-3): Few loose or mushy BMs; increase of 1-3 stools over normal daily number of stools; mild increase in ostomy output.   -  MODERATE (SCALE 4-7): Increase of 4-6 stools daily over normal; moderate increase in ostomy output. * SEVERE (SCALE 8-10; OR 'WORST POSSIBLE'): Increase of 7 or more stools daily over normal; moderate increase in ostomy output; incontinence.     mild 2. ONSET: "When did the diarrhea begin?"      Tuesday 3. BM CONSISTENCY: "How loose or watery is the diarrhea?"      "Some form this AM" Mostly loose other days 4. VOMITING: "Are you also vomiting?" If so, ask: "How many times in the past 24 hours?"      no 5. ABDOMINAL PAIN: "Are you having any abdominal pain?" If yes: "What does it feel like?" (e.g., crampy, dull, intermittent, constant)      no 6. ABDOMINAL PAIN SEVERITY: If present, ask: "How bad is the pain?"  (e.g., Scale 1-10; mild, moderate, or severe)   - MILD (1-3): doesn't interfere with normal activities, abdomen soft and not tender to touch    - MODERATE (4-7): interferes with normal activities or  awakens from sleep, tender to touch    - SEVERE (8-10): excruciating pain, doubled over, unable to do any normal activities       none 7. ORAL INTAKE: If vomiting, "Have you been able to drink liquids?" "How much fluids have you had in the past 24 hours?"     A lot 8. HYDRATION: "Any signs of dehydration?" (e.g., dry mouth [not just dry lips], too weak to stand, dizziness, new weight loss) "When did you last urinate?"     Yes 9. EXPOSURE: "Have you traveled to a foreign country recently?" "Have you been exposed to anyone with diarrhea?" "Could you have eaten any food that was spoiled?"     MAy have eaten something 10. ANTIBIOTIC USE: "Are you taking antibiotics now or have you taken antibiotics in the past 2 months?"       no 11. OTHER SYMPTOMS: "Do you have any other symptoms?" (e.g., fever, blood in stool)       None  Protocols used: DIARRHEA-A-AH

## 2019-01-03 ENCOUNTER — Telehealth: Payer: Self-pay

## 2019-01-03 NOTE — Telephone Encounter (Signed)
Called to make sure the patient is aware his appointment on 01/06/19 will be a virtual visit. Left message making patient aware.  CRM created.

## 2019-01-06 ENCOUNTER — Encounter: Payer: Self-pay | Admitting: Family Medicine

## 2019-01-06 ENCOUNTER — Telehealth: Payer: Self-pay

## 2019-01-06 ENCOUNTER — Other Ambulatory Visit: Payer: Self-pay

## 2019-01-06 ENCOUNTER — Ambulatory Visit (INDEPENDENT_AMBULATORY_CARE_PROVIDER_SITE_OTHER): Payer: Commercial Managed Care - PPO | Admitting: Family Medicine

## 2019-01-06 DIAGNOSIS — Z1211 Encounter for screening for malignant neoplasm of colon: Secondary | ICD-10-CM

## 2019-01-06 DIAGNOSIS — E119 Type 2 diabetes mellitus without complications: Secondary | ICD-10-CM | POA: Diagnosis not present

## 2019-01-06 DIAGNOSIS — E876 Hypokalemia: Secondary | ICD-10-CM

## 2019-01-06 DIAGNOSIS — N529 Male erectile dysfunction, unspecified: Secondary | ICD-10-CM

## 2019-01-06 DIAGNOSIS — N400 Enlarged prostate without lower urinary tract symptoms: Secondary | ICD-10-CM | POA: Diagnosis not present

## 2019-01-06 DIAGNOSIS — I10 Essential (primary) hypertension: Secondary | ICD-10-CM

## 2019-01-06 DIAGNOSIS — K219 Gastro-esophageal reflux disease without esophagitis: Secondary | ICD-10-CM | POA: Diagnosis not present

## 2019-01-06 DIAGNOSIS — F411 Generalized anxiety disorder: Secondary | ICD-10-CM

## 2019-01-06 NOTE — Telephone Encounter (Signed)
Called to make sure patient received the link for his virtual appointment and to verify chart info.  Left message for patient to call back. CRM created.

## 2019-01-06 NOTE — Progress Notes (Signed)
Virtual Visit via Video Note  I connected with@ on 01/06/19 at 12:30 PM EDT by a video enabled telemedicine application and verified that I am speaking with the correct person using two identifiers.  Location patient: home Location provider:work or home office Persons participating in the virtual visit: patient, provider  I discussed the limitations of evaluation and management by telemedicine and the availability of in person appointments. The patient expressed understanding and agreed to proceed.  Video feed would not connect properly, so visit was completed via telephone.  DAYON FEEHAN DOB: 20-Mar-1951 Encounter date: 01/06/2019  This is a 68 y.o. male who presents to establish care. No chief complaint on file.   History of present illness: No specific concerns today.   BFX:OVANVBT 10mg ,metoprolol 100mg  daily, olmesartan-amlodipine-hctz. Has only checked once 140/70. Blood pressure has been very hard to regulate in the past. Once amlodipine was added then his bp had improved control.   YOM:AYOKHT 0.4mg  daily Depression: XH:FSFSELTRV 500mg  Hypokalemia: taking potassium.   Takes the lipitor as preventative due to diabetes.   [facility] ([facility]rology) for BPH; they do follow his prostate numbers. Getting up 6 or so times to urinate in evening. Feels that late intake is contributing. Does have medication from urology to help with this but tries not to take it because he doesn't like to take medication. Med oxybutynin.   He has had sleep study through them.   He is behind with ophtho exam. He is in touch with them to schedule.    Past Medical History:  Diagnosis Date  . BENIGN PROSTATIC HYPERTROPHY 02/20/2007  . Depression   . Diabetes mellitus   . ELEVATED PROSTATE SPECIFIC ANTIGEN 02/26/2009  . 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 FASCIITIS, PLANTAR 09/30/2007  . GERD 02/20/2007  . Heart murmur   . HYPERTENSION 02/20/2007  . HYPOKALEMIA  03/02/2009  . Obesity    Past Surgical History:  Procedure Laterality Date  . KNEE SURGERY Bilateral    torn cartiledge   Allergies  Allergen Reactions  . Catapres [Clonidine Hcl]     Vivid dreams and loss of libido   No outpatient medications have been marked as taking for the 01/06/19 encounter (Office Visit) with Wynn Banker, MD.   Social History   Tobacco Use  . Smoking status: Never Smoker  . Smokeless tobacco: Never Used  Substance Use Topics  . Alcohol use: No   Family History  Problem Relation Age of Onset  . Heart failure Mother   . Other Father        no known med history  . High blood pressure Sister   . High blood pressure Sister      Review of Systems  Constitutional: Negative for chills, fatigue and fever.  Respiratory: Negative for cough, chest tightness, shortness of breath and wheezing.   Cardiovascular: Negative for chest pain, palpitations and leg swelling.    Objective:  There were no vitals taken for this visit.      BP Readings from Last 3 Encounters:  07/01/18 134/64  06/03/18 (!) 138/58  01/07/18 138/60   Wt Readings from Last 3 Encounters:  07/01/18 246 lb 9.6 oz (111.9 kg)  06/03/18 246 lb 6.4 oz (111.8 kg)  01/07/18 237 lb (107.5 kg)    EXAM:  GENERAL: alert, oriented, appears well and in no acute distress  PSYCH/NEURO: pleasant and cooperative, no obvious depression or anxiety, speech and thought processing grossly intact  Assessment/Plan  1. Essential hypertension This is been difficult to control in the past.  On record review it seems that he has been on 20 mg of amlodipine since at least 2013.  Cutting back on this dose was attempted numerous times, but blood pressure would spike.  He had a reaction to clonidine.  He was at one point on spironolactone, but I did not see why this was discontinued.  I do not see that he is ever been on hydralazine.  I think it would be reasonable to try to decrease his amlodipine  dose since it is above the maximum daily recommended dose.  I will have my medical assistant advise him to cut back on his amlodipine tablet to half daily (along with other regular medications so that he will total 15 mg of amlodipine daily) and check blood pressure on a daily basis.  We can further adjust medication or add on additional medications as needed pending his response.  I would consider hydralazine for blood pressure control given his other current medications.  He is due for blood work, which we can complete after pressure update above. - CBC with Differential/Platelet; Future - Comprehensive metabolic panel; Future  2. Gastroesophageal reflux disease without esophagitis Stable.  Diet controlled.  Not on any medications.  3. Diabetes mellitus without complication (HCC) Previously well controlled.  Will be due for blood work when able and when coronavirus risk has lessened. - Hemoglobin A1c; Future - Lipid panel; Future - Microalbumin / creatinine urine ratio; Future  4. Benign prostatic hyperplasia, unspecified whether lower urinary tract symptoms present [facility].  Continue Flomax.  Continue limiting evening fluids.  He states he is in discussion with him about starting additional medication for the prostate, and will have follow-up soon.  5. Generalized anxiety disorder Stable.  Not on medications currently.  6. ERECTILE DYSFUNCTION, ORGANIC Uses Viagra as needed.  7. HYPOKALEMIA Going issue.  Taking potassium supplement daily.  We will plan to recheck potassium levels with upcoming blood work.  8. Screening for colon cancer He has had difficulty with getting back in for colonoscopy and is asking to have Cologuard instead.  Previous colonoscopy was in 2006 and showed some subtle inflammation, but no obvious abnormality.  We will proceed with Cologuard. - Cologuard   Return for blood pressure recheck; update from home check 2 weeks.   I  discussed the assessment and treatment plan with the patient. The patient was provided an opportunity to ask questions and all were answered. The patient agreed with the plan and demonstrated an understanding of the instructions.   The patient was advised to call back or seek an in-person evaluation if the symptoms worsen or if the condition fails to improve as anticipated.  Javier [name] , MD

## 2019-01-08 ENCOUNTER — Other Ambulatory Visit: Payer: Self-pay | Admitting: Family Medicine

## 2019-01-08 ENCOUNTER — Telehealth: Payer: Self-pay

## 2019-01-08 DIAGNOSIS — I10 Essential (primary) hypertension: Secondary | ICD-10-CM

## 2019-01-08 MED ORDER — OLMESARTAN-AMLODIPINE-HCTZ 40-10-25 MG PO TABS: 1.0000 | ORAL_TABLET | Freq: Every day | ORAL | 0 refills | Status: DC

## 2019-01-08 NOTE — Telephone Encounter (Signed)
-----   Message from Wynn Banker, MD sent at 01/06/2019  1:11 PM EDT ----- cologuard ordered

## 2019-01-08 NOTE — Telephone Encounter (Signed)
Please see notes below - not sure if he was made aware of provider wish to decrease amlodipine with bp checking? I do not want to keep him on 20mg  of amlodipine total (see note below). If he is out of amlodipine entirely this would be good time for med change

## 2019-01-08 NOTE — Telephone Encounter (Signed)
Pt calling back to speak with a nurse. Pt would like a call back. Pt states that he has questions about getting medication sent to pharmacy .

## 2019-01-08 NOTE — Telephone Encounter (Signed)
Left message for patient to call back. CRM created 

## 2019-01-08 NOTE — Telephone Encounter (Signed)
Spoke with patient. He is aware of the notes per Dr. Hassan Rowan. He stated that he needs a refill on the amlodipine/bp med. This was last filled in January by Dr. Harrington Challenger to fill?

## 2019-01-08 NOTE — Telephone Encounter (Signed)
Yes patient is aware of the changes to his medication. From your note below it was my understanding that he was to stay on the amlodipine/bp and cut the plain amlodipine in half and update [place] in 2 weeks or sooner if needed. Is this correct?

## 2019-01-08 NOTE — Telephone Encounter (Signed)
Refill of combo med (olmesartan-amlodipine-hctz) sent in. Keep me posted on the blood pressures as previously directed.

## 2019-01-08 NOTE — Telephone Encounter (Signed)
-----   Message from Wynn Banker, MD sent at 01/06/2019  3:03 PM EDT ----- Sorry for multiple messages on him. Let him know I did review his chart. We discussed current med doses as he is on double max recommended amlodipine dose. I would like for [place] to work with this and get to a safer level. He has been on this for a long time, but there are reasons they set max med limits. I would like for him to start with breaking his extra amlodpine in half (so he takes 10mg  in the combo bp pill and an additional 10mg  extra daily). I would like for him to record a daily bp for 3 days prior to doing this; and then record daily for next 2 weeks and report back to me in 2 weeks time. If bp is climbing up over 140 regularly then please call sooner. I did notice a trend in past that when weight increases, his bp increases (this coincided with Dr. Kirtland Bouchard restarting the high amlodipine dose). Second, I had sent him doxy link and he couldn't get on. I think it would be helpful for him to know how to do this in future if you want to help him through this.

## 2019-01-08 NOTE — Telephone Encounter (Signed)
-----   Message from Wynn Banker, MD sent at 01/06/2019  1:08 PM EDT ----- He says he had pneumonia vaccine at [facility] which is now [facility] (the one listed in his chart); can you call and see what immunizations he has completed there and update chart.

## 2019-01-23 ENCOUNTER — Other Ambulatory Visit: Payer: Self-pay | Admitting: *Deleted

## 2019-01-23 DIAGNOSIS — I10 Essential (primary) hypertension: Secondary | ICD-10-CM

## 2019-01-23 NOTE — Telephone Encounter (Signed)
Last rx given by Dr Amador Cunas

## 2019-01-24 MED ORDER — METFORMIN HCL ER 500 MG PO TB24: ORAL_TABLET | ORAL | 0 refills | Status: DC

## 2019-01-27 LAB — HM DIABETES EYE EXAM

## 2019-03-23 ENCOUNTER — Other Ambulatory Visit: Payer: Self-pay | Admitting: Family Medicine

## 2019-03-23 DIAGNOSIS — I10 Essential (primary) hypertension: Secondary | ICD-10-CM

## 2019-04-20 ENCOUNTER — Other Ambulatory Visit: Payer: Self-pay | Admitting: Family Medicine

## 2019-04-22 ENCOUNTER — Other Ambulatory Visit: Payer: Self-pay | Admitting: Family Medicine

## 2019-04-22 DIAGNOSIS — I10 Essential (primary) hypertension: Secondary | ICD-10-CM

## 2019-04-28 ENCOUNTER — Ambulatory Visit: Payer: Commercial Managed Care - PPO | Admitting: Family Medicine

## 2019-05-01 ENCOUNTER — Encounter: Payer: Self-pay | Admitting: Family Medicine

## 2019-05-16 ENCOUNTER — Other Ambulatory Visit: Payer: Self-pay | Admitting: Family Medicine

## 2019-05-16 ENCOUNTER — Encounter: Payer: Self-pay | Admitting: Family Medicine

## 2019-05-16 ENCOUNTER — Telehealth: Payer: Self-pay | Admitting: *Deleted

## 2019-05-16 ENCOUNTER — Other Ambulatory Visit: Payer: Self-pay

## 2019-05-16 ENCOUNTER — Ambulatory Visit: Payer: Self-pay

## 2019-05-16 ENCOUNTER — Ambulatory Visit (HOSPITAL_COMMUNITY)
Admission: EM | Admit: 2019-05-16 | Discharge: 2019-05-16 | Disposition: A | Discharge status: Home / Self Care | Payer: Commercial Managed Care - PPO | Attending: Emergency Medicine | Admitting: Emergency Medicine

## 2019-05-16 ENCOUNTER — Encounter (HOSPITAL_COMMUNITY): Payer: Self-pay

## 2019-05-16 ENCOUNTER — Ambulatory Visit (INDEPENDENT_AMBULATORY_CARE_PROVIDER_SITE_OTHER): Payer: Commercial Managed Care - PPO | Admitting: Family Medicine

## 2019-05-16 VITALS — BP 160/70

## 2019-05-16 DIAGNOSIS — I10 Essential (primary) hypertension: Secondary | ICD-10-CM

## 2019-05-16 DIAGNOSIS — R140 Abdominal distension (gaseous): Secondary | ICD-10-CM | POA: Insufficient documentation

## 2019-05-16 DIAGNOSIS — E119 Type 2 diabetes mellitus without complications: Secondary | ICD-10-CM | POA: Diagnosis not present

## 2019-05-16 DIAGNOSIS — R1084 Generalized abdominal pain: Secondary | ICD-10-CM | POA: Insufficient documentation

## 2019-05-16 DIAGNOSIS — R531 Weakness: Secondary | ICD-10-CM

## 2019-05-16 DIAGNOSIS — R509 Fever, unspecified: Secondary | ICD-10-CM | POA: Diagnosis not present

## 2019-05-16 LAB — CBC WITH DIFFERENTIAL/PLATELET
Abs Immature Granulocytes: 0.01 10*3/uL (ref 0.00–0.07)
Basophils Absolute: 0 10*3/uL (ref 0.0–0.1)
Basophils Relative: 0 %
Eosinophils Absolute: 0 10*3/uL (ref 0.0–0.5)
Eosinophils Relative: 0 %
HCT: 44.8 % (ref 39.0–52.0)
Hemoglobin: 15.1 g/dL (ref 13.0–17.0)
Immature Granulocytes: 0 %
Lymphocytes Relative: 11 %
Lymphs Abs: 0.7 10*3/uL (ref 0.7–4.0)
MCH: 28.2 pg (ref 26.0–34.0)
MCHC: 33.7 g/dL (ref 30.0–36.0)
MCV: 83.6 fL (ref 80.0–100.0)
Monocytes Absolute: 0.5 10*3/uL (ref 0.1–1.0)
Monocytes Relative: 8 %
Neutro Abs: 5.4 10*3/uL (ref 1.7–7.7)
Neutrophils Relative %: 81 %
Platelets: 290 10*3/uL (ref 150–400)
RBC: 5.36 MIL/uL (ref 4.22–5.81)
RDW: 14.6 % (ref 11.5–15.5)
WBC: 6.6 10*3/uL (ref 4.0–10.5)
nRBC: 0 % (ref 0.0–0.2)

## 2019-05-16 LAB — COMPREHENSIVE METABOLIC PANEL
ALT: 27 U/L (ref 0–44)
AST: 26 U/L (ref 15–41)
Albumin: 3.4 g/dL — ABNORMAL LOW (ref 3.5–5.0)
Alkaline Phosphatase: 68 U/L (ref 38–126)
Anion gap: 13 (ref 5–15)
BUN: 12 mg/dL (ref 8–23)
CO2: 27 mmol/L (ref 22–32)
Calcium: 8.9 mg/dL (ref 8.9–10.3)
Chloride: 96 mmol/L — ABNORMAL LOW (ref 98–111)
Creatinine, Ser: 0.88 mg/dL (ref 0.61–1.24)
GFR calc Af Amer: >60 mL/min (ref >60)
GFR calc non Af Amer: >60 mL/min (ref >60)
Glucose, Bld: 174 mg/dL — ABNORMAL HIGH (ref 70–99)
Potassium: 3.2 mmol/L — ABNORMAL LOW (ref 3.5–5.1)
Sodium: 136 mmol/L (ref 135–145)
Total Bilirubin: 1.1 mg/dL (ref 0.3–1.2)
Total Protein: 7.7 g/dL (ref 6.5–8.1)

## 2019-05-16 LAB — POCT URINALYSIS DIP (DEVICE)
Bilirubin Urine: NEGATIVE
Glucose, UA: NEGATIVE mg/dL
Hgb urine dipstick: NEGATIVE
Ketones, ur: NEGATIVE mg/dL
Leukocytes,Ua: NEGATIVE
Nitrite: NEGATIVE
Protein, ur: NEGATIVE mg/dL
Specific Gravity, Urine: 1.025 (ref 1.005–1.030)
Urobilinogen, UA: 0.2 mg/dL (ref 0.0–1.0)
pH: 5.5 (ref 5.0–8.0)

## 2019-05-16 LAB — GLUCOSE, CAPILLARY: Glucose-Capillary: 179 mg/dL — ABNORMAL HIGH (ref 70–99)

## 2019-05-16 MED ORDER — HYDRALAZINE HCL 25 MG PO TABS: 25.0000 mg | ORAL_TABLET | Freq: Three times a day (TID) | ORAL | 1 refills | Status: DC

## 2019-05-16 NOTE — ED Triage Notes (Signed)
Pt presents to UC w/ c/o feeling bloated after eating dinner last night. Denies n/v, pt reports feeling weak. No diarrhea. Pt reports taking peptobismol at 1530 today which did provide some relief.

## 2019-05-16 NOTE — Telephone Encounter (Signed)
Patient informed of the message below.  Order completed and faxed to [facility] at [phone].  Lab and follow up visits were scheduled.

## 2019-05-16 NOTE — ED Provider Notes (Signed)
[facility]    CSN: [id number] Arrival date & time: 05/16/19  1550      History   Chief Complaint Chief Complaint  Patient presents with  . Bloated    HPI Javier [name] is a 68 y.o. male.   HPI Javier [name] is a 68 y.o. male presenting to UC with c/o feeling bloated after eating dinner last night, mild abdominal discomfort and cramping, generalized weakness and two episodes of a small amount of loose stool. He does report a temp of 100.2*F earlier today.  He took peptobismol at 1530 with moderate relief. Denies nausea or vomiting. Denies URI symptoms- no cough, congestion, chills, chest pain or SOB. No urinary symptoms. He does have diabetes but has not checked his sugar in a few days.    Past Medical History:  Diagnosis Date  . BENIGN PROSTATIC HYPERTROPHY 02/20/2007  . Depression   . Diabetes mellitus   . ELEVATED PROSTATE SPECIFIC ANTIGEN 02/26/2009  . 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 [place], [place] 09/30/2007  . GERD 02/20/2007  . Heart murmur   . HYPERTENSION 02/20/2007  . HYPOKALEMIA 03/02/2009  . Obesity     Patient Active Problem List   Diagnosis Date Noted  . Diabetes mellitus without complication ([place]) 07/12/8526  . Adverse effect of benzodiazepine 10/15/2011  . Anxiety disorder 06/02/2011  . 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 HYPOKALEMIA 03/02/2009  . ELEVATED PROSTATE SPECIFIC ANTIGEN 02/26/2009  . [place], [place] 09/30/2007  . Essential hypertension 02/20/2007  . GERD 02/20/2007  . BPH (benign prostatic hyperplasia) 02/20/2007    Past Surgical History:  Procedure Laterality Date  . KNEE SURGERY Bilateral    torn cartiledge       Home Medications    Prior to Admission medications   Medication Sig Start Date End Date Taking? Authorizing Provider  amlodipine-atorvastatin (CADUET) 10-10 MG tablet Take 0.5 tablets by mouth daily.   Yes [provider]  atorvastatin (LIPITOR) 10 MG tablet Take 1 tablet  (10 mg total) by mouth daily. 06/03/18   Marletta Lor, MD  hydrALAZINE (APRESOLINE) 25 MG tablet Take 1 tablet (25 mg total) by mouth 3 (three) times daily. 05/16/19   Koberlein, Steele Berg, MD  metFORMIN (GLUCOPHAGE-XR) 500 MG 24 hr tablet TAKE 1 TABLET BY MOUTH EVERY DAY WITH BREAKFAST 01/24/19   Koberlein, Andris Flurry C, MD  metoprolol succinate (TOPROL-XL) 100 MG 24 hr tablet take 1 tablet by mouth once daily (TAKE WITH OR IMMEDIATELY FOLLOWING A MEAL) 06/03/18   Marletta Lor, MD  Olmesartan-amLODIPine-HCTZ 40-10-25 MG TABS TAKE 1 TABLET BY MOUTH DAILY 03/27/19   Koberlein, Steele Berg, MD  potassium chloride SA (K-DUR) 20 MEQ tablet TAKE 1 TABLET(20 MEQ) BY MOUTH DAILY 04/22/19   Koberlein, Steele Berg, MD  sildenafil (VIAGRA) 100 MG tablet Take 0.5-1 tablets (50-100 mg total) by mouth daily as needed for erectile dysfunction. 02/28/16   Marletta Lor, MD  tamsulosin (FLOMAX) 0.4 MG CAPS capsule TAKE 1 CAPSULE(0.4 MG) BY MOUTH DAILY 04/22/19   Caren Macadam, MD    Family History Family History  Problem Relation Age of Onset  . Heart failure Mother   . Other Father        no known med history  . High blood pressure Sister   . High blood pressure Sister     Social History Social History   Tobacco Use  . Smoking status: Never Smoker  . Smokeless tobacco: Never Used  Substance Use Topics  .  Alcohol use: No  . Drug use: No     Allergies   Catapres [clonidine hcl]   Review of Systems Review of Systems  Constitutional: Positive for fever. Negative for chills, diaphoresis and fatigue.  HENT: Negative for congestion, ear pain, sore throat, trouble swallowing and voice change.   Respiratory: Negative for cough and shortness of breath.   Cardiovascular: Negative for chest pain and palpitations.  Gastrointestinal: Positive for abdominal pain, diarrhea (two episodes of small amount loose stool) and nausea. Negative for blood in stool, constipation and vomiting.   Musculoskeletal: Negative for arthralgias, back pain and myalgias.  Skin: Negative for rash.  Neurological: Positive for weakness (generalized ). Negative for dizziness, light-headedness and headaches.     Physical Exam Triage Vital Signs ED Triage Vitals  Enc Vitals Group     BP 05/16/19 1652 (!) 164/67     Pulse Rate 05/16/19 1652 76     Resp 05/16/19 1652 17     Temp 05/16/19 1652 98.7 F (37.1 C)     Temp Source 05/16/19 1652 Tympanic     SpO2 05/16/19 1652 98 %     Weight --      Height --      Head Circumference --      Peak Flow --      Pain Score 05/16/19 1702 0     Pain Loc --      Pain Edu? --      Excl. in GC? --    No data found.  Updated Vital Signs BP (!) 164/67 (BP Location: Left Arm)   Pulse 76   Temp 98.7 F (37.1 C) (Tympanic)   Resp 17   SpO2 98%   Visual Acuity Right Eye Distance:   Left Eye Distance:   Bilateral Distance:    Right Eye Near:   Left Eye Near:    Bilateral Near:     Physical Exam Vitals signs and nursing note reviewed.  Constitutional:      Appearance: Normal appearance. He is well-developed.  HENT:     Head: Normocephalic and atraumatic.     Mouth/Throat:     Mouth: Mucous membranes are moist.  Eyes:     General: No scleral icterus.    Conjunctiva/sclera: Conjunctivae normal.  Neck:     Musculoskeletal: Normal range of motion.  Cardiovascular:     Rate and Rhythm: Normal rate and regular rhythm.  Pulmonary:     Effort: Pulmonary effort is normal. No respiratory distress.     Breath sounds: Normal breath sounds. No stridor. No wheezing, rhonchi or rales.  Abdominal:     General: There is no distension.     Palpations: Abdomen is soft.     Tenderness: There is no abdominal tenderness. There is no right CVA tenderness or left CVA tenderness.  Musculoskeletal: Normal range of motion.  Skin:    General: Skin is warm and dry.     Capillary Refill: Capillary refill takes less than 2 seconds.     Coloration: Skin is  not jaundiced.  Neurological:     Mental Status: He is alert and oriented to person, place, and time.  Psychiatric:        Behavior: Behavior normal.      UC Treatments / Results  Labs (all labs ordered are listed, but only abnormal results are displayed) Labs Reviewed  COMPREHENSIVE METABOLIC PANEL - Abnormal; Notable for the following components:      Result Value   Potassium 3.2 (*)  Chloride 96 (*)    Glucose, Bld 174 (*)    Albumin 3.4 (*)    All other components within normal limits  GLUCOSE, CAPILLARY - Abnormal; Notable for the following components:   Glucose-Capillary 179 (*)    All other components within normal limits  CBC WITH DIFFERENTIAL/PLATELET  CBG MONITORING, ED  POCT URINALYSIS DIP (DEVICE)    EKG   Radiology No results found.  Procedures Procedures (including critical care time)  Medications Ordered in UC Medications - No data to display  Initial Impression / Assessment and Plan / UC Course  I have reviewed the triage vital signs and the nursing notes.  Pertinent labs & imaging results that were available during my care of the patient were reviewed by me and considered in my medical decision making (see chart for details).     Benign abdominal exam. No evidence of acute abdomen. UA: WNL CBC: WNL Pt safe for discharge home. He does note his symptoms have nearly resolved since taking the pepto bismol.  Just after discharge, CMP resulted, mild low potassium, otherwise unremarkable.   Encouraged pt to stick with bland diet until abdominal discomfort resolves F/u with PCP in 2-3 days if needed Discussed symptoms that warrant emergent care in the ED.  Final Clinical Impressions(s) / UC Diagnoses   Final diagnoses:  Abdominal bloating  Generalized abdominal cramping     Discharge Instructions      Be sure to get a lot of rest and stay well hydrated with sports drinks, water, diluted juices, and clear sodas.  Avoid fried fatty food,  spicy food, and milk as these foods can cause worsening stomach upset.   Please follow up with your family doctor in 2-3 days if not improving.  Call 911 or have someone drive you to the hospital if new or worsening symptoms develop- severe abdominal pain, pain localizing to Right lower side, fever, vomiting, or other new concerning symptoms develop.    ED Prescriptions    None     Controlled Substance Prescriptions [place] Controlled Substance Registry consulted? Not Applicable   Rolla Plate 05/16/19 3953

## 2019-05-16 NOTE — Telephone Encounter (Signed)
-----   Message from Caren Macadam, MD sent at 05/16/2019 10:08 AM EDT ----- #patient didn't hear about cologuard - please make sure he gets this set up#I spoke with pharmacist. They did recommend keeping amlodipine dose at 10mg  daily (which is in his combination tablet). So, I have sent in another blood pressure medication (hydralazine). We have to work on building up levels in system, so it will be 3 times daily for now and we will gradually increase dose. I would like for him to try and stop the additional amlodipine for now (when starting this hydralazine). #schedule for bloodwork when able#follow up visit in office for bp recheck in about 2 weeks (we can do flu shot then). Check bp at home in meanwhile. Let me know if pressures are still running over 595 systolic regularly

## 2019-05-16 NOTE — Telephone Encounter (Signed)
Incoming call from Patient and wife.  With a complaint of abdominal pain right above naval area.  Reports having a fever of 101.8 hand a Bmx2 two toay .Marland Kitchen no relief noted.   Pain comes and goes.  Sudden onset.  Early this morning. Rates this pain as moderate.  States he may have ate something spoiled.  Took one Pepto bismol capsule. Denies diarrhea.  Recommended that Pt go to Urgent Care      Reason for Disposition . [1] Fever > 101 F (38.3 C) AND [2] age > 29  Answer Assessment - Initial Assessment Questions 1. LOCATION: "Where does it hurt?"      Above a the navel  2. RADIATION: "Does the pain shoot anywhere else?" (e.g., chest, back)     no 3. ONSET: "When did the pain begin?" (Minutes, hours or days ago)      This morniong 4. SUDDEN: "Gradual or sudden onset?"     sudden 5. PATTERN "Does the pain come and go, or is it constant?"    - If constant: "Is it getting better, staying the same, or worsening?"      (Note: Constant means the pain never goes away completely; most serious pain is constant and it progresses)     - If intermittent: "How long does it last?" "Do you have pain now?"     (Note: Intermittent means the pain goes away completely between bouts)     Tightness is constant 6. SEVERITY: "How bad is the pain?"  (e.g., Scale 1-10; mild, moderate, or severe)    - MILD (1-3): doesn't interfere with normal activities, abdomen soft and not tender to touch     - MODERATE (4-7): interferes with normal activities or awakens from sleep, tender to touch     - SEVERE (8-10): excruciating pain, doubled over, unable to do any normal activities       Moderate 7. RECURRENT SYMPTOM: "Have you ever had this type of abdominal pain before?" If so, ask: "When was the last time?" and "What happened that time?"      denies 8. CAUSE: "What do you think is causing the abdominal pain?"     May have eaten  Eaten something spoiled  9. RELIEVING/AGGRAVATING FACTORS: "What makes it better or worse?"  (e.g., movement, antacids, bowel movement)     antacid10. OTHER SYMPTOMS: "Has there been any vomiting, diarrhea, constipation, or urine problems?"      denies  Protocols used: ABDOMINAL PAIN - MALE-A-AH

## 2019-05-16 NOTE — Discharge Instructions (Signed)
°  Be sure to get a lot of rest and stay well hydrated with sports drinks, water, diluted juices, and clear sodas.  Avoid fried fatty food, spicy food, and milk as these foods can cause worsening stomach upset.   Please follow up with your family doctor in 2-3 days if not improving.  Call 911 or have someone drive you to the hospital if new or worsening symptoms develop- severe abdominal pain, pain localizing to Right lower side, fever, vomiting, or other new concerning symptoms develop.

## 2019-05-16 NOTE — Progress Notes (Signed)
Virtual Visit via Video Note  I connected with Javier [name]  on 05/16/19 at  9:30 AM EDT by a video enabled telemedicine application and verified that I am speaking with the correct person using two identifiers.  Location patient: home Location provider:work or home office Persons participating in the virtual visit: patient, provider  I discussed the limitations of evaluation and management by telemedicine and the availability of in person appointments. The patient expressed understanding and agreed to proceed.   Javier [name] DOB: 10/04/1950 Encounter date: 05/16/2019  This is a 68 y.o. male who presents with Chief Complaint  Patient presents with  . Follow-up    History of present illness: Last visit was 12/2018. At that time we adjusted medications. He had been on amlodipine 20mg  daily; we decreased to 15mg .   Blood pressure in area of 153-156/70. Today was 160/70 with HR 63.   Thinks he has lost about 5lbs. Has been eating a little healthier. Little more active.    Allergies  Allergen Reactions  . Catapres [Clonidine Hcl]     Vivid dreams and loss of libido   Current Meds  Medication Sig  . atorvastatin (LIPITOR) 10 MG tablet Take 1 tablet (10 mg total) by mouth daily.  . metFORMIN (GLUCOPHAGE-XR) 500 MG 24 hr tablet TAKE 1 TABLET BY MOUTH EVERY DAY WITH BREAKFAST  . metoprolol succinate (TOPROL-XL) 100 MG 24 hr tablet take 1 tablet by mouth once daily (TAKE WITH OR IMMEDIATELY FOLLOWING A MEAL)  . Olmesartan-amLODIPine-HCTZ 40-10-25 MG TABS TAKE 1 TABLET BY MOUTH DAILY  . potassium chloride SA (K-DUR) 20 MEQ tablet TAKE 1 TABLET(20 MEQ) BY MOUTH DAILY  . sildenafil (VIAGRA) 100 MG tablet Take 0.5-1 tablets (50-100 mg total) by mouth daily as needed for erectile dysfunction.  . tamsulosin (FLOMAX) 0.4 MG CAPS capsule TAKE 1 CAPSULE(0.4 MG) BY MOUTH DAILY  . [DISCONTINUED] amLODipine (NORVASC) 10 MG tablet TAKE 1 TABLET BY MOUTH EVERY DAY    Review of Systems   Constitutional: Negative for chills, fatigue and fever.  Respiratory: Negative for cough, chest tightness, shortness of breath and wheezing.   Cardiovascular: Negative for chest pain, palpitations and leg swelling.    Objective:  BP (!) 160/70       BP Readings from Last 3 Encounters:  05/16/19 (!) 160/70  07/01/18 134/64  06/03/18 (!) 138/58   Wt Readings from Last 3 Encounters:  07/01/18 246 lb 9.6 oz (111.9 kg)  06/03/18 246 lb 6.4 oz (111.8 kg)  01/07/18 237 lb (107.5 kg)    EXAM:  GENERAL: alert, oriented, appears well and in no acute distress  HEENT: atraumatic, conjunctiva clear, no obvious abnormalities on inspection of external nose and ears  NECK: normal movements of the head and neck  LUNGS: on inspection no signs of respiratory distress, breathing rate appears normal, no obvious gross SOB, gasping or wheezing  CV: no obvious cyanosis  MS: moves all visible extremities without noticeable abnormality  PSYCH/NEURO: pleasant and cooperative, no obvious depression or anxiety, speech and thought processing grossly intact   Assessment/Plan 1. Essential hypertension Spoke with pharmacist at [facility]. They did recommend limiting amlodipine to 10mg  daily. We will start hydralazine 25mg  TID and increase as needed for bp control. Follow closely and recheck here in office I 2 weeks time. Set up bloodwork in meanwhile.   I have asked JoAnne to check on cologuard since he did not receive.      I discussed the assessment and treatment plan with  the patient. The patient was provided an opportunity to ask questions and all were answered. The patient agreed with the plan and demonstrated an understanding of the instructions.   The patient was advised to call back or seek an in-person evaluation if the symptoms worsen or if the condition fails to improve as anticipated.  I provided 15 minutes of non-face-to-face time during this encounter.   Theodis ShoveJunell Koberlein, MD

## 2019-05-22 ENCOUNTER — Other Ambulatory Visit: Payer: Self-pay | Admitting: Family Medicine

## 2019-05-22 DIAGNOSIS — I10 Essential (primary) hypertension: Secondary | ICD-10-CM

## 2019-05-23 ENCOUNTER — Other Ambulatory Visit: Payer: Commercial Managed Care - PPO

## 2019-06-02 ENCOUNTER — Encounter: Payer: Self-pay | Admitting: Family Medicine

## 2019-06-02 ENCOUNTER — Other Ambulatory Visit: Payer: Self-pay

## 2019-06-02 ENCOUNTER — Ambulatory Visit: Payer: Commercial Managed Care - PPO | Admitting: Family Medicine

## 2019-06-02 VITALS — BP 132/72 | HR 64 | Temp 99.1°F | Ht 67.5 in | Wt 247.9 lb

## 2019-06-02 DIAGNOSIS — R972 Elevated prostate specific antigen [PSA]: Secondary | ICD-10-CM | POA: Diagnosis not present

## 2019-06-02 DIAGNOSIS — E876 Hypokalemia: Secondary | ICD-10-CM | POA: Diagnosis not present

## 2019-06-02 DIAGNOSIS — I10 Essential (primary) hypertension: Secondary | ICD-10-CM | POA: Diagnosis not present

## 2019-06-02 DIAGNOSIS — N400 Enlarged prostate without lower urinary tract symptoms: Secondary | ICD-10-CM

## 2019-06-02 DIAGNOSIS — E1169 Type 2 diabetes mellitus with other specified complication: Secondary | ICD-10-CM

## 2019-06-02 DIAGNOSIS — E785 Hyperlipidemia, unspecified: Secondary | ICD-10-CM | POA: Diagnosis not present

## 2019-06-02 DIAGNOSIS — Z23 Encounter for immunization: Secondary | ICD-10-CM | POA: Diagnosis not present

## 2019-06-02 DIAGNOSIS — E119 Type 2 diabetes mellitus without complications: Secondary | ICD-10-CM

## 2019-06-02 LAB — CBC WITH DIFFERENTIAL/PLATELET
Basophils Absolute: 0 10*3/uL (ref 0.0–0.1)
Basophils Relative: 0.6 % (ref 0.0–3.0)
Eosinophils Absolute: 0.1 10*3/uL (ref 0.0–0.7)
Eosinophils Relative: 2.2 % (ref 0.0–5.0)
HCT: 40.5 % (ref 39.0–52.0)
Hemoglobin: 13.5 g/dL (ref 13.0–17.0)
Lymphocytes Relative: 32.1 % (ref 12.0–46.0)
Lymphs Abs: 1.4 10*3/uL (ref 0.7–4.0)
MCHC: 33.4 g/dL (ref 30.0–36.0)
MCV: 84.8 fl (ref 78.0–100.0)
Monocytes Absolute: 0.6 10*3/uL (ref 0.1–1.0)
Monocytes Relative: 14.1 % — ABNORMAL HIGH (ref 3.0–12.0)
Neutro Abs: 2.2 10*3/uL (ref 1.4–7.7)
Neutrophils Relative %: 51 % (ref 43.0–77.0)
Platelets: 257 10*3/uL (ref 150.0–400.0)
RBC: 4.77 Mil/uL (ref 4.22–5.81)
RDW: 15.2 % (ref 11.5–15.5)
WBC: 4.4 10*3/uL (ref 4.0–10.5)

## 2019-06-02 LAB — COMPREHENSIVE METABOLIC PANEL
ALT: 21 U/L (ref 0–53)
AST: 20 U/L (ref 0–37)
Albumin: 3.7 g/dL (ref 3.5–5.2)
Alkaline Phosphatase: 64 U/L (ref 39–117)
BUN: 17 mg/dL (ref 6–23)
CO2: 27 mEq/L (ref 19–32)
Calcium: 9 mg/dL (ref 8.4–10.5)
Chloride: 101 mEq/L (ref 96–112)
Creatinine, Ser: 0.73 mg/dL (ref 0.40–1.50)
GFR: 129.11 mL/min (ref >60.00)
Glucose, Bld: 104 mg/dL — ABNORMAL HIGH (ref 70–99)
Potassium: 3.6 mEq/L (ref 3.5–5.1)
Sodium: 137 mEq/L (ref 135–145)
Total Bilirubin: 0.7 mg/dL (ref 0.2–1.2)
Total Protein: 7 g/dL (ref 6.0–8.3)

## 2019-06-02 LAB — LIPID PANEL
Cholesterol: 150 mg/dL (ref 0–200)
HDL: 50.2 mg/dL (ref >39.00)
LDL Cholesterol: 86 mg/dL (ref 0–99)
NonHDL: 99.76
Total CHOL/HDL Ratio: 3
Triglycerides: 70 mg/dL (ref 0.0–149.0)
VLDL: 14 mg/dL (ref 0.0–40.0)

## 2019-06-02 LAB — MICROALBUMIN / CREATININE URINE RATIO
Creatinine,U: 79.1 mg/dL
Microalb Creat Ratio: 0.9 mg/g (ref 0.0–30.0)
Microalb, Ur: <0.7 mg/dL (ref 0.0–1.9)

## 2019-06-02 LAB — HEMOGLOBIN A1C: Hgb A1c MFr Bld: 6.2 % (ref 4.6–6.5)

## 2019-06-02 LAB — TSH: TSH: 1.27 u[IU]/mL (ref 0.35–4.50)

## 2019-06-02 LAB — PSA: PSA: 5.33 ng/mL — ABNORMAL HIGH (ref 0.10–4.00)

## 2019-06-02 MED ORDER — HYDRALAZINE HCL 25 MG PO TABS: 50.0000 mg | ORAL_TABLET | Freq: Three times a day (TID) | ORAL | 1 refills | Status: DC

## 2019-06-02 NOTE — Patient Instructions (Signed)
Increase the hydralazine to 50mg  three times daily (2 of the 25mg  tablets 3 times daily).

## 2019-06-02 NOTE — Progress Notes (Signed)
Javier [name] DOB: Jun 15, 1951 Encounter date: 06/02/2019  This is a 67 y.o. male who presents with Chief Complaint  Patient presents with  . Follow-up    History of present illness: At last visit we discussed amlodipine dosing again. After discussion with pharmacist for safety, they recommended max dose be at 10mg  although he had been on 20mg  for some time (with good bp control). We initially decreased to 15mg  amlodipine but he still had bp elevations 150-160/70's. We then added hydralazine 25mg  TID. Continued the olmesartan-hctz-amlodipine. And metoprolol. Still getting in 150-170 range at home with similar diastolic. Checks at different times in day.   ?heard about cologuard: is going to check at rent office.   bloodwork from urgent care visit 8/28 showed: hypokalemia, hypochloremia, elevated glucose, low protein. Wasn't feeling well - bloated. Thinks something he ate. Bloating is gone.   Headache has also gone away.  DMI: metformin 500mg  daily with breakfast HL: lipitor 10mg  daily BPH: on flomax (takes in evening), [facility] Not sure when last time they checked.   Usually brings home food in evening; this tends to throw him off and keep him from exercising regularly.    Allergies  Allergen Reactions  . Catapres [Clonidine Hcl]     Vivid dreams and loss of libido   Current Meds  Medication Sig  . atorvastatin (LIPITOR) 10 MG tablet Take 1 tablet (10 mg total) by mouth daily.  . hydrALAZINE (APRESOLINE) 25 MG tablet Take 2 tablets (50 mg total) by mouth 3 (three) times daily.  . metFORMIN (GLUCOPHAGE-XR) 500 MG 24 hr tablet TAKE 1 TABLET BY MOUTH EVERY DAY WITH BREAKFAST  . metoprolol succinate (TOPROL-XL) 100 MG 24 hr tablet take 1 tablet by mouth once daily (TAKE WITH OR IMMEDIATELY FOLLOWING A MEAL)  . Olmesartan-amLODIPine-HCTZ 40-10-25 MG TABS TAKE 1 TABLET BY MOUTH DAILY  . potassium chloride SA (K-DUR) 20 MEQ tablet TAKE 1 TABLET(20 MEQ) BY MOUTH DAILY  .  sildenafil (VIAGRA) 100 MG tablet Take 0.5-1 tablets (50-100 mg total) by mouth daily as needed for erectile dysfunction.  . tamsulosin (FLOMAX) 0.4 MG CAPS capsule TAKE 1 CAPSULE(0.4 MG) BY MOUTH DAILY  . [DISCONTINUED] amlodipine-atorvastatin (CADUET) 10-10 MG tablet Take 0.5 tablets by mouth daily.  . [DISCONTINUED] hydrALAZINE (APRESOLINE) 25 MG tablet TAKE 1 TABLET(25 MG) BY MOUTH THREE TIMES DAILY    Review of Systems  Constitutional: Negative for chills, fatigue and fever.  Respiratory: Negative for cough, chest tightness, shortness of breath and wheezing.   Cardiovascular: Negative for chest pain, palpitations and leg swelling.  Neurological: Positive for headaches (slight; thinks improving. ).    Objective:  BP 132/72 (BP Location: Left Arm, Patient Position: Sitting, Cuff Size: Large)   Pulse 64   Temp 99.1 F (37.3 C) (Temporal)   Ht 5' 7.5" (1.715 m)   Wt 247 lb 14.4 oz (112.4 kg)   SpO2 98%   BMI 38.25 kg/m   Weight: 247 lb 14.4 oz (112.4 kg)   BP Readings from Last 3 Encounters:  06/02/19 132/72  05/16/19 (!) 164/67  05/16/19 (!) 160/70   Wt Readings from Last 3 Encounters:  06/02/19 247 lb 14.4 oz (112.4 kg)  07/01/18 246 lb 9.6 oz (111.9 kg)  06/03/18 246 lb 6.4 oz (111.8 kg)    Physical Exam Constitutional:      General: He is not in acute distress.    Appearance: He is well-developed.  HENT:     Head: Normocephalic and atraumatic.  Cardiovascular:  Rate and Rhythm: Normal rate and regular rhythm.     Heart sounds: Normal heart sounds. No murmur.  Pulmonary:     Effort: Pulmonary effort is normal.     Breath sounds: Normal breath sounds.  Abdominal:     General: Bowel sounds are normal. There is no distension.     Palpations: Abdomen is soft.     Tenderness: There is no abdominal tenderness. There is no guarding.  Skin:    General: Skin is warm and dry.     Comments: Sensory exam of the foot is normal, tested with the monofilament. Good  pulses, no lesions or ulcers, good peripheral pulses.  Psychiatric:        Judgment: Judgment normal.     Assessment/Plan  1. Essential hypertension suboptimal control. Increase hydralazine to 50mg  TID. Continue other medications.  - CBC with Differential/Platelet; Future - Comprehensive metabolic panel; Future  3. Diabetes mellitus without complication (HCC) Continue with metformin  - Hemoglobin A1c; Future - Microalbumin / creatinine urine ratio; Future - HM DIABETES FOOT EXAM; Future  4. Benign prostatic hyperplasia, unspecified whether lower urinary tract symptoms present  - PSA; Future  5. HYPOKALEMIA Continue with potassium supplementation.  6. ELEVATED PROSTATE SPECIFIC ANTIGEN  - PSA; Future  7. Hyperlipidemia associated with type 2 diabetes mellitus (HCC)  - Lipid panel; Future - TSH; Future  Return in about 1 month (around 07/02/2019), or blood pressure follow up.      Theodis ShoveJunell Coley Littles, MD

## 2019-06-25 ENCOUNTER — Other Ambulatory Visit: Payer: Self-pay | Admitting: Family Medicine

## 2019-06-25 DIAGNOSIS — I10 Essential (primary) hypertension: Secondary | ICD-10-CM

## 2019-06-30 ENCOUNTER — Telehealth: Payer: Self-pay | Admitting: Family Medicine

## 2019-06-30 DIAGNOSIS — I10 Essential (primary) hypertension: Secondary | ICD-10-CM

## 2019-07-01 ENCOUNTER — Telehealth: Payer: Self-pay | Admitting: *Deleted

## 2019-07-01 DIAGNOSIS — I10 Essential (primary) hypertension: Secondary | ICD-10-CM

## 2019-07-01 MED ORDER — HYDRALAZINE HCL 25 MG PO TABS: ORAL_TABLET | ORAL | 3 refills | Status: DC

## 2019-07-01 NOTE — Telephone Encounter (Signed)
Pt is taking 2 tablets by mouth 3 times a day. Pt needs more tablets

## 2019-07-01 NOTE — Telephone Encounter (Signed)
Rx done. 

## 2019-07-01 NOTE — Addendum Note (Signed)
Addended by: Agnes Lawrence on: 07/01/2019 01:13 PM   Modules accepted: Orders

## 2019-07-01 NOTE — Telephone Encounter (Signed)
[facility] faxed a refill request for Atorvastatin 10mg -take 1 tablet by mouth daily-#90 and also for Metoprolol ER Succinate 100mg -take 1 tablet by mouth once daily with or immediately following a meal-#90.  Message sent to Dr Ethlyn Gallery.

## 2019-07-02 MED ORDER — METOPROLOL SUCCINATE ER 100 MG PO TB24: ORAL_TABLET | ORAL | 1 refills | Status: DC

## 2019-07-02 MED ORDER — ATORVASTATIN CALCIUM 10 MG PO TABS: 10.0000 mg | ORAL_TABLET | Freq: Every day | ORAL | 1 refills | Status: DC

## 2019-07-02 NOTE — Telephone Encounter (Signed)
[facility] for all refills.

## 2019-07-02 NOTE — Telephone Encounter (Signed)
Rx done. 

## 2019-07-04 ENCOUNTER — Encounter: Payer: Self-pay | Admitting: Family Medicine

## 2019-07-04 ENCOUNTER — Other Ambulatory Visit: Payer: Self-pay

## 2019-07-04 ENCOUNTER — Ambulatory Visit: Payer: Commercial Managed Care - PPO | Admitting: Family Medicine

## 2019-07-04 VITALS — BP 180/80 | HR 68 | Temp 98.2°F | Wt 249.0 lb

## 2019-07-04 DIAGNOSIS — E876 Hypokalemia: Secondary | ICD-10-CM

## 2019-07-04 DIAGNOSIS — I10 Essential (primary) hypertension: Secondary | ICD-10-CM | POA: Diagnosis not present

## 2019-07-04 DIAGNOSIS — G8929 Other chronic pain: Secondary | ICD-10-CM | POA: Diagnosis not present

## 2019-07-04 DIAGNOSIS — M25562 Pain in left knee: Secondary | ICD-10-CM

## 2019-07-04 DIAGNOSIS — M25561 Pain in right knee: Secondary | ICD-10-CM

## 2019-07-04 MED ORDER — SPIRONOLACTONE 25 MG PO TABS: 25.0000 mg | ORAL_TABLET | Freq: Every day | ORAL | 1 refills | Status: DC

## 2019-07-04 MED ORDER — TRAMADOL HCL 50 MG PO TABS: 50.0000 mg | ORAL_TABLET | Freq: Every day | ORAL | 0 refills | Status: DC | PRN

## 2019-07-04 NOTE — Progress Notes (Signed)
CASH DUCE DOB: Jan 13, 1951 Encounter date: 07/04/2019  This is a 68 y.o. male who presents with No chief complaint on file.   History of present illness: At last visit we increased hydralazine to 50mg  TiD, continued metoprolol 100mg  daily, olmesartan-amlodipine-hctz 40-10-25.  Has always had hard to manage blood pressure. His home cuff was 173/70's. At home he hasn't been checking on daily basis but getting readings close to 170-180/80.    Sometimes feeling a little light headed - notes more with the hydralazine. Doesn't feel like this is improving at all for him.   Noted yesterday at work felt good in morning, but then in afternoon got headache, started feeling light headed. Not in relation to taking medication. Takes in morning, then 2pm, then at bedtime.   Knee pain bilat. Left knee flares worse. Has seen ortho in past. Injections seemed to stop working. Does feel like left knee swells sometimes. Hurts after working and standing all day. Has taken tylenol arthritis with some relief. Would like to put off knee replacement, but was told that he would likely need on in future.    Allergies  Allergen Reactions  . Catapres [Clonidine Hcl]     Vivid dreams and loss of libido   Current Meds  Medication Sig  . atorvastatin (LIPITOR) 10 MG tablet Take 1 tablet (10 mg total) by mouth daily.  . hydrALAZINE (APRESOLINE) 25 MG tablet Take 2 tablets by mouth three times a day  . metFORMIN (GLUCOPHAGE-XR) 500 MG 24 hr tablet TAKE 1 TABLET BY MOUTH EVERY DAY WITH BREAKFAST  . metoprolol succinate (TOPROL-XL) 100 MG 24 hr tablet take 1 tablet by mouth once daily (TAKE WITH OR IMMEDIATELY FOLLOWING A MEAL)  . Olmesartan-amLODIPine-HCTZ 40-10-25 MG TABS TAKE 1 TABLET BY MOUTH DAILY  . potassium chloride SA (K-DUR) 20 MEQ tablet TAKE 1 TABLET(20 MEQ) BY MOUTH DAILY  . sildenafil (VIAGRA) 100 MG tablet Take 0.5-1 tablets (50-100 mg total) by mouth daily as needed for erectile dysfunction.  .  tamsulosin (FLOMAX) 0.4 MG CAPS capsule TAKE 1 CAPSULE(0.4 MG) BY MOUTH DAILY    Review of Systems  Constitutional: Negative for chills, fatigue and fever.  Respiratory: Negative for cough, chest tightness (felt like he had tightness with increase in hydralazine. Notes only when taking medication; doesn't have chest tightness on exertion or between doses. ), shortness of breath and wheezing.   Cardiovascular: Negative for chest pain, palpitations and leg swelling.  Musculoskeletal: Positive for arthralgias (chronic knee pain; left knee is getting worse. Pain can be excrutiating by end of work shift.).  Neurological: Positive for dizziness, light-headedness and headaches.    Objective:  BP (!) 180/80 (BP Location: Left Arm, Patient Position: Sitting, Cuff Size: Normal)   Pulse 68   Temp 98.2 F (36.8 C) (Temporal)   Wt 249 lb (112.9 kg)   SpO2 96%   BMI 38.42 kg/m   Weight: 249 lb (112.9 kg)   BP Readings from Last 3 Encounters:  07/04/19 (!) 180/80  06/02/19 132/72  05/16/19 (!) 164/67   Wt Readings from Last 3 Encounters:  07/04/19 249 lb (112.9 kg)  06/02/19 247 lb 14.4 oz (112.4 kg)  07/01/18 246 lb 9.6 oz (111.9 kg)    Physical Exam Constitutional:      General: He is not in acute distress.    Appearance: He is well-developed.  Cardiovascular:     Rate and Rhythm: Normal rate and regular rhythm.     Heart sounds: Normal heart sounds. No  murmur. No friction rub.  Pulmonary:     Effort: Pulmonary effort is normal. No respiratory distress.     Breath sounds: Normal breath sounds. No wheezing or rales.  Musculoskeletal:     Right lower leg: No edema.     Left lower leg: No edema.  Neurological:     Mental Status: He is alert and oriented to person, place, and time.  Psychiatric:        Behavior: Behavior normal.     Assessment/Plan  1. Chronic pain of both knees He is going to try topical diclofenac gel which we discussed that he can get over-the-counter.  For  extreme pain after work shifts occasional use of tramadol was discussed today.  He can continue to use Tylenol arthritis for benefit of pain as well.  We will try to avoid anti-inflammatories due to potential increase of blood pressure. - traMADol (ULTRAM) 50 MG tablet; Take 1 tablet (50 mg total) by mouth daily as needed for severe pain.  Dispense: 30 tablet; Refill: 0  2. Uncontrolled hypertension He has always had uncontrolled blood pressure or difficult to control blood pressure.  He has been taking olmesartan amlodipine hydrochlorothiazide combination for years as well as metoprolol.  We tried to increase his hydralazine to 50 mg 3 times a day at the last visit, but he did not tolerate this well and blood pressure has been still elevated.  I am going to refer to cardiology for further advice and medication dosing.  I have asked him to cut back his hydralazine to 25 mg 3 times a day, which she seemed to tolerate better.  We are also going to add on spironolactone 25 mg daily.  He has had issues with hypokalemia in the past and is on potassium supplements, but we discussed that potassium levels may raise with the spironolactone, so we will monitor carefully and adjust supplementation appropriately.  BMP today to see where we are starting with potassium levels.  I encouraged him to continue to check his blood pressures at home.  If blood pressure is higher than in the office today, he needs to call us.  If having any chest pain chest pressure that he needs to be evaluated. - spironolactone (ALDACTONE) 25 MG tablet; Take 1 tablet (25 mg total) by mouth daily.  Dispense: 90 tablet; Refill: 1 - Ambulatory referral to Cardiology  3. Hypokalemia See above - Basic metabolic panel; Future - Basic metabolic panel    Return for pending blood pressure.     Micheline Rough, MD

## 2019-07-04 NOTE — Patient Instructions (Addendum)
voltaren gel over the counter.  Decrease hydralazine to 1 tablet (25mg ) three times daily Start spironolactone daily.   We may hold your potassium supplement, but I will be in touch next week once I get your bloodwork results back.

## 2019-07-05 LAB — BASIC METABOLIC PANEL
BUN: 15 mg/dL (ref 7–25)
CO2: 26 mmol/L (ref 20–32)
Calcium: 8.8 mg/dL (ref 8.6–10.3)
Chloride: 101 mmol/L (ref 98–110)
Creat: 0.71 mg/dL (ref 0.70–1.25)
Glucose, Bld: 98 mg/dL (ref 65–99)
Potassium: 3.8 mmol/L (ref 3.5–5.3)
Sodium: 139 mmol/L (ref 135–146)

## 2019-07-06 MED ORDER — POTASSIUM CHLORIDE CRYS ER 20 MEQ PO TBCR: 20.0000 meq | EXTENDED_RELEASE_TABLET | ORAL | 1 refills | Status: DC

## 2019-07-16 ENCOUNTER — Other Ambulatory Visit: Payer: Self-pay | Admitting: Family Medicine

## 2019-07-16 ENCOUNTER — Telehealth: Payer: Self-pay | Admitting: Family Medicine

## 2019-07-16 MED ORDER — AMLODIPINE BESYLATE 10 MG PO TABS: 5.0000 mg | ORAL_TABLET | Freq: Every day | ORAL | 1 refills | Status: DC

## 2019-07-16 NOTE — Telephone Encounter (Signed)
Pt called in to ask if provider would switch him back to his old medication? Olmesartan-Amlodipine-HCTZ (TRIBENZOR) 40-10-25 MG TABS. Pt says that he has an apt to see cardiologist but until then he'd rather be back on his old medication, pt feels that his new mediation isn't helping.   CB: [phone]

## 2019-07-16 NOTE — Telephone Encounter (Signed)
He should still be taking the tribenzor. I just sent in refill 10/7. New medication was add on to this because he was not well controlled. Please clarify current medication list with him since it seems we are not on the same page.

## 2019-07-16 NOTE — Telephone Encounter (Signed)
I called the pt and informed him of the message below.  Patient stated the previous message was incorrect, he wanted to know if he can switch back to the plain Amlodipine in addition to the combo pill?  Message sent to Dr Ethlyn Gallery.

## 2019-07-16 NOTE — Telephone Encounter (Signed)
See results note. 

## 2019-07-16 NOTE — Telephone Encounter (Signed)
Spoke with patient - he is taking all meds as prescribed. Feels like bp has bee nin the 209'O-709G systolic but just feels tired in afternoon. Didn't feel like this with other meds. He was better controlled with additional amlodipine in past; we had taken off since he was over max recommended dose (he was taking 20mg  daily). He had difficulty with higher hydralazine dose. We recently added spironolactone which lowered numbers, but he still has fatigue.   I am ok with adding back in half tab of the amlodipine (5mg ) while we await cardiology appointment. He is going to let me know next week how he does with this. He was stable for years with this higher dose of amlodipine. Continue other meds for now.

## 2019-07-25 LAB — COLOGUARD: Cologuard: NEGATIVE

## 2019-08-01 ENCOUNTER — Other Ambulatory Visit (INDEPENDENT_AMBULATORY_CARE_PROVIDER_SITE_OTHER): Payer: Commercial Managed Care - PPO

## 2019-08-01 ENCOUNTER — Other Ambulatory Visit: Payer: Self-pay

## 2019-08-01 ENCOUNTER — Other Ambulatory Visit: Payer: Commercial Managed Care - PPO

## 2019-08-01 DIAGNOSIS — R7989 Other specified abnormal findings of blood chemistry: Secondary | ICD-10-CM

## 2019-08-01 LAB — BASIC METABOLIC PANEL
BUN: 20 mg/dL (ref 6–23)
CO2: 30 mEq/L (ref 19–32)
Calcium: 9 mg/dL (ref 8.4–10.5)
Chloride: 103 mEq/L (ref 96–112)
Creatinine, Ser: 0.97 mg/dL (ref 0.40–1.50)
GFR: 92.96 mL/min (ref >60.00)
Glucose, Bld: 108 mg/dL — ABNORMAL HIGH (ref 70–99)
Potassium: 4 mEq/L (ref 3.5–5.1)
Sodium: 140 mEq/L (ref 135–145)

## 2019-08-01 NOTE — Addendum Note (Signed)
Addended by: Agnes Lawrence on: 08/01/2019 01:08 PM   Modules accepted: Orders

## 2019-08-07 ENCOUNTER — Encounter: Payer: Self-pay | Admitting: Family Medicine

## 2019-08-08 ENCOUNTER — Ambulatory Visit (INDEPENDENT_AMBULATORY_CARE_PROVIDER_SITE_OTHER): Payer: Commercial Managed Care - PPO | Admitting: Internal Medicine

## 2019-08-08 ENCOUNTER — Encounter: Payer: Self-pay | Admitting: Internal Medicine

## 2019-08-08 ENCOUNTER — Other Ambulatory Visit: Payer: Self-pay

## 2019-08-08 VITALS — BP 158/70 | HR 67 | Ht 67.5 in | Wt 242.8 lb

## 2019-08-08 DIAGNOSIS — R0683 Snoring: Secondary | ICD-10-CM | POA: Diagnosis not present

## 2019-08-08 DIAGNOSIS — I10 Essential (primary) hypertension: Secondary | ICD-10-CM | POA: Diagnosis not present

## 2019-08-08 MED ORDER — HYDRALAZINE HCL 50 MG PO TABS: 50.0000 mg | ORAL_TABLET | Freq: Three times a day (TID) | ORAL | 3 refills | Status: DC

## 2019-08-08 NOTE — Patient Instructions (Signed)
Medication Instructions:  Your physician has recommended you make the following change in your medication:  1.) stop amlodipine 2.) increase hydralazine to 50 mg three times a day  *If you need a refill on your cardiac medications before your next appointment, please call your pharmacy*  Lab Work: none If you have labs (blood work) drawn today and your tests are completely normal, you will receive your results only by: Marland Kitchen MyChart Message (if you have MyChart) OR . A paper copy in the mail If you have any lab test that is abnormal or we need to change your treatment, we will call you to review the results.  Testing/Procedures: Your physician has requested that you have an echocardiogram. Echocardiography is a painless test that uses sound waves to create images of your heart. It provides your doctor with information about the size and shape of your heart and how well your heart's chambers and valves are working. This procedure takes approximately one hour. There are no restrictions for this procedure.  Your physician has requested that you have a renal artery duplex. During this test, an ultrasound is used to evaluate blood flow to the kidneys. Allow one hour for this exam. Do not eat after midnight the day before and avoid carbonated beverages. Take your medications as you usually do.  Your physician has recommended that you have a sleep study. This test records several body functions during sleep, including: brain activity, eye movement, oxygen and carbon dioxide blood levels, heart rate and rhythm, breathing rate and rhythm, the flow of air through your mouth and nose, snoring, body muscle movements, and chest and belly movement.   Follow-Up: Follow up with your physician will depend on test results.   Other Instructions none

## 2019-08-08 NOTE — Progress Notes (Signed)
Cardiology Office Note   Date:  08/08/2019   ID:  Duglas, Heier 1951/08/28, MRN [id number]  PCP:  Wynn Banker, MD  Cardiologist:   Dietrich Pates, MD   Pt referred by Dr Hassan Rowan for HTN    History of Present Illness: Javier [name] is a 68 y.o. male with a long history of hypertension.  He was referred by Dr. Hassan Rowan for evaluation and treatment.  The patient was diagnosed with hypertension back in the 1970s.  He has been on some medical therapy since.  He was recently seen by Dr. Hassan Rowan  when and medications were adjusted.  He denies chest tightness notes occasional shortness of breath.  No lower extremity swelling.       Current Meds  Medication Sig  . amLODipine (NORVASC) 10 MG tablet Take 0.5-1 tablets (5-10 mg total) by mouth daily.  Marland Kitchen atorvastatin (LIPITOR) 10 MG tablet Take 1 tablet (10 mg total) by mouth daily.  . hydrALAZINE (APRESOLINE) 25 MG tablet Take 2 tablets by mouth three times a day  . metFORMIN (GLUCOPHAGE-XR) 500 MG 24 hr tablet TAKE 1 TABLET BY MOUTH EVERY DAY WITH BREAKFAST  . metoprolol succinate (TOPROL-XL) 100 MG 24 hr tablet take 1 tablet by mouth once daily (TAKE WITH OR IMMEDIATELY FOLLOWING A MEAL)  . Olmesartan-amLODIPine-HCTZ 40-10-25 MG TABS TAKE 1 TABLET BY MOUTH DAILY  . potassium chloride SA (KLOR-CON) 20 MEQ tablet Take 1 tablet (20 mEq total) by mouth 3 (three) times a week.  . sildenafil (VIAGRA) 100 MG tablet Take 0.5-1 tablets (50-100 mg total) by mouth daily as needed for erectile dysfunction.  Marland Kitchen spironolactone (ALDACTONE) 25 MG tablet Take 1 tablet (25 mg total) by mouth daily.  . tamsulosin (FLOMAX) 0.4 MG CAPS capsule TAKE 1 CAPSULE(0.4 MG) BY MOUTH DAILY  . traMADol (ULTRAM) 50 MG tablet Take 1 tablet (50 mg total) by mouth daily as needed for severe pain.     Allergies:   Catapres [clonidine hcl]   Past Medical History:  Diagnosis Date  . BENIGN PROSTATIC HYPERTROPHY 02/20/2007  . Depression   . Diabetes  mellitus   . ELEVATED PROSTATE SPECIFIC ANTIGEN 02/26/2009  . 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 FASCIITIS, PLANTAR 09/30/2007  . GERD 02/20/2007  . Heart murmur   . HYPERTENSION 02/20/2007  . HYPOKALEMIA 03/02/2009  . Obesity     Past Surgical History:  Procedure Laterality Date  . KNEE SURGERY Bilateral    torn cartiledge     Social History:  The patient  reports that he has never smoked. He has never used smokeless tobacco. He reports that he does not drink alcohol or use drugs.   Family History:  The patient's family history includes Heart failure in his mother; High blood pressure in his sister and sister; Other in his father.    ROS:  Please see the history of present illness. All other systems are reviewed and  Negative to the above problem except as noted.    PHYSICAL EXAM: VS:  BP (!) 158/70   Pulse 67   Ht 5' 7.5" (1.715 m)   Wt 242 lb 12.8 oz (110.1 kg)   BMI 37.47 kg/m   GEN: Morbidly obese 68 yo , in no acute distress  HEENT: normal  Neck: no JVD, carotid bruits, or masses Cardiac: RRR; no murmurs, rubs, or gallops,no edema  Respiratory:  clear to auscultation bilaterally, normal work of breathing GI: soft, nontender, nondistended, + BS  No hepatomegaly  MS:  no deformity Moving all extremities   Skin: warm and dry, no rash Neuro:  Strength and sensation are intact Psych: euthymic mood, full affect   EKG:  EKG is ordered today.  SR 67 bpm  First degree AV block  PR 220 msed  LVH     Lipid Panel    Component Value Date/Time   CHOL 150 06/02/2019 1138   TRIG 70.0 06/02/2019 1138   HDL 50.20 06/02/2019 1138   CHOLHDL 3 06/02/2019 1138   VLDL 14.0 06/02/2019 1138   LDLCALC 86 06/02/2019 1138      Wt Readings from Last 3 Encounters:  08/08/19 242 lb 12.8 oz (110.1 kg)  07/04/19 249 lb (112.9 kg)  06/02/19 247 lb 14.4 oz (112.4 kg)      ASSESSMENT AND PLAN:  1   hypertension.  Patient has very difficult hypertension He  is on multiple  medicines.  I would recommend increasing hydralazine to 50 3 times daily.  He can back down on the amlodipine, actually to off, since he is already on a combination pill with some of it in it.  I would recommend getting an ultrasound of his heart to evaluate endorgan effect.  I would also set him up for a renal artery ultrasound.  Indeed he may need an MRI.  I would also set him up for sleep evaluation the patient says his wife notes he said he snores this may be contributing.  2  Dyslipidemia.  Currently on Lipitor last lipids panel in September, LDL 86, HDL 50.  Follow up based on test results    Current medicines are reviewed at length with the patient today.  The patient does not have concerns regarding medicines.  Signed, Dorris Carnes, MD  08/08/2019 2:33 PM    [address] [facility], [place], [place]  [address] Phone: [phone]; Fax: [phone]

## 2019-08-22 ENCOUNTER — Other Ambulatory Visit: Payer: Self-pay | Admitting: Family Medicine

## 2019-08-22 ENCOUNTER — Encounter (HOSPITAL_COMMUNITY): Payer: Commercial Managed Care - PPO

## 2019-08-22 NOTE — Telephone Encounter (Signed)
Copied from [place] [id number]. Topic: Quick Communication - Rx Refill/Question >> Aug 22, 2019 11:57 AM Rainey Pines A wrote: Medication: hydrALAZINE (APRESOLINE) 50 MG tablet (90 day supply)   Has the patient contacted their pharmacy? Yes  (Agent: If no, request that the patient contact the pharmacy for the refill.) (Agent: If yes, when and what did the pharmacy advise?)Contact PCP  Preferred Pharmacy (with phone number or street name): [facility] [phone] - [place], [place] - [address] [phone] (Phone) [phone] (Fax)    Agent: Please be advised that RX refills may take up to 3 business days. We ask that you follow-up with your pharmacy.

## 2019-08-22 NOTE — Telephone Encounter (Signed)
Requested medication (s) are due for refill today: yes  Requested medication (s) are on the active medication list: yes  Last refill:  08/08/2019  Future visit scheduled: no  Notes to clinic:  Last filled by a different provider Review for refill   Requested Prescriptions  Pending Prescriptions Disp Refills   hydrALAZINE (APRESOLINE) 50 MG tablet 270 tablet 3    Sig: Take 1 tablet (50 mg total) by mouth 3 (three) times daily.     Cardiovascular:  Vasodilators Failed - 08/22/2019 12:04 PM      Failed - Last BP in normal range    BP Readings from Last 1 Encounters:  08/08/19 (!) 158/70         Passed - HCT in normal range and within 360 days    HCT  Date Value Ref Range Status  06/02/2019 40.5 39.0 - 52.0 % Final         Passed - HGB in normal range and within 360 days    Hemoglobin  Date Value Ref Range Status  06/02/2019 13.5 13.0 - 17.0 g/dL Final         Passed - RBC in normal range and within 360 days    RBC  Date Value Ref Range Status  06/02/2019 4.77 4.22 - 5.81 Mil/uL Final         Passed - WBC in normal range and within 360 days    WBC  Date Value Ref Range Status  06/02/2019 4.4 4.0 - 10.5 K/uL Final         Passed - PLT in normal range and within 360 days    Platelets  Date Value Ref Range Status  06/02/2019 257.0 150.0 - 400.0 K/uL Final         Passed - Valid encounter within last 12 months    Recent Outpatient Visits          1 month ago Chronic pain of both knees   [facility], Steele Berg, MD   2 months ago Essential hypertension   [facility], Steele Berg, MD   3 months ago Essential hypertension   [facility], Steele Berg, MD   7 months ago Essential hypertension   [facility], Steele Berg, MD   7 months ago Loose stools   [facility], Alinda Sierras, MD

## 2019-08-24 ENCOUNTER — Other Ambulatory Visit: Payer: Self-pay | Admitting: Family Medicine

## 2019-08-24 DIAGNOSIS — I10 Essential (primary) hypertension: Secondary | ICD-10-CM

## 2019-08-29 ENCOUNTER — Ambulatory Visit (HOSPITAL_COMMUNITY)
Admission: RE | Admit: 2019-08-29 | Payer: Commercial Managed Care - PPO | Source: Ambulatory Visit | Attending: Internal Medicine | Admitting: Internal Medicine

## 2019-08-29 ENCOUNTER — Other Ambulatory Visit (HOSPITAL_COMMUNITY): Payer: Commercial Managed Care - PPO

## 2019-09-23 ENCOUNTER — Other Ambulatory Visit: Payer: Self-pay | Admitting: Family Medicine

## 2019-09-23 DIAGNOSIS — I10 Essential (primary) hypertension: Secondary | ICD-10-CM

## 2019-09-26 ENCOUNTER — Other Ambulatory Visit (HOSPITAL_COMMUNITY): Payer: Commercial Managed Care - PPO

## 2019-09-26 ENCOUNTER — Other Ambulatory Visit: Payer: Self-pay

## 2019-09-26 ENCOUNTER — Ambulatory Visit (HOSPITAL_COMMUNITY): Payer: Commercial Managed Care - PPO | Attending: Cardiology

## 2019-09-26 DIAGNOSIS — I10 Essential (primary) hypertension: Secondary | ICD-10-CM | POA: Diagnosis present

## 2019-10-23 ENCOUNTER — Other Ambulatory Visit: Payer: Self-pay | Admitting: Family Medicine

## 2019-10-23 DIAGNOSIS — I10 Essential (primary) hypertension: Secondary | ICD-10-CM

## 2019-11-20 ENCOUNTER — Other Ambulatory Visit: Payer: Self-pay | Admitting: *Deleted

## 2019-11-20 DIAGNOSIS — I10 Essential (primary) hypertension: Secondary | ICD-10-CM

## 2019-11-20 MED ORDER — METFORMIN HCL ER 500 MG PO TB24: ORAL_TABLET | ORAL | 0 refills | Status: DC

## 2019-11-20 NOTE — Telephone Encounter (Signed)
Rx done. 

## 2019-11-22 ENCOUNTER — Other Ambulatory Visit: Payer: Self-pay | Admitting: Family Medicine

## 2019-11-22 DIAGNOSIS — I10 Essential (primary) hypertension: Secondary | ICD-10-CM

## 2019-11-27 ENCOUNTER — Other Ambulatory Visit: Payer: Self-pay | Admitting: Family Medicine

## 2019-11-27 ENCOUNTER — Telehealth: Payer: Self-pay | Admitting: Family Medicine

## 2019-11-27 DIAGNOSIS — I10 Essential (primary) hypertension: Secondary | ICD-10-CM

## 2019-11-27 MED ORDER — ATORVASTATIN CALCIUM 10 MG PO TABS: 10.0000 mg | ORAL_TABLET | Freq: Every day | ORAL | 0 refills | Status: DC

## 2019-11-27 MED ORDER — SILDENAFIL CITRATE 100 MG PO TABS: 50.0000 mg | ORAL_TABLET | Freq: Every day | ORAL | 11 refills | Status: DC | PRN

## 2019-11-27 MED ORDER — TAMSULOSIN HCL 0.4 MG PO CAPS: ORAL_CAPSULE | ORAL | 0 refills | Status: DC

## 2019-11-27 MED ORDER — SPIRONOLACTONE 25 MG PO TABS: 25.0000 mg | ORAL_TABLET | Freq: Every day | ORAL | 0 refills | Status: DC

## 2019-11-27 MED ORDER — METFORMIN HCL ER 500 MG PO TB24: ORAL_TABLET | ORAL | 0 refills | Status: DC

## 2019-11-27 MED ORDER — METOPROLOL SUCCINATE ER 100 MG PO TB24: ORAL_TABLET | ORAL | 0 refills | Status: DC

## 2019-11-27 MED ORDER — OLMESARTAN-AMLODIPINE-HCTZ 40-10-25 MG PO TABS: 1.0000 | ORAL_TABLET | Freq: Every day | ORAL | 0 refills | Status: DC

## 2019-11-27 MED ORDER — HYDRALAZINE HCL 50 MG PO TABS: 50.0000 mg | ORAL_TABLET | Freq: Three times a day (TID) | ORAL | 3 refills | Status: DC

## 2019-11-27 MED ORDER — POTASSIUM CHLORIDE CRYS ER 20 MEQ PO TBCR: EXTENDED_RELEASE_TABLET | ORAL | 0 refills | Status: DC

## 2019-11-27 NOTE — Telephone Encounter (Signed)
Rxs sent for medications below except for Viagra and Hydralazine.  Message sent to PCP for refill on Viagra as this was given last by Dr Amador Cunas and refill for Hydralazine was given by cardiology on 11/20.

## 2019-11-27 NOTE — Telephone Encounter (Signed)
done

## 2019-11-27 NOTE — Telephone Encounter (Signed)
Pt insurance will only cover his medication [facility] and would like a 90 day supply for each. Pt would like it filled today if possible   Medication Refill:  Metformin  Atorvastatin  Tamsulosin Hydralasine Spironolactone Olmesartan-amlodipine  Potassium Chloride  Viagra Metoprolol   [facility] [address] FAX: [phone]

## 2020-02-02 ENCOUNTER — Other Ambulatory Visit: Payer: Self-pay | Admitting: Family Medicine

## 2020-02-02 DIAGNOSIS — I10 Essential (primary) hypertension: Secondary | ICD-10-CM

## 2020-02-18 ENCOUNTER — Other Ambulatory Visit: Payer: Self-pay | Admitting: Family Medicine

## 2020-02-18 DIAGNOSIS — I10 Essential (primary) hypertension: Secondary | ICD-10-CM

## 2020-02-26 ENCOUNTER — Telehealth: Payer: Self-pay | Admitting: Family Medicine

## 2020-02-26 ENCOUNTER — Other Ambulatory Visit: Payer: Self-pay | Admitting: Family Medicine

## 2020-02-26 DIAGNOSIS — I10 Essential (primary) hypertension: Secondary | ICD-10-CM

## 2020-02-26 MED ORDER — METOPROLOL SUCCINATE ER 100 MG PO TB24: ORAL_TABLET | ORAL | 0 refills | Status: DC

## 2020-02-26 MED ORDER — OLMESARTAN-AMLODIPINE-HCTZ 40-10-25 MG PO TABS: 1.0000 | ORAL_TABLET | Freq: Every day | ORAL | 0 refills | Status: DC

## 2020-02-26 MED ORDER — HYDRALAZINE HCL 50 MG PO TABS: 50.0000 mg | ORAL_TABLET | Freq: Three times a day (TID) | ORAL | 1 refills | Status: DC

## 2020-02-26 MED ORDER — SPIRONOLACTONE 25 MG PO TABS: 25.0000 mg | ORAL_TABLET | Freq: Every day | ORAL | 0 refills | Status: DC

## 2020-02-26 NOTE — Telephone Encounter (Signed)
Please confirm current dose of medication. He actually saw Dr. Tenny Craw last and she recommended 50mg  TID dosing. I have sent rx for this amount. If he is doing something differently let me know. (he previously had the 25mg  so I believe that he was doing 2 of those to total 50mg  per dose)

## 2020-02-26 NOTE — Telephone Encounter (Signed)
Left a message for the pt to return my call.  

## 2020-02-26 NOTE — Telephone Encounter (Signed)
Spoke with the pt and scheduled a follow up appt for 6/25.  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 Message sent to PCP.

## 2020-02-26 NOTE — Telephone Encounter (Signed)
Pt call and need refills on all med's for 90 days INS will not pay for any under and 180 for  hydrALAZINE (APRESOLINE) 50 MG tablet because he take it 3 times a day.[facility]  [id number] [place], [place] - [address] AT [facility] [address] [facility] [address] Phone:  [phone]  Fax:  [phone]

## 2020-02-26 NOTE — Telephone Encounter (Signed)
Spoke with the pt and informed him of the message below.  Also sent in Rxs for a 90-day supply on Spironolacton, Olmesartan-Amlodipine-HCTZ and Metoprolol per pts request.

## 2020-02-27 LAB — HM DIABETES EYE EXAM

## 2020-03-09 ENCOUNTER — Encounter: Payer: Self-pay | Admitting: Family Medicine

## 2020-03-12 ENCOUNTER — Ambulatory Visit: Payer: Commercial Managed Care - PPO | Admitting: Family Medicine

## 2020-03-12 ENCOUNTER — Encounter: Payer: Self-pay | Admitting: Family Medicine

## 2020-03-12 ENCOUNTER — Other Ambulatory Visit: Payer: Self-pay

## 2020-03-12 VITALS — BP 142/64 | HR 60 | Temp 97.8°F | Ht 67.5 in | Wt 253.6 lb

## 2020-03-12 DIAGNOSIS — E118 Type 2 diabetes mellitus with unspecified complications: Secondary | ICD-10-CM | POA: Diagnosis not present

## 2020-03-12 DIAGNOSIS — N400 Enlarged prostate without lower urinary tract symptoms: Secondary | ICD-10-CM | POA: Diagnosis not present

## 2020-03-12 DIAGNOSIS — E1169 Type 2 diabetes mellitus with other specified complication: Secondary | ICD-10-CM

## 2020-03-12 DIAGNOSIS — I10 Essential (primary) hypertension: Secondary | ICD-10-CM | POA: Diagnosis not present

## 2020-03-12 DIAGNOSIS — K219 Gastro-esophageal reflux disease without esophagitis: Secondary | ICD-10-CM

## 2020-03-12 DIAGNOSIS — E785 Hyperlipidemia, unspecified: Secondary | ICD-10-CM | POA: Diagnosis not present

## 2020-03-12 LAB — CBC WITH DIFFERENTIAL/PLATELET
Basophils Absolute: 0 10*3/uL (ref 0.0–0.1)
Basophils Relative: 0.9 % (ref 0.0–3.0)
Eosinophils Absolute: 0.1 10*3/uL (ref 0.0–0.7)
Eosinophils Relative: 2.4 % (ref 0.0–5.0)
HCT: 40.3 % (ref 39.0–52.0)
Hemoglobin: 13.6 g/dL (ref 13.0–17.0)
Lymphocytes Relative: 34.9 % (ref 12.0–46.0)
Lymphs Abs: 1.5 10*3/uL (ref 0.7–4.0)
MCHC: 33.7 g/dL (ref 30.0–36.0)
MCV: 86 fl (ref 78.0–100.0)
Monocytes Absolute: 0.6 10*3/uL (ref 0.1–1.0)
Monocytes Relative: 13.1 % — ABNORMAL HIGH (ref 3.0–12.0)
Neutro Abs: 2.1 10*3/uL (ref 1.4–7.7)
Neutrophils Relative %: 48.7 % (ref 43.0–77.0)
Platelets: 224 10*3/uL (ref 150.0–400.0)
RBC: 4.68 Mil/uL (ref 4.22–5.81)
RDW: 14.9 % (ref 11.5–15.5)
WBC: 4.3 10*3/uL (ref 4.0–10.5)

## 2020-03-12 LAB — LIPID PANEL
Cholesterol: 172 mg/dL (ref 0–200)
HDL: 49.7 mg/dL (ref >39.00)
LDL Cholesterol: 108 mg/dL — ABNORMAL HIGH (ref 0–99)
NonHDL: 122.36
Total CHOL/HDL Ratio: 3
Triglycerides: 74 mg/dL (ref 0.0–149.0)
VLDL: 14.8 mg/dL (ref 0.0–40.0)

## 2020-03-12 LAB — HEMOGLOBIN A1C: Hgb A1c MFr Bld: 5.9 % (ref 4.6–6.5)

## 2020-03-12 LAB — COMPREHENSIVE METABOLIC PANEL
ALT: 24 U/L (ref 0–53)
AST: 25 U/L (ref 0–37)
Albumin: 3.9 g/dL (ref 3.5–5.2)
Alkaline Phosphatase: 58 U/L (ref 39–117)
BUN: 18 mg/dL (ref 6–23)
CO2: 26 mEq/L (ref 19–32)
Calcium: 9 mg/dL (ref 8.4–10.5)
Chloride: 102 mEq/L (ref 96–112)
Creatinine, Ser: 0.73 mg/dL (ref 0.40–1.50)
GFR: 128.81 mL/min (ref >60.00)
Glucose, Bld: 119 mg/dL — ABNORMAL HIGH (ref 70–99)
Potassium: 3.9 mEq/L (ref 3.5–5.1)
Sodium: 137 mEq/L (ref 135–145)
Total Bilirubin: 0.7 mg/dL (ref 0.2–1.2)
Total Protein: 6.9 g/dL (ref 6.0–8.3)

## 2020-03-12 LAB — MICROALBUMIN / CREATININE URINE RATIO
Creatinine,U: 71.1 mg/dL
Microalb Creat Ratio: 1 mg/g (ref 0.0–30.0)
Microalb, Ur: <0.7 mg/dL (ref 0.0–1.9)

## 2020-03-12 MED ORDER — TAMSULOSIN HCL 0.4 MG PO CAPS: ORAL_CAPSULE | ORAL | 0 refills | Status: DC

## 2020-03-12 MED ORDER — POTASSIUM CHLORIDE CRYS ER 20 MEQ PO TBCR: EXTENDED_RELEASE_TABLET | ORAL | 1 refills | Status: DC

## 2020-03-12 MED ORDER — METFORMIN HCL ER 500 MG PO TB24: 500.0000 mg | ORAL_TABLET | Freq: Every day | ORAL | 1 refills | Status: DC

## 2020-03-12 MED ORDER — SILDENAFIL CITRATE 100 MG PO TABS: 50.0000 mg | ORAL_TABLET | Freq: Every day | ORAL | 1 refills | Status: DC | PRN

## 2020-03-12 MED ORDER — PRAVASTATIN SODIUM 20 MG PO TABS: 20.0000 mg | ORAL_TABLET | Freq: Every day | ORAL | 1 refills | Status: DC

## 2020-03-12 NOTE — Patient Instructions (Addendum)
Try taking the tamsulosin during the day and see if this helps with night time urination. Let me know if this doesn't help with night time urination.   Check with your previous [facility] pharmacy to see if you had the pneumonia shot there? Tetanus shot there? If so have them fax [place] those shot dates.

## 2020-03-12 NOTE — Addendum Note (Signed)
Addended by: Lerry Liner on: 03/12/2020 09:09 AM   Modules accepted: Orders

## 2020-03-12 NOTE — Progress Notes (Signed)
Javier [name] DOB: 1951/06/16 Encounter date: 03/12/2020  This is a 69 y.o. male who presents with Chief Complaint  Patient presents with   Follow-up    History of present illness:  HTN: hydralazine 50mg  TID- metoprolol 100mg  daily, olmesartan-amlodipine-hctz 40-10-25, spironolactone 25mg  daily. Hasn't checked pressures at home lately, but at eye doc last week was 140's/70's. 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 Hasn't been walking; has been eating more - wife cooking a lot more. Usually eats lunch out during work week.   Enlarged prostate: flomax  DMII: metformin - hasn't checked sugars in awhile.   GERD: not having issues with this.   HL: stopped taking the lipitor due to bad headaches with medication. Stopped a couple of months ago.   Energy level is pretty good overall; but occasionally tired. Waking up multiple times/night to urinate. Emptying bladder ok with the tamsulosin.   Allergies  Allergen Reactions   Catapres [Clonidine Hcl]     Vivid dreams and loss of libido   Current Meds  Medication Sig   atorvastatin (LIPITOR) 10 MG tablet TAKE 1 TABLET BY MOUTH EVERY DAY   hydrALAZINE (APRESOLINE) 50 MG tablet Take 1 tablet (50 mg total) by mouth 3 (three) times daily.   metFORMIN (GLUCOPHAGE-XR) 500 MG 24 hr tablet TAKE 1 TABLET BY MOUTH EVERY DAY WITH BREAKFAST   metoprolol succinate (TOPROL-XL) 100 MG 24 hr tablet Take with or immediately following a meal.   Olmesartan-amLODIPine-HCTZ 40-10-25 MG TABS Take 1 tablet by mouth daily.   potassium chloride SA (KLOR-CON M20) 20 MEQ tablet TAKE 1 TABLET BY MOUTH EVERY DAY   sildenafil (VIAGRA) 100 MG tablet Take 0.5-1 tablets (50-100 mg total) by mouth daily as needed for erectile dysfunction.   spironolactone (ALDACTONE) 25 MG tablet Take 1 tablet (25 mg total) by mouth daily.   tamsulosin (FLOMAX) 0.4 MG CAPS capsule TAKE 1 CAPSULE BY MOUTH EVERY DAY   traMADol (ULTRAM) 50 MG tablet Take 1 tablet (50 mg  total) by mouth daily as needed for severe pain.    Review of Systems  Constitutional: Negative for chills, fatigue and fever.  Respiratory: Negative for cough, chest tightness, shortness of breath and wheezing.   Cardiovascular: Negative for chest pain, palpitations and leg swelling.    Objective:  BP (!) 142/64 (BP Location: Left Arm, Patient Position: Sitting, Cuff Size: Large)    Pulse 60    Temp 97.8 F (36.6 C) (Temporal)    Ht 5' 7.5" (1.715 m)    Wt 253 lb 9.6 oz (115 kg)    SpO2 96%    BMI 39.13 kg/m   Weight: 253 lb 9.6 oz (115 kg)   BP Readings from Last 3 Encounters:  03/12/20 (!) 142/64  08/08/19 (!) 158/70  07/04/19 (!) 180/80   Wt Readings from Last 3 Encounters:  03/12/20 253 lb 9.6 oz (115 kg)  08/08/19 242 lb 12.8 oz (110.1 kg)  07/04/19 249 lb (112.9 kg)    Physical Exam Constitutional:      General: He is not in acute distress.    Appearance: He is well-developed.  HENT:     Head: Normocephalic and atraumatic.  Cardiovascular:     Rate and Rhythm: Normal rate and regular rhythm.     Heart sounds: Normal heart sounds. No murmur heard.   Pulmonary:     Effort: Pulmonary effort is normal.     Breath sounds: Normal breath sounds.  Abdominal:     General: Bowel  sounds are normal. There is no distension.     Palpations: Abdomen is soft.     Tenderness: There is no abdominal tenderness. There is no guarding.  Feet:     Comments: Onychomycosis right great and third toe Skin:    General: Skin is warm and dry.     Comments: Sensory exam of the foot is normal, tested with the monofilament. Good pulses, no lesions or ulcers, good peripheral pulses.  Stasis dermatitis lower extrem bilat  Psychiatric:        Judgment: Judgment normal.     Assessment/Plan  1. Essential hypertension  Improved control. We discussed importance of maintaining healthy weight today. He has been doing burgers on daily basis and we discussed switching to chicken or lower fat  meals.  - CBC with Differential/Platelet; Future - Comprehensive metabolic panel; Future - metFORMIN (GLUCOPHAGE-XR) 500 MG 24 hr tablet; Take 1 tablet (500 mg total) by mouth daily with breakfast.  Dispense: 90 tablet; Refill: 1 - tamsulosin (FLOMAX) 0.4 MG CAPS capsule; TAKE 1 CAPSULE BY MOUTH EVERY DAY  Dispense: 30 capsule; Refill: 0  2. Gastroesophageal reflux disease without esophagitis Well controlled without medication.   3. Type II diabetes mellitus with complication (HCC) Uncertain control; not checking sugars at home. Will determine and change in medications pending bloodwork results.  - Hemoglobin A1c; Future - Microalbumin / creatinine urine ratio; Future - HM DIABETES FOOT EXAM  4. Benign prostatic hyperplasia, unspecified whether lower urinary tract symptoms present Switch flomax to morning. He will let me know how he does with this.   5. Hyperlipidemia associated with type 2 diabetes mellitus (HCC) Has been fairly well controlled with diet; but we discussed importance of preventative health with statin; he is willing to try pravastatin (headache with lipitor) - Lipid panel; Future - pravastatin (PRAVACHOL) 20 MG tablet; Take 1 tablet (20 mg total) by mouth daily.  Dispense: 90 tablet; Refill: 1   Return in about 6 months (around 09/11/2020) for physical exam.     Theodis Shove, MD

## 2020-03-16 ENCOUNTER — Other Ambulatory Visit: Payer: Self-pay | Admitting: *Deleted

## 2020-03-16 DIAGNOSIS — I10 Essential (primary) hypertension: Secondary | ICD-10-CM

## 2020-03-16 MED ORDER — TAMSULOSIN HCL 0.4 MG PO CAPS: ORAL_CAPSULE | ORAL | 0 refills | Status: DC

## 2020-03-16 NOTE — Telephone Encounter (Signed)
Rx done. 

## 2020-03-23 ENCOUNTER — Telehealth: Payer: Self-pay | Admitting: Family Medicine

## 2020-03-23 NOTE — Addendum Note (Signed)
Addended by: Johnella Moloney on: 03/23/2020 02:10 PM   Modules accepted: Orders

## 2020-03-23 NOTE — Telephone Encounter (Signed)
Pt Javier [name] is returning your call and would like a call back.

## 2020-03-23 NOTE — Telephone Encounter (Signed)
See results note. 

## 2020-03-24 ENCOUNTER — Other Ambulatory Visit: Payer: Self-pay | Admitting: Family Medicine

## 2020-03-24 ENCOUNTER — Encounter: Payer: Self-pay | Admitting: Family Medicine

## 2020-03-24 MED ORDER — ROSUVASTATIN CALCIUM 5 MG PO TABS: 5.0000 mg | ORAL_TABLET | Freq: Every day | ORAL | 1 refills | Status: DC

## 2020-04-21 ENCOUNTER — Other Ambulatory Visit: Payer: Self-pay

## 2020-04-21 ENCOUNTER — Emergency Department (HOSPITAL_COMMUNITY)
Admission: EM | Admit: 2020-04-21 | Discharge: 2020-04-22 | Disposition: A | Discharge status: Home / Self Care | Payer: Commercial Managed Care - PPO | Attending: Emergency Medicine | Admitting: Emergency Medicine

## 2020-04-21 ENCOUNTER — Encounter (HOSPITAL_COMMUNITY): Payer: Self-pay | Admitting: Emergency Medicine

## 2020-04-21 DIAGNOSIS — R42 Dizziness and giddiness: Secondary | ICD-10-CM | POA: Diagnosis not present

## 2020-04-21 DIAGNOSIS — R109 Unspecified abdominal pain: Secondary | ICD-10-CM | POA: Insufficient documentation

## 2020-04-21 DIAGNOSIS — R197 Diarrhea, unspecified: Secondary | ICD-10-CM | POA: Insufficient documentation

## 2020-04-21 DIAGNOSIS — Z5321 Procedure and treatment not carried out due to patient leaving prior to being seen by health care provider: Secondary | ICD-10-CM | POA: Insufficient documentation

## 2020-04-21 LAB — BASIC METABOLIC PANEL
Anion gap: 9 (ref 5–15)
BUN: 18 mg/dL (ref 8–23)
CO2: 25 mmol/L (ref 22–32)
Calcium: 9 mg/dL (ref 8.9–10.3)
Chloride: 103 mmol/L (ref 98–111)
Creatinine, Ser: 0.85 mg/dL (ref 0.61–1.24)
GFR calc Af Amer: >60 mL/min (ref >60)
GFR calc non Af Amer: >60 mL/min (ref >60)
Glucose, Bld: 132 mg/dL — ABNORMAL HIGH (ref 70–99)
Potassium: 3.8 mmol/L (ref 3.5–5.1)
Sodium: 137 mmol/L (ref 135–145)

## 2020-04-21 LAB — URINALYSIS, ROUTINE W REFLEX MICROSCOPIC
Bilirubin Urine: NEGATIVE
Glucose, UA: NEGATIVE mg/dL
Hgb urine dipstick: NEGATIVE
Ketones, ur: NEGATIVE mg/dL
Leukocytes,Ua: NEGATIVE
Nitrite: NEGATIVE
Protein, ur: NEGATIVE mg/dL
Specific Gravity, Urine: 1.018 (ref 1.005–1.030)
pH: 5 (ref 5.0–8.0)

## 2020-04-21 LAB — CBC
HCT: 46.7 % (ref 39.0–52.0)
Hemoglobin: 14.7 g/dL (ref 13.0–17.0)
MCH: 27.6 pg (ref 26.0–34.0)
MCHC: 31.5 g/dL (ref 30.0–36.0)
MCV: 87.6 fL (ref 80.0–100.0)
Platelets: 260 10*3/uL (ref 150–400)
RBC: 5.33 MIL/uL (ref 4.22–5.81)
RDW: 14.6 % (ref 11.5–15.5)
WBC: 5.5 10*3/uL (ref 4.0–10.5)
nRBC: 0 % (ref 0.0–0.2)

## 2020-04-21 LAB — CBG MONITORING, ED: Glucose-Capillary: 145 mg/dL — ABNORMAL HIGH (ref 70–99)

## 2020-04-21 MED ORDER — SODIUM CHLORIDE 0.9% FLUSH: 3.0000 mL | Freq: Once | INTRAVENOUS | Status: DC

## 2020-04-21 NOTE — ED Triage Notes (Signed)
Pt states he started feeling very dizzy today like at lunch time, pt had abd cramping and diarrhea yesterday and today he felt like he will pass out while driving. Pt denies any pain or SOB, pt is AO x 4 on triage, no  Neuro deficit noticed during triage.

## 2020-04-22 NOTE — ED Notes (Signed)
Pt called for bed x2, no response.

## 2020-04-22 NOTE — ED Notes (Signed)
Pt not found in waiting room.

## 2020-04-24 ENCOUNTER — Ambulatory Visit (HOSPITAL_COMMUNITY)
Admission: EM | Admit: 2020-04-24 | Discharge: 2020-04-24 | Disposition: A | Discharge status: Home / Self Care | Payer: Commercial Managed Care - PPO | Attending: Emergency Medicine | Admitting: Emergency Medicine

## 2020-04-24 ENCOUNTER — Other Ambulatory Visit: Payer: Self-pay

## 2020-04-24 ENCOUNTER — Encounter (HOSPITAL_COMMUNITY): Payer: Self-pay

## 2020-04-24 DIAGNOSIS — R109 Unspecified abdominal pain: Secondary | ICD-10-CM | POA: Insufficient documentation

## 2020-04-24 DIAGNOSIS — N529 Male erectile dysfunction, unspecified: Secondary | ICD-10-CM | POA: Insufficient documentation

## 2020-04-24 DIAGNOSIS — B349 Viral infection, unspecified: Secondary | ICD-10-CM | POA: Insufficient documentation

## 2020-04-24 DIAGNOSIS — R6883 Chills (without fever): Secondary | ICD-10-CM | POA: Insufficient documentation

## 2020-04-24 DIAGNOSIS — I10 Essential (primary) hypertension: Secondary | ICD-10-CM | POA: Diagnosis not present

## 2020-04-24 DIAGNOSIS — Z7984 Long term (current) use of oral hypoglycemic drugs: Secondary | ICD-10-CM | POA: Insufficient documentation

## 2020-04-24 DIAGNOSIS — Z20822 Contact with and (suspected) exposure to covid-19: Secondary | ICD-10-CM | POA: Insufficient documentation

## 2020-04-24 DIAGNOSIS — Z79899 Other long term (current) drug therapy: Secondary | ICD-10-CM | POA: Diagnosis not present

## 2020-04-24 DIAGNOSIS — Z8249 Family history of ischemic heart disease and other diseases of the circulatory system: Secondary | ICD-10-CM | POA: Diagnosis not present

## 2020-04-24 DIAGNOSIS — R197 Diarrhea, unspecified: Secondary | ICD-10-CM | POA: Diagnosis not present

## 2020-04-24 DIAGNOSIS — E119 Type 2 diabetes mellitus without complications: Secondary | ICD-10-CM | POA: Insufficient documentation

## 2020-04-24 LAB — SARS CORONAVIRUS 2 (TAT 6-24 HRS): SARS Coronavirus 2: NEGATIVE

## 2020-04-24 MED ORDER — ONDANSETRON 4 MG PO TBDP
4.0000 mg | ORAL_TABLET | Freq: Three times a day (TID) | ORAL | 0 refills | Status: DC | PRN
Start: 2020-04-24 — End: 2020-10-29

## 2020-04-24 MED ORDER — DICYCLOMINE HCL 20 MG PO TABS
20.0000 mg | ORAL_TABLET | Freq: Three times a day (TID) | ORAL | 0 refills | Status: DC
Start: 2020-04-24 — End: 2020-10-29

## 2020-04-24 NOTE — ED Triage Notes (Signed)
Pt states on Thursday he ate some [place] food and got sick,symptom started with abdominal pain and diarrhea.

## 2020-04-24 NOTE — Discharge Instructions (Signed)
Covid test pending, monitor my chart for results Rest and fluids May use Zofran/ondansetron as needed if developing any nausea or vomiting May try dicyclomine/Bentyl as needed for any cramping that may develop before meals and bedtime May try over-the-counter Pepto-Bismol to help with diarrhea Focus on rehydration Follow-up if not improving or worsening, developing fevers, abdominal pain

## 2020-04-26 ENCOUNTER — Telehealth (HOSPITAL_COMMUNITY): Payer: Self-pay

## 2020-04-26 NOTE — ED Provider Notes (Signed)
[facility]    CSN: [id number] Arrival date & time: 04/24/20  1504      History   Chief Complaint Chief Complaint  Patient presents with  . Diarrhea  . Abdominal Pain  . Urinary Frequency    HPI Javier [name] is a 69 y.o. male history of GERD, hypertension, BPH presenting today for evaluation of abdominal pain and diarrhea.  Patient reports that symptoms began Thursday evening after eating some [place] food.  He went to the emergency room afterward and had some blood work done-on chart review shows normal BMP with only mildly elevated glucose at 132, CBC with normal white count, UA unremarkable.  He states his symptoms subsided Friday, but returned again yesterday and today.  He has felt very shaky and with chills.  He has had an uneasy stomach and frequent loose stools.  He denies any vomiting.  Has had some cramping which comes and goes.  HPI  Past Medical History:  Diagnosis Date  . Adverse effect of benzodiazepine 10/15/2011   Klonopin Rx expired    . BENIGN PROSTATIC HYPERTROPHY 02/20/2007  . Depression   . Diabetes mellitus   . ELEVATED PROSTATE SPECIFIC ANTIGEN 02/26/2009  . 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 FASCIITIS, PLANTAR 09/30/2007  . GERD 02/20/2007  . Heart murmur   . HYPERTENSION 02/20/2007  . HYPOKALEMIA 03/02/2009  . Obesity     Patient Active Problem List   Diagnosis Date Noted  . Type II diabetes mellitus with complication (HCC) 10/04/2015  . Anxiety disorder 06/02/2011  . 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 HYPOKALEMIA 03/02/2009  . ELEVATED PROSTATE SPECIFIC ANTIGEN 02/26/2009  . FASCIITIS, PLANTAR 09/30/2007  . Essential hypertension 02/20/2007  . GERD 02/20/2007  . BPH (benign prostatic hyperplasia) 02/20/2007    Past Surgical History:  Procedure Laterality Date  . KNEE SURGERY Bilateral    torn cartiledge       Home Medications    Prior to Admission medications   Medication Sig Start Date End Date Taking?  Authorizing Provider  dicyclomine (BENTYL) 20 MG tablet Take 1 tablet (20 mg total) by mouth 4 (four) times daily -  before meals and at bedtime. 04/24/20   Zamiah Tollett C, PA-C  hydrALAZINE (APRESOLINE) 50 MG tablet Take 1 tablet (50 mg total) by mouth 3 (three) times daily. 02/26/20   Wynn Banker, MD  metFORMIN (GLUCOPHAGE-XR) 500 MG 24 hr tablet Take 1 tablet (500 mg total) by mouth daily with breakfast. 03/12/20   Wynn Banker, MD  metoprolol succinate (TOPROL-XL) 100 MG 24 hr tablet Take with or immediately following a meal. 02/26/20   Koberlein, Paris Lore, MD  Olmesartan-amLODIPine-HCTZ 40-10-25 MG TABS Take 1 tablet by mouth daily. 02/26/20   Koberlein, Paris Lore, MD  ondansetron (ZOFRAN ODT) 4 MG disintegrating tablet Take 1 tablet (4 mg total) by mouth every 8 (eight) hours as needed for nausea or vomiting. 04/24/20   Jamin Humphries C, PA-C  potassium chloride SA (KLOR-CON M20) 20 MEQ tablet TAKE 1 TABLET BY MOUTH EVERY DAY 03/12/20   Koberlein, Jannette Spanner C, MD  rosuvastatin (CRESTOR) 5 MG tablet Take 1 tablet (5 mg total) by mouth daily. 03/24/20   Wynn Banker, MD  sildenafil (VIAGRA) 100 MG tablet Take 0.5-1 tablets (50-100 mg total) by mouth daily as needed for erectile dysfunction. 03/12/20   Wynn Banker, MD  spironolactone (ALDACTONE) 25 MG tablet Take 1 tablet (25 mg total) by mouth daily. 02/26/20   Koberlein, Paris Lore,  MD  tamsulosin (FLOMAX) 0.4 MG CAPS capsule TAKE 1 CAPSULE BY MOUTH EVERY DAY 03/16/20   Wynn Banker, MD    Family History Family History  Problem Relation Age of Onset  . Heart failure Mother   . Other Father        no known med history  . High blood pressure Sister   . High blood pressure Sister     Social History Social History   Tobacco Use  . Smoking status: Never Smoker  . Smokeless tobacco: Never Used  Substance Use Topics  . Alcohol use: No  . Drug use: No     Allergies   Atorvastatin, Catapres [clonidine hcl], and  Pravastatin sodium   Review of Systems Review of Systems  Constitutional: Positive for chills and fatigue. Negative for activity change, appetite change and fever.  HENT: Negative for congestion, ear pain, rhinorrhea, sinus pressure, sore throat and trouble swallowing.   Eyes: Negative for discharge and redness.  Respiratory: Negative for cough, chest tightness and shortness of breath.   Cardiovascular: Negative for chest pain.  Gastrointestinal: Positive for abdominal pain, diarrhea and nausea. Negative for vomiting.  Musculoskeletal: Negative for myalgias.  Skin: Negative for rash.  Neurological: Negative for dizziness, light-headedness and headaches.     Physical Exam Triage Vital Signs ED Triage Vitals  Enc Vitals Group     BP 04/24/20 1633 (!) 154/63     Pulse Rate 04/24/20 1633 60     Resp 04/24/20 1633 18     Temp 04/24/20 1633 98.5 F (36.9 C)     Temp Source 04/24/20 1633 Oral     SpO2 04/24/20 1633 100 %     Weight --      Height --      Head Circumference --      Peak Flow --      Pain Score 04/24/20 1632 2     Pain Loc --      Pain Edu? --      Excl. in GC? --    No data found.  Updated Vital Signs BP (!) 154/63 (BP Location: Left Arm)   Pulse 60   Temp 98.5 F (36.9 C) (Oral)   Resp 18   SpO2 100%   Visual Acuity Right Eye Distance:   Left Eye Distance:   Bilateral Distance:    Right Eye Near:   Left Eye Near:    Bilateral Near:     Physical Exam Vitals and nursing note reviewed.  Constitutional:      Appearance: He is well-developed.     Comments: No acute distress  HENT:     Head: Normocephalic and atraumatic.     Ears:     Comments: Bilateral ears without tenderness to palpation of external auricle, tragus and mastoid, EAC's without erythema or swelling, TM's with good bony landmarks and cone of light. Non erythematous.     Nose: Nose normal.     Mouth/Throat:     Comments: Oral mucosa pink and moist, no tonsillar enlargement or  exudate. Posterior pharynx patent and nonerythematous, no uvula deviation or swelling. Normal phonation.  Eyes:     Conjunctiva/sclera: Conjunctivae normal.  Cardiovascular:     Rate and Rhythm: Normal rate.  Pulmonary:     Effort: Pulmonary effort is normal. No respiratory distress.     Comments: Breathing comfortably at rest, CTABL, no wheezing, rales or other adventitious sounds auscultated  Abdominal:     General: There is no distension.  Comments: Abdomen soft, nondistended, nontender to light and deep palpation throughout abdomen  Musculoskeletal:        General: Normal range of motion.     Cervical back: Neck supple.  Skin:    General: Skin is warm and dry.  Neurological:     Mental Status: He is alert and oriented to person, place, and time.      UC Treatments / Results  Labs (all labs ordered are listed, but only abnormal results are displayed) Labs Reviewed  SARS CORONAVIRUS 2 (TAT 6-24 HRS)    EKG   Radiology No results found.  Procedures Procedures (including critical care time)  Medications Ordered in UC Medications - No data to display  Initial Impression / Assessment and Plan / UC Course  I have reviewed the triage vital signs and the nursing notes.  Pertinent labs & imaging results that were available during my care of the patient were reviewed by me and considered in my medical decision making (see chart for details).     Covid test pending for screening.  Suspect likely viral etiology and recommending continued symptomatic and supportive care.  Blood work from ED reassuring, currently without peritoneal signs, do not suspect abdominal emergency.  Will provide Zofran to use for nausea, Bentyl for cramping.  Push fluids.  Discussed strict return precautions. Patient verbalized understanding and is agreeable with plan.  Final Clinical Impressions(s) / UC Diagnoses   Final diagnoses:  Diarrhea, unspecified type  Viral illness      Discharge Instructions     Covid test pending, monitor my chart for results Rest and fluids May use Zofran/ondansetron as needed if developing any nausea or vomiting May try dicyclomine/Bentyl as needed for any cramping that may develop before meals and bedtime May try over-the-counter Pepto-Bismol to help with diarrhea Focus on rehydration Follow-up if not improving or worsening, developing fevers, abdominal pain   ED Prescriptions    Medication Sig Dispense Auth. Provider   ondansetron (ZOFRAN ODT) 4 MG disintegrating tablet Take 1 tablet (4 mg total) by mouth every 8 (eight) hours as needed for nausea or vomiting. 20 tablet Mazey Mantell C, PA-C   dicyclomine (BENTYL) 20 MG tablet Take 1 tablet (20 mg total) by mouth 4 (four) times daily -  before meals and at bedtime. 20 tablet Riley Hallum, [place] C, PA-C     PDMP not reviewed this encounter.   Lew Dawes, [place] 04/26/20 1734

## 2020-05-21 ENCOUNTER — Other Ambulatory Visit: Payer: Self-pay | Admitting: Family Medicine

## 2020-05-21 DIAGNOSIS — I10 Essential (primary) hypertension: Secondary | ICD-10-CM

## 2020-06-08 ENCOUNTER — Other Ambulatory Visit: Payer: Self-pay | Admitting: Family Medicine

## 2020-06-08 DIAGNOSIS — I10 Essential (primary) hypertension: Secondary | ICD-10-CM

## 2020-06-21 ENCOUNTER — Other Ambulatory Visit: Payer: Self-pay | Admitting: Family Medicine

## 2020-06-21 DIAGNOSIS — E118 Type 2 diabetes mellitus with unspecified complications: Secondary | ICD-10-CM

## 2020-06-25 ENCOUNTER — Other Ambulatory Visit (INDEPENDENT_AMBULATORY_CARE_PROVIDER_SITE_OTHER): Payer: Commercial Managed Care - PPO

## 2020-06-25 ENCOUNTER — Other Ambulatory Visit: Payer: Self-pay

## 2020-06-25 DIAGNOSIS — E118 Type 2 diabetes mellitus with unspecified complications: Secondary | ICD-10-CM | POA: Diagnosis not present

## 2020-06-26 LAB — UNLABELED: Test Ordered On Req: 16802

## 2020-06-29 ENCOUNTER — Other Ambulatory Visit: Payer: Self-pay

## 2020-06-29 ENCOUNTER — Encounter: Payer: Self-pay | Admitting: Family Medicine

## 2020-06-29 ENCOUNTER — Other Ambulatory Visit (INDEPENDENT_AMBULATORY_CARE_PROVIDER_SITE_OTHER): Payer: Commercial Managed Care - PPO

## 2020-06-29 DIAGNOSIS — E118 Type 2 diabetes mellitus with unspecified complications: Secondary | ICD-10-CM

## 2020-06-29 LAB — POCT GLYCOSYLATED HEMOGLOBIN (HGB A1C): Hemoglobin A1C: 5.7 % — AB (ref 4.0–5.6)

## 2020-06-29 NOTE — Progress Notes (Signed)
POC A1C being done as when lab was drawn for A1C it was sent to lab unlabeled/pt agreed to come in to have this done and is not upset/thx dmf

## 2020-07-19 ENCOUNTER — Other Ambulatory Visit: Payer: Self-pay | Admitting: Family Medicine

## 2020-07-19 DIAGNOSIS — I10 Essential (primary) hypertension: Secondary | ICD-10-CM

## 2020-08-14 ENCOUNTER — Other Ambulatory Visit: Payer: Self-pay | Admitting: Family Medicine

## 2020-08-14 DIAGNOSIS — I10 Essential (primary) hypertension: Secondary | ICD-10-CM

## 2020-08-27 ENCOUNTER — Ambulatory Visit: Payer: Commercial Managed Care - PPO | Admitting: Family Medicine

## 2020-08-27 NOTE — Progress Notes (Unsigned)
GILLIAN KLUEVER DOB: [birth year] Encounter date: 08/27/2020  This is a 69 y.o. male who presents for complete physical   History of present illness/Additional concerns: *Due for pneumonia shot today. HTN: hydralazine 50mg  TID- metoprolol 100mg  daily, olmesartan-amlodipine-hctz 40-10-25, spironolactone 25mg  daily.  *** Enlarged prostate: flomax; last visit I encouraged him to change the Flomax to the morning to see if this helps with the urination.  DMII: metformin daily- hasn't checked sugars in awhile.   GERD: not having issues with this.   HL: stopped taking the lipitor due to bad headaches with medication.  At last visit we started pravastatin to see if he tolerated this better.  Cologuard was completed 07/25/2019 and was negative.  Repeat in 3 years.   Past Medical History:  Diagnosis Date  . Adverse effect of benzodiazepine 10/15/2011   Klonopin Rx expired    . BENIGN PROSTATIC HYPERTROPHY 02/20/2007  . Depression   . Diabetes mellitus   . ELEVATED PROSTATE SPECIFIC ANTIGEN 02/26/2009  . 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 FASCIITIS, PLANTAR 09/30/2007  . GERD 02/20/2007  . Heart murmur   . HYPERTENSION 02/20/2007  . HYPOKALEMIA 03/02/2009  . Obesity    Past Surgical History:  Procedure Laterality Date  . KNEE SURGERY Bilateral    torn cartiledge   Allergies  Allergen Reactions  . Atorvastatin     headache  . Catapres [Clonidine Hcl]     Vivid dreams and loss of libido  . Pravastatin Sodium     headache   No outpatient medications have been marked as taking for the 08/27/20 encounter (Appointment) with 04/22/2007, MD.   Social History   Tobacco Use  . Smoking status: Never Smoker  . Smokeless tobacco: Never Used  Substance Use Topics  . Alcohol use: No   Family History  Problem Relation Age of Onset  . Heart failure Mother   . Other Father        no known med history  . High blood pressure Sister   . High blood pressure Sister       Review of Systems  CBC:  Lab Results  Component Value Date   WBC 5.5 04/21/2020   HGB 14.7 04/21/2020   HCT 46.7 04/21/2020   MCH 27.6 04/21/2020   MCHC 31.5 04/21/2020   RDW 14.6 04/21/2020   PLT 260 04/21/2020   CMP: Lab Results  Component Value Date   NA 137 04/21/2020   K 3.8 04/21/2020   CL 103 04/21/2020   CO2 25 04/21/2020   ANIONGAP 9 04/21/2020   GLUCOSE 132 (H) 04/21/2020   BUN 18 04/21/2020   CREATININE 0.85 04/21/2020   CREATININE 0.71 07/04/2019   GFRAA >60 04/21/2020   CALCIUM 9.0 04/21/2020   PROT 6.9 03/12/2020   BILITOT 0.7 03/12/2020   ALKPHOS 58 03/12/2020   ALT 24 03/12/2020   AST 25 03/12/2020   LIPID: Lab Results  Component Value Date   CHOL 172 03/12/2020   TRIG 74.0 03/12/2020   HDL 49.70 03/12/2020   LDLCALC 108 (H) 03/12/2020    Objective:  There were no vitals taken for this visit.      BP Readings from Last 3 Encounters:  04/24/20 (!) 154/63  04/21/20 (!) 147/64  03/12/20 (!) 142/64   Wt Readings from Last 3 Encounters:  04/21/20 253 lb 8.5 oz (115 kg)  03/12/20 253 lb 9.6 oz (115 kg)  08/08/19 242 lb 12.8 oz (110.1 kg)    Physical Exam  Assessment/Plan:  Health Maintenance Due  Topic Date Due  . PNA vac Low Risk Adult (2 of 2 - PPSV23) 08/05/2017  . TETANUS/TDAP  03/03/2019  . INFLUENZA VACCINE  04/18/2020  . COVID-19 Vaccine (3 - Booster for Pfizer series) 06/24/2020   Health Maintenance reviewed - {health maintenance:315237}.  There are no diagnoses linked to this encounter.  No follow-ups on file.  Theodis Shove, MD

## 2020-09-03 ENCOUNTER — Telehealth: Payer: Self-pay | Admitting: Family Medicine

## 2020-09-03 MED ORDER — HYDRALAZINE HCL 50 MG PO TABS: 50.0000 mg | ORAL_TABLET | Freq: Three times a day (TID) | ORAL | 1 refills | Status: DC

## 2020-09-03 NOTE — Telephone Encounter (Signed)
Rx done. 

## 2020-09-03 NOTE — Telephone Encounter (Signed)
Patient is calling and requesting a refill for hydrALAZINE (APRESOLINE) 50 MG tablet sent to [id number] [address], [place]  [address] [place] [address]  Phone:  [phone] Fax:  [phone]  CB is [phone]

## 2020-09-06 ENCOUNTER — Other Ambulatory Visit: Payer: Self-pay | Admitting: Family Medicine

## 2020-09-06 DIAGNOSIS — I10 Essential (primary) hypertension: Secondary | ICD-10-CM

## 2020-09-10 ENCOUNTER — Other Ambulatory Visit: Payer: Self-pay | Admitting: Family Medicine

## 2020-09-10 DIAGNOSIS — E785 Hyperlipidemia, unspecified: Secondary | ICD-10-CM

## 2020-09-10 DIAGNOSIS — I10 Essential (primary) hypertension: Secondary | ICD-10-CM

## 2020-10-17 ENCOUNTER — Other Ambulatory Visit: Payer: Self-pay | Admitting: Family Medicine

## 2020-10-17 DIAGNOSIS — I10 Essential (primary) hypertension: Secondary | ICD-10-CM

## 2020-10-28 ENCOUNTER — Other Ambulatory Visit: Payer: Self-pay

## 2020-10-29 ENCOUNTER — Encounter: Payer: Self-pay | Admitting: Family Medicine

## 2020-10-29 ENCOUNTER — Ambulatory Visit (INDEPENDENT_AMBULATORY_CARE_PROVIDER_SITE_OTHER): Payer: Commercial Managed Care - PPO | Admitting: Family Medicine

## 2020-10-29 VITALS — BP 130/62 | HR 65 | Temp 98.1°F | Ht 67.5 in | Wt 261.5 lb

## 2020-10-29 DIAGNOSIS — K219 Gastro-esophageal reflux disease without esophagitis: Secondary | ICD-10-CM | POA: Diagnosis not present

## 2020-10-29 DIAGNOSIS — Z0000 Encounter for general adult medical examination without abnormal findings: Secondary | ICD-10-CM

## 2020-10-29 DIAGNOSIS — R42 Dizziness and giddiness: Secondary | ICD-10-CM | POA: Diagnosis not present

## 2020-10-29 DIAGNOSIS — E785 Hyperlipidemia, unspecified: Secondary | ICD-10-CM

## 2020-10-29 DIAGNOSIS — R739 Hyperglycemia, unspecified: Secondary | ICD-10-CM

## 2020-10-29 DIAGNOSIS — N400 Enlarged prostate without lower urinary tract symptoms: Secondary | ICD-10-CM

## 2020-10-29 DIAGNOSIS — E538 Deficiency of other specified B group vitamins: Secondary | ICD-10-CM

## 2020-10-29 DIAGNOSIS — E559 Vitamin D deficiency, unspecified: Secondary | ICD-10-CM

## 2020-10-29 DIAGNOSIS — N62 Hypertrophy of breast: Secondary | ICD-10-CM

## 2020-10-29 DIAGNOSIS — I10 Essential (primary) hypertension: Secondary | ICD-10-CM

## 2020-10-29 LAB — MICROALBUMIN / CREATININE URINE RATIO
Creatinine,U: 122.3 mg/dL
Microalb Creat Ratio: 0.6 mg/g (ref 0.0–30.0)
Microalb, Ur: <0.7 mg/dL (ref 0.0–1.9)

## 2020-10-29 LAB — COMPREHENSIVE METABOLIC PANEL
ALT: 25 U/L (ref 0–53)
AST: 21 U/L (ref 0–37)
Albumin: 3.7 g/dL (ref 3.5–5.2)
Alkaline Phosphatase: 56 U/L (ref 39–117)
BUN: 16 mg/dL (ref 6–23)
CO2: 29 mEq/L (ref 19–32)
Calcium: 9.1 mg/dL (ref 8.4–10.5)
Chloride: 100 mEq/L (ref 96–112)
Creatinine, Ser: 0.89 mg/dL (ref 0.40–1.50)
GFR: 87.21 mL/min (ref >60.00)
Glucose, Bld: 112 mg/dL — ABNORMAL HIGH (ref 70–99)
Potassium: 4.2 mEq/L (ref 3.5–5.1)
Sodium: 137 mEq/L (ref 135–145)
Total Bilirubin: 0.7 mg/dL (ref 0.2–1.2)
Total Protein: 7.2 g/dL (ref 6.0–8.3)

## 2020-10-29 LAB — CBC WITH DIFFERENTIAL/PLATELET
Basophils Absolute: 0 10*3/uL (ref 0.0–0.1)
Basophils Relative: 0.5 % (ref 0.0–3.0)
Eosinophils Absolute: 0.1 10*3/uL (ref 0.0–0.7)
Eosinophils Relative: 1.2 % (ref 0.0–5.0)
HCT: 42.2 % (ref 39.0–52.0)
Hemoglobin: 14.1 g/dL (ref 13.0–17.0)
Lymphocytes Relative: 31 % (ref 12.0–46.0)
Lymphs Abs: 1.5 10*3/uL (ref 0.7–4.0)
MCHC: 33.4 g/dL (ref 30.0–36.0)
MCV: 85.4 fl (ref 78.0–100.0)
Monocytes Absolute: 0.6 10*3/uL (ref 0.1–1.0)
Monocytes Relative: 13.5 % — ABNORMAL HIGH (ref 3.0–12.0)
Neutro Abs: 2.5 10*3/uL (ref 1.4–7.7)
Neutrophils Relative %: 53.8 % (ref 43.0–77.0)
Platelets: 251 10*3/uL (ref 150.0–400.0)
RBC: 4.94 Mil/uL (ref 4.22–5.81)
RDW: 15.2 % (ref 11.5–15.5)
WBC: 4.7 10*3/uL (ref 4.0–10.5)

## 2020-10-29 LAB — SEDIMENTATION RATE: Sed Rate: 38 mm/hr — ABNORMAL HIGH (ref 0–20)

## 2020-10-29 LAB — POCT GLYCOSYLATED HEMOGLOBIN (HGB A1C): Hemoglobin A1C: 5.9 % — AB (ref 4.0–5.6)

## 2020-10-29 LAB — CK: Total CK: 157 U/L (ref 7–232)

## 2020-10-29 LAB — LIPID PANEL
Cholesterol: 150 mg/dL (ref 0–200)
HDL: 46 mg/dL (ref >39.00)
LDL Cholesterol: 88 mg/dL (ref 0–99)
NonHDL: 104.38
Total CHOL/HDL Ratio: 3
Triglycerides: 84 mg/dL (ref 0.0–149.0)
VLDL: 16.8 mg/dL (ref 0.0–40.0)

## 2020-10-29 LAB — C-REACTIVE PROTEIN: CRP: <1 mg/dL (ref 0.5–20.0)

## 2020-10-29 LAB — IBC + FERRITIN
Ferritin: 76.4 ng/mL (ref 22.0–322.0)
Iron: 57 ug/dL (ref 42–165)
Saturation Ratios: 17.1 % — ABNORMAL LOW (ref 20.0–50.0)
Transferrin: 238 mg/dL (ref 212.0–360.0)

## 2020-10-29 LAB — TSH: TSH: 1.7 u[IU]/mL (ref 0.35–4.50)

## 2020-10-29 LAB — VITAMIN B12: Vitamin B-12: 565 pg/mL (ref 211–911)

## 2020-10-29 LAB — VITAMIN D 25 HYDROXY (VIT D DEFICIENCY, FRACTURES): VITD: 30.21 ng/mL (ref 30.00–100.00)

## 2020-10-29 MED ORDER — SHINGRIX 50 MCG/0.5ML IM SUSR: 0.5000 mL | Freq: Once | INTRAMUSCULAR | 0 refills | Status: AC

## 2020-10-29 NOTE — Progress Notes (Signed)
Javier [name] DOB: Nov 22, 1950 Encounter date: 10/29/2020  This is a 70 y.o. male who presents for complete physical   History of present illness/Additional concerns: (no show December visit for physical)  Sometimes feels a little off balance. Notes at work with going up stairs. Not light headed dizzy. No falls. Works at [employer] and probably notes every day. Sometimes feels weak and feels like it could be med related.   HTN: hydralazine50mg  TID- metoprolol 100mg  daily, olmesartan-amlodipine-hctz 40-10-25, spironolactone 25mg  daily. hasn't checked in awhile.   Enlarged prostate: flomax; we had him switch flomax to morning  But then urinated too much at work.   DMII: we took him off metformin last visit. States eating more of wife's food; rich. Tries to do diet more heavy with protein, fruit.   GERD: just if eating something with more tomato base, or spicy.   HL: stopped taking the lipitor due to bad headaches with medication.  Tolerating the crestor without side effects.   Cologuard was completed 07/25/2019 and was negative.  Repeat in 3 years.  Due for dental appointment; has tooth he needs pulled.  Has had eye exam in last year.   Past Medical History:  Diagnosis Date  . Adverse effect of benzodiazepine 10/15/2011   Klonopin Rx expired    . BENIGN PROSTATIC HYPERTROPHY 02/20/2007  . Depression   . Diabetes mellitus   . ELEVATED PROSTATE SPECIFIC ANTIGEN 02/26/2009  . 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 FASCIITIS, PLANTAR 09/30/2007  . GERD 02/20/2007  . Heart murmur   . HYPERTENSION 02/20/2007  . HYPOKALEMIA 03/02/2009  . Obesity    Past Surgical History:  Procedure Laterality Date  . KNEE SURGERY Bilateral    torn cartiledge   Allergies  Allergen Reactions  . Atorvastatin     headache  . Catapres [Clonidine Hcl]     Vivid dreams and loss of libido  . Pravastatin Sodium     headache   Current Meds  Medication Sig  . hydrALAZINE (APRESOLINE) 50 MG tablet Take  1 tablet (50 mg total) by mouth 3 (three) times daily.  . metoprolol succinate (TOPROL-XL) 100 MG 24 hr tablet TAKE 1 TABLET BY MOUTH WITH OR IMMEDIATELY FOLLOWING A MEAL  . Olmesartan-amLODIPine-HCTZ 40-10-25 MG TABS TAKE 1 TABLET BY MOUTH DAILY  . potassium chloride SA (KLOR-CON) 20 MEQ tablet TAKE 1 TABLET(20 MEQ) BY MOUTH DAILY  . rosuvastatin (CRESTOR) 5 MG tablet Take 1 tablet (5 mg total) by mouth daily.  . sildenafil (VIAGRA) 100 MG tablet Take 0.5-1 tablets (50-100 mg total) by mouth daily as needed for erectile dysfunction.  03/04/2009 spironolactone (ALDACTONE) 25 MG tablet TAKE 1 TABLET BY MOUTH EVERY DAY  . tamsulosin (FLOMAX) 0.4 MG CAPS capsule TAKE 1 CAPSULE BY MOUTH EVERY DAY  . [EXPIRED] Zoster Vaccine Adjuvanted [facility]) injection Inject 0.5 mLs into the muscle once for 1 dose. Repeat in 2-6 months   Social History   Tobacco Use  . Smoking status: Never Smoker  . Smokeless tobacco: Never Used  Substance Use Topics  . Alcohol use: No   Family History  Problem Relation Age of Onset  . Heart failure Mother   . Other Father        no known med history  . High blood pressure Sister   . High blood pressure Sister      Review of Systems  Constitutional: Negative for activity change, appetite change, chills, fatigue, fever and unexpected weight change.  HENT: Negative for congestion, ear  pain, hearing loss, sinus pressure, sinus pain, sore throat and trouble swallowing.   Eyes: Negative for pain and visual disturbance.  Respiratory: Negative for cough, chest tightness, shortness of breath and wheezing.   Cardiovascular: Negative for chest pain, palpitations and leg swelling.       Has noted some breast tenderness bilat. Not sure if med associated or weight gain associated.  Gastrointestinal: Negative for abdominal distention, abdominal pain, blood in stool, constipation, diarrhea, nausea and vomiting.  Genitourinary: Negative for decreased urine volume, difficulty urinating,  dysuria, penile pain and testicular pain.  Musculoskeletal: Negative for arthralgias, back pain and joint swelling.  Skin: Negative for rash.  Neurological: Negative for dizziness, weakness, numbness and headaches.  Hematological: Negative for adenopathy. Does not bruise/bleed easily.  Psychiatric/Behavioral: Negative for agitation, sleep disturbance and suicidal ideas. The patient is not nervous/anxious.     CBC:  Lab Results  Component Value Date   WBC 4.7 10/29/2020   HGB 14.1 10/29/2020   HCT 42.2 10/29/2020   MCH 27.6 04/21/2020   MCHC 33.4 10/29/2020   RDW 15.2 10/29/2020   PLT 251.0 10/29/2020   CMP: Lab Results  Component Value Date   NA 137 10/29/2020   K 4.2 10/29/2020   CL 100 10/29/2020   CO2 29 10/29/2020   ANIONGAP 9 04/21/2020   GLUCOSE 112 (H) 10/29/2020   BUN 16 10/29/2020   CREATININE 0.89 10/29/2020   CREATININE 0.71 07/04/2019   GFRAA >60 04/21/2020   CALCIUM 9.1 10/29/2020   PROT 7.2 10/29/2020   BILITOT 0.7 10/29/2020   ALKPHOS 56 10/29/2020   ALT 25 10/29/2020   AST 21 10/29/2020   LIPID: Lab Results  Component Value Date   CHOL 150 10/29/2020   TRIG 84.0 10/29/2020   HDL 46.00 10/29/2020   LDLCALC 88 10/29/2020    Objective:  BP 130/62 (BP Location: Left Arm, Patient Position: Sitting, Cuff Size: Normal)   Pulse 65   Temp 98.1 F (36.7 C) (Oral)   Ht 5' 7.5" (1.715 m)   Wt 261 lb 8 oz (118.6 kg)   SpO2 95%   BMI 40.35 kg/m   Weight: 261 lb 8 oz (118.6 kg)   BP Readings from Last 3 Encounters:  10/29/20 130/62  04/24/20 (!) 154/63  04/21/20 (!) 147/64   Wt Readings from Last 3 Encounters:  10/29/20 261 lb 8 oz (118.6 kg)  04/21/20 253 lb 8.5 oz (115 kg)  03/12/20 253 lb 9.6 oz (115 kg)    Physical Exam Constitutional:      General: He is not in acute distress.    Appearance: He is well-developed and well-nourished.  HENT:     Head: Normocephalic and atraumatic.     Right Ear: External ear normal.     Left Ear:  External ear normal.     Nose: Nose normal.     Mouth/Throat:     Mouth: Oropharynx is clear and moist.     Pharynx: No oropharyngeal exudate.  Eyes:     Conjunctiva/sclera: Conjunctivae normal.     Pupils: Pupils are equal, round, and reactive to light.  Neck:     Thyroid: No thyromegaly.  Cardiovascular:     Rate and Rhythm: Normal rate and regular rhythm.     Pulses: Intact distal pulses.     Heart sounds: Normal heart sounds. No murmur heard. No friction rub. No gallop.   Pulmonary:     Effort: Pulmonary effort is normal. No respiratory distress.  Breath sounds: Normal breath sounds. No stridor. No wheezing or rales.  Chest:  Breasts:     Right: No axillary adenopathy or supraclavicular adenopathy.     Left: No axillary adenopathy or supraclavicular adenopathy.      Comments: He does have some breast development bilaterally Abdominal:     General: Bowel sounds are normal.     Palpations: Abdomen is soft.  Genitourinary:    Penis: Normal and circumcised.      Testes: Normal.     Prostate: Enlarged. Not tender and no nodules present.     Rectum: Normal.  Musculoskeletal:        General: Normal range of motion.     Cervical back: Neck supple.  Feet:     Comments: Normal monofilament exam Lymphadenopathy:     Upper Body:     Right upper body: No supraclavicular or axillary adenopathy.     Left upper body: No supraclavicular or axillary adenopathy.  Skin:    General: Skin is warm and dry.  Neurological:     Mental Status: He is alert and oriented to person, place, and time.     Cranial Nerves: Cranial nerves are intact.     Sensory: Sensation is intact.     Motor: Motor function is intact.     Deep Tendon Reflexes:     Reflex Scores:      Bicep reflexes are 2+ on the right side and 2+ on the left side.      Brachioradialis reflexes are 2+ on the right side and 2+ on the left side.      Patellar reflexes are 2+ on the right side and 2+ on the left  side. Psychiatric:        Mood and Affect: Mood and affect normal.        Behavior: Behavior normal.        Thought Content: Thought content normal.        Judgment: Judgment normal.     Assessment/Plan: Health Maintenance Due  Topic Date Due  . PNA vac Low Risk Adult (2 of 2 - PPSV23) 07/02/2018  . TETANUS/TDAP  03/03/2019  . INFLUENZA VACCINE  04/18/2020   Health Maintenance reviewed.   1. Preventative health care Discussed importance of regular exercise, portion control. Immunizations are up to date today. - Pneumococcal polysaccharide vaccine 23-valent greater than or equal to 2yo subcutaneous/IM  2. Essential hypertension bp well controlled, but I have concerns with med side effects. Upon review of medications, new onset gynecomastia - could have culprit of spironolactone (he has been on this since at least 2012); and other concern may be that newer hydralazine (which is controlling bp very well) could be associated with his sensation of imbalance/? Sed rate elevated related?) see below. - CBC with Differential/Platelet; Future - Comprehensive metabolic panel; Future - Comprehensive metabolic panel - CBC with Differential/Platelet  3. Gastroesophageal reflux disease without esophagitis Only flares when eats triggering foods. Diet controlled.  4. Hyperglycemia Sugar has been well controlled off of medications. - HM DIABETES FOOT EXAM - Microalbumin / creatinine urine ratio; Future - POC HgB A1c - Microalbumin / creatinine urine ratio  5. Benign prostatic hyperplasia, unspecified whether lower urinary tract symptoms present Discussed moving flomax to right when he gets home from work. Hoping this helps with night time awakening.   6. Vitamin D deficiency Will recheck levels. - VITAMIN D 25 Hydroxy (Vit-D Deficiency, Fractures); Future - VITAMIN D 25 Hydroxy (Vit-D Deficiency, Fractures)  7.  B12 deficiency - Vitamin B12; Future - Vitamin B12  8. Dizziness Start  with bloodwork. May be related to newer medications - hydralazine or crestor. I am going to have him start with holding crestor to see if that helps with symptoms. If no improvement we will rethink bp medications/adjust. - TSH; Future - Sedimentation rate; Future - C-reactive protein; Future - CK; Future - IBC + Ferritin; Future - IBC + Ferritin - CK - C-reactive protein - Sedimentation rate - TSH  9. Hyperlipidemia, unspecified hyperlipidemia type On crestor; well controlled. See above.  - Lipid panel; Future - Lipid panel  10. Gynecomastia Could be related to weight gain. Could be related to crestor or spironolactone. We will try to address cause in step wise fashion.    Return for pending bloodwork.  Theodis Shove, MD

## 2020-10-29 NOTE — Patient Instructions (Addendum)
You can get the Tdap (tetanus) at the pharmacy as well as shingles vaccine (prescription printed).

## 2020-11-01 ENCOUNTER — Telehealth: Payer: Self-pay | Admitting: *Deleted

## 2020-11-01 NOTE — Telephone Encounter (Signed)
-----   Message from Wynn Banker, MD sent at 10/31/2020  4:57 PM EST ----- I ordered pneumonia shot (23) on him but looking back I didn't message you and it's not completed so I'm guessing it wasn't done. I'll delete it, but could you let him know he can get this at the pharmacy as well? Just make sure to tell him complete name so he doesn't get the prevnar again.

## 2020-11-01 NOTE — Telephone Encounter (Signed)
Left a message for the pt to return my call.  

## 2020-11-16 ENCOUNTER — Other Ambulatory Visit: Payer: Self-pay | Admitting: Family Medicine

## 2020-11-16 DIAGNOSIS — I10 Essential (primary) hypertension: Secondary | ICD-10-CM

## 2020-12-02 ENCOUNTER — Telehealth: Payer: Self-pay | Admitting: Family Medicine

## 2020-12-02 NOTE — Telephone Encounter (Signed)
Pt is returning your call and want a call back. 

## 2020-12-03 NOTE — Telephone Encounter (Signed)
Spoke with the pt and informed him of the message below.  Patient stated he thought he had an injection at [facility] and does not recall the exact name.  Per Benita at [facility], they do not have a record of the pt receiving a Pneumococcal 23 at the pharmacy.  Spoke with the pt and informed him of this and advised him to contact the pharmacy if he prefers to receive the vaccine there.

## 2020-12-03 NOTE — Telephone Encounter (Signed)
Patient also wanted to let Dr Hassan Rowan know he is feeling much better as the lightheadedness and feeling off balance has resolved.  Message sent to PCP.

## 2020-12-04 ENCOUNTER — Emergency Department (HOSPITAL_COMMUNITY): Payer: Commercial Managed Care - PPO

## 2020-12-04 ENCOUNTER — Other Ambulatory Visit: Payer: Self-pay

## 2020-12-04 ENCOUNTER — Emergency Department (HOSPITAL_COMMUNITY)
Admission: EM | Admit: 2020-12-04 | Discharge: 2020-12-04 | Disposition: A | Discharge status: Home / Self Care | Payer: Commercial Managed Care - PPO | Attending: Emergency Medicine | Admitting: Emergency Medicine

## 2020-12-04 ENCOUNTER — Encounter (HOSPITAL_COMMUNITY): Payer: Self-pay

## 2020-12-04 DIAGNOSIS — M542 Cervicalgia: Secondary | ICD-10-CM | POA: Diagnosis not present

## 2020-12-04 DIAGNOSIS — E119 Type 2 diabetes mellitus without complications: Secondary | ICD-10-CM | POA: Insufficient documentation

## 2020-12-04 DIAGNOSIS — I10 Essential (primary) hypertension: Secondary | ICD-10-CM | POA: Diagnosis not present

## 2020-12-04 DIAGNOSIS — R251 Tremor, unspecified: Secondary | ICD-10-CM

## 2020-12-04 DIAGNOSIS — Z79899 Other long term (current) drug therapy: Secondary | ICD-10-CM | POA: Insufficient documentation

## 2020-12-04 DIAGNOSIS — R519 Headache, unspecified: Secondary | ICD-10-CM | POA: Diagnosis present

## 2020-12-04 DIAGNOSIS — E118 Type 2 diabetes mellitus with unspecified complications: Secondary | ICD-10-CM

## 2020-12-04 LAB — CBC WITH DIFFERENTIAL/PLATELET
Abs Immature Granulocytes: 0.02 10*3/uL (ref 0.00–0.07)
Basophils Absolute: 0 10*3/uL (ref 0.0–0.1)
Basophils Relative: 0 %
Eosinophils Absolute: 0 10*3/uL (ref 0.0–0.5)
Eosinophils Relative: 0 %
HCT: 44.1 % (ref 39.0–52.0)
Hemoglobin: 14.2 g/dL (ref 13.0–17.0)
Immature Granulocytes: 0 %
Lymphocytes Relative: 19 %
Lymphs Abs: 0.9 10*3/uL (ref 0.7–4.0)
MCH: 28 pg (ref 26.0–34.0)
MCHC: 32.2 g/dL (ref 30.0–36.0)
MCV: 86.8 fL (ref 80.0–100.0)
Monocytes Absolute: 0.5 10*3/uL (ref 0.1–1.0)
Monocytes Relative: 10 %
Neutro Abs: 3.2 10*3/uL (ref 1.7–7.7)
Neutrophils Relative %: 71 %
Platelets: 248 10*3/uL (ref 150–400)
RBC: 5.08 MIL/uL (ref 4.22–5.81)
RDW: 14.7 % (ref 11.5–15.5)
WBC: 4.6 10*3/uL (ref 4.0–10.5)
nRBC: 0 % (ref 0.0–0.2)

## 2020-12-04 LAB — COMPREHENSIVE METABOLIC PANEL
ALT: 29 U/L (ref 0–44)
AST: 25 U/L (ref 15–41)
Albumin: 3.6 g/dL (ref 3.5–5.0)
Alkaline Phosphatase: 60 U/L (ref 38–126)
Anion gap: 8 (ref 5–15)
BUN: 20 mg/dL (ref 8–23)
CO2: 25 mmol/L (ref 22–32)
Calcium: 9 mg/dL (ref 8.9–10.3)
Chloride: 101 mmol/L (ref 98–111)
Creatinine, Ser: 0.92 mg/dL (ref 0.61–1.24)
GFR, Estimated: >60 mL/min (ref >60)
Glucose, Bld: 172 mg/dL — ABNORMAL HIGH (ref 70–99)
Potassium: 3.8 mmol/L (ref 3.5–5.1)
Sodium: 134 mmol/L — ABNORMAL LOW (ref 135–145)
Total Bilirubin: 0.9 mg/dL (ref 0.3–1.2)
Total Protein: 7.7 g/dL (ref 6.5–8.1)

## 2020-12-04 MED ORDER — HYDRALAZINE HCL 25 MG PO TABS
50.0000 mg | ORAL_TABLET | Freq: Once | ORAL | Status: AC
  Administered 2020-12-04: 50 mg via ORAL
  Filled 2020-12-04: qty 2

## 2020-12-04 NOTE — ED Provider Notes (Signed)
[facility]-EMERGENCY DEPT Provider Note   CSN: [id number] Arrival date & time: 12/04/20  1100     History Chief Complaint  Patient presents with  . Neck Pain  . Shaking    Javier [name] is a 70 y.o. male.  The patient states that he went to bed at 11 PM last night and awoke at approximately 7 AM today.  He states that when he woke up he had a very mild headache that is no longer present.  He states that it was very short-lived.  He also noticed some right neck pain which has been there for about 2 days and is relatively mild.  He also describes feeling disoriented.  When I asked him to clarify, he states that it was a feeling of having a headache and having red eyes.  He denies any changes in his routine.  He has diabetes but does not check his blood sugar.  He is also feeling little bit shaky and anxious all over.  He does have a history of anxiety but denies any recent stressful events.  Finally, he states that he does not sleep well because he has to get up multiple times per night to urinate.  The history is provided by the patient.  Neck Pain Pain location:  R side Quality:  Aching Pain radiates to:  Does not radiate Pain severity:  Mild Pain is:  Same all the time Onset quality:  Gradual Duration:  2 days Timing:  Constant Progression:  Unchanged Chronicity:  New Context: not fall, not lifting a heavy object and not recent injury   Relieved by:  Nothing Worsened by:  Twisting, bending and position Ineffective treatments:  None tried Associated symptoms: headaches   Associated symptoms: no bladder incontinence, no bowel incontinence, no chest pain, no fever, no leg pain, no numbness, no paresis, no photophobia, no syncope, no tingling, no visual change, no weakness and no weight loss        Past Medical History:  Diagnosis Date  . Adverse effect of benzodiazepine 10/15/2011   Klonopin Rx expired    . BENIGN PROSTATIC HYPERTROPHY 02/20/2007  .  Depression   . Diabetes mellitus   . ELEVATED PROSTATE SPECIFIC ANTIGEN 02/26/2009  . 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 FASCIITIS, PLANTAR 09/30/2007  . GERD 02/20/2007  . Heart murmur   . HYPERTENSION 02/20/2007  . HYPOKALEMIA 03/02/2009  . Obesity     Patient Active Problem List   Diagnosis Date Noted  . Type II diabetes mellitus with complication (HCC) 10/04/2015  . Anxiety disorder 06/02/2011  . 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 HYPOKALEMIA 03/02/2009  . ELEVATED PROSTATE SPECIFIC ANTIGEN 02/26/2009  . FASCIITIS, PLANTAR 09/30/2007  . Essential hypertension 02/20/2007  . GERD 02/20/2007  . BPH (benign prostatic hyperplasia) 02/20/2007    Past Surgical History:  Procedure Laterality Date  . KNEE SURGERY Bilateral    torn cartiledge       Family History  Problem Relation Age of Onset  . Heart failure Mother   . Other Father        no known med history  . High blood pressure Sister   . High blood pressure Sister     Social History   Tobacco Use  . Smoking status: Never Smoker  . Smokeless tobacco: Never Used  Substance Use Topics  . Alcohol use: No  . Drug use: No    Home Medications Prior to Admission medications   Medication Sig Start Date  End Date Taking? Authorizing Provider  hydrALAZINE (APRESOLINE) 50 MG tablet Take 1 tablet (50 mg total) by mouth 3 (three) times daily. 09/03/20   Koberlein, Paris Lore, MD  metoprolol succinate (TOPROL-XL) 100 MG 24 hr tablet TAKE 1 TABLET BY MOUTH WITH OR IMMEDIATELY FOLLOWING A MEAL 11/16/20   Koberlein, Junell C, MD  Olmesartan-amLODIPine-HCTZ 40-10-25 MG TABS TAKE 1 TABLET BY MOUTH EVERY DAY 11/16/20   Koberlein, Jannette Spanner C, MD  potassium chloride SA (KLOR-CON) 20 MEQ tablet TAKE 1 TABLET(20 MEQ) BY MOUTH DAILY 10/18/20   Koberlein, Jannette Spanner C, MD  rosuvastatin (CRESTOR) 5 MG tablet Take 1 tablet (5 mg total) by mouth daily. 03/24/20   Wynn Banker, MD  sildenafil (VIAGRA) 100 MG tablet Take 0.5-1 tablets  (50-100 mg total) by mouth daily as needed for erectile dysfunction. 03/12/20   Wynn Banker, MD  spironolactone (ALDACTONE) 25 MG tablet TAKE 1 TABLET BY MOUTH EVERY DAY 11/16/20   Koberlein, Paris Lore, MD  tamsulosin (FLOMAX) 0.4 MG CAPS capsule TAKE 1 CAPSULE BY MOUTH EVERY DAY 09/06/20   Wynn Banker, MD    Allergies    Atorvastatin, Catapres [clonidine hcl], and Pravastatin sodium  Review of Systems   Review of Systems  Constitutional: Negative for chills, fever and weight loss.  HENT: Negative for ear pain and sore throat.   Eyes: Negative for photophobia, pain and visual disturbance.  Respiratory: Negative for cough and shortness of breath.   Cardiovascular: Negative for chest pain, palpitations and syncope.  Gastrointestinal: Negative for abdominal pain, bowel incontinence and vomiting.  Genitourinary: Negative for bladder incontinence, dysuria and hematuria.  Musculoskeletal: Positive for neck pain. Negative for arthralgias and back pain.  Skin: Negative for color change and rash.  Neurological: Positive for headaches. Negative for tingling, seizures, syncope, weakness and numbness.  All other systems reviewed and are negative.   Physical Exam Updated Vital Signs BP (!) 169/61 (BP Location: Left Arm)   Pulse 69   Temp 98.9 F (37.2 C) (Oral)   Resp 18   SpO2 98%   Physical Exam Vitals and nursing note reviewed.  Constitutional:      Appearance: He is well-developed.  HENT:     Head: Normocephalic and atraumatic.  Eyes:     Extraocular Movements: Extraocular movements intact.     Conjunctiva/sclera: Conjunctivae normal.     Pupils: Pupils are equal, round, and reactive to light.  Cardiovascular:     Rate and Rhythm: Normal rate and regular rhythm.     Heart sounds: No murmur heard.   Pulmonary:     Effort: Pulmonary effort is normal. No respiratory distress.     Breath sounds: Normal breath sounds.  Abdominal:     Palpations: Abdomen is soft.      Tenderness: There is no abdominal tenderness.  Musculoskeletal:     Cervical back: Neck supple.  Skin:    General: Skin is warm and dry.     Capillary Refill: Capillary refill takes less than 2 seconds.  Neurological:     General: No focal deficit present.     Mental Status: He is alert. Mental status is at baseline.  Psychiatric:        Mood and Affect: Mood normal.     ED Results / Procedures / Treatments   Labs (all labs ordered are listed, but only abnormal results are displayed) Labs Reviewed  COMPREHENSIVE METABOLIC PANEL - Abnormal; Notable for the following components:      Result Value  Sodium 134 (*)    Glucose, Bld 172 (*)    All other components within normal limits  CBC WITH DIFFERENTIAL/PLATELET    EKG EKG Interpretation  Date/Time:  Saturday December 04 2020 11:37:54 EDT Ventricular Rate:  69 PR Interval:    QRS Duration: 95 QT Interval:  407 QTC Calculation: 436 R Axis:   49 Text Interpretation: Sinus rhythm Borderline prolonged PR interval axis normal no acute ischemia Confirmed by Pieter Partridge ([id number]) on 12/04/2020 1:31:21 PM   Radiology CT Head Wo Contrast  Result Date: 12/04/2020 CLINICAL DATA:  Mental status change, unknown cause. Additional history provided: Lightheadedness since this morning, dizziness. EXAM: CT HEAD WITHOUT CONTRAST TECHNIQUE: Contiguous axial images were obtained from the base of the skull through the vertex without intravenous contrast. COMPARISON:  Prior head CT 10/13/2011. FINDINGS: Brain: Cerebral volume is normal for age. Mild patchy and ill-defined hypoattenuation within the cerebral white matter is nonspecific, but compatible with chronic small vessel ischemic disease. Prominent perivascular space within the inferior right basal ganglia. There is no acute intracranial hemorrhage. No demarcated cortical infarct. No extra-axial fluid collection. No evidence of intracranial mass. No midline shift. Vascular: No hyperdense vessel.   Atherosclerotic calcifications Skull: Normal. Negative for fracture or focal lesion. Sinuses/Orbits: Visualized orbits show no acute finding. Trace bilateral ethmoid and maxillary sinus mucosal thickening at the imaged levels. IMPRESSION: No evidence of acute intracranial abnormality. Mild cerebral white matter chronic small vessel ischemic disease. Minimal bilateral ethmoid and maxillary sinus mucosal thickening at the imaged levels. Electronically Signed   By: Jackey Loge DO   On: 12/04/2020 12:21    Procedures Procedures   Medications Ordered in ED Medications  hydrALAZINE (APRESOLINE) tablet 50 mg (has no administration in time range)    ED Course  I have reviewed the triage vital signs and the nursing notes.  Pertinent labs & imaging results that were available during my care of the patient were reviewed by me and considered in my medical decision making (see chart for details).    MDM Rules/Calculators/A&P                          The patient is 69 years old.  He has hypertension and diabetes.  He presents with neck pain and a feeling of diffuse shakiness.  I think his neck pain is compatible with a musculoskeletal etiology.  No evidence of deep space abscess, compressive lesion, or radicular pain.  No neurologic signs or symptoms.  Pain is worse with movement.  As far as his nonspecific feelings of shakiness, I did consider whether or not his symptoms were consistent with CVA.  However, his symptoms are not focal and are not consistent with intracranial pathology.  I did consider endocrinologic problems.  His blood sugar is slightly elevated but not markedly so.  I considered infection, but he has no infectious sources.  ED work-up was within normal limits.  He will be discharged home with recommendations to follow-up with his primary care doctor. Final Clinical Impression(s) / ED Diagnoses Final diagnoses:  Neck pain  Tremor  Essential hypertension  Type II diabetes mellitus with  complication [facility])    Rx / DC Orders ED Discharge Orders    None       Koleen Distance, MD 12/04/20 1334

## 2020-12-04 NOTE — ED Notes (Signed)
Pt to CT

## 2020-12-04 NOTE — ED Triage Notes (Signed)
Pt presents with c/o neck pain on the right side after waking up this morning. Pt reports he felt like he was confused when he woke up. When asked of the date, pt reports he believes it is March 17th. No obvious neuro deficits noted. Pt reports he just feels overall very "shaky".

## 2020-12-06 NOTE — Telephone Encounter (Signed)
Noted.  Great!

## 2021-02-08 ENCOUNTER — Ambulatory Visit: Payer: Commercial Managed Care - PPO | Admitting: Family Medicine

## 2021-02-08 ENCOUNTER — Other Ambulatory Visit: Payer: Self-pay

## 2021-02-08 ENCOUNTER — Encounter: Payer: Self-pay | Admitting: Family Medicine

## 2021-02-08 VITALS — BP 168/68 | HR 67 | Temp 98.4°F | Wt 262.6 lb

## 2021-02-08 DIAGNOSIS — R0789 Other chest pain: Secondary | ICD-10-CM

## 2021-02-08 LAB — POCT URINALYSIS DIPSTICK
Bilirubin, UA: NEGATIVE
Blood, UA: NEGATIVE
Glucose, UA: NEGATIVE
Ketones, UA: NEGATIVE
Leukocytes, UA: NEGATIVE
Nitrite, UA: NEGATIVE
Protein, UA: NEGATIVE
Spec Grav, UA: >=1.03 — AB (ref 1.010–1.025)
Urobilinogen, UA: 0.2 E.U./dL
pH, UA: 6 (ref 5.0–8.0)

## 2021-02-08 NOTE — Patient Instructions (Signed)
Follow up promptly for any fever or any progressive or persistent pain.

## 2021-02-08 NOTE — Progress Notes (Signed)
Established Patient Office Visit  Subjective:  Patient ID: Javier [name], male    DOB: [birth year]  Age: 70 y.o. MRN: [id number]  CC:  Chief Complaint  Patient presents with  . Flank Pain    R side, x 1 day, very painful, difficult to walk, no known injury    HPI Dallan A Wardlow presents for 1 day history of right side pain.  This is really not quite flank but really more mid axillary line.  Started around 8 to 9 AM yesterday.  Does not recall any injury.  No recent cough or fever.  No urinary symptoms.  No history of kidney stones.  No nausea or vomiting.  No fevers or chills.  No rashes.  No stool changes.  Pain is worse with standing and walking and not painful and sitting still.  Pain is relatively mild.  He has longstanding history of hypertension and is on multiple medications for that and states he is compliant with all.  However, he states he ate some Spam last night for supper and then corned beef today for lunch.  He realizes that both of those were likely very high in sodium and he thinks this may have run his blood pressure up.  Generally this is been fairly well controlled.  Past Medical History:  Diagnosis Date  . Adverse effect of benzodiazepine 10/15/2011   Klonopin Rx expired    . BENIGN PROSTATIC HYPERTROPHY 02/20/2007  . Depression   . Diabetes mellitus   . ELEVATED PROSTATE SPECIFIC ANTIGEN 02/26/2009  . 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 FASCIITIS, PLANTAR 09/30/2007  . GERD 02/20/2007  . Heart murmur   . HYPERTENSION 02/20/2007  . HYPOKALEMIA 03/02/2009  . Obesity     Past Surgical History:  Procedure Laterality Date  . KNEE SURGERY Bilateral    torn cartiledge    Family History  Problem Relation Age of Onset  . Heart failure Mother   . Other Father        no known med history  . High blood pressure Sister   . High blood pressure Sister     Social History   Socioeconomic History  . Marital status: Married    Spouse name: Not on file  . Number  of children: Not on file  . Years of education: Not on file  . Highest education level: Not on file  Occupational History  . Not on file  Tobacco Use  . Smoking status: Never Smoker  . Smokeless tobacco: Never Used  Substance and Sexual Activity  . Alcohol use: No  . Drug use: No  . Sexual activity: Not on file  Other Topics Concern  . Not on file  Social History Narrative  . Not on file   Social Determinants of Health   Financial Resource Strain: Not on file  Food Insecurity: Not on file  Transportation Needs: Not on file  Physical Activity: Not on file  Stress: Not on file  Social Connections: Not on file  Intimate Partner Violence: Not on file    Outpatient Medications Prior to Visit  Medication Sig Dispense Refill  . aspirin EC 81 MG tablet Take 81 mg by mouth daily. Swallow whole.    . hydrALAZINE (APRESOLINE) 50 MG tablet Take 1 tablet (50 mg total) by mouth 3 (three) times daily. 270 tablet 1  . metoprolol succinate (TOPROL-XL) 100 MG 24 hr tablet TAKE 1 TABLET BY MOUTH WITH OR IMMEDIATELY FOLLOWING A MEAL (Patient taking differently: Take 100  mg by mouth daily.) 90 tablet 0  . Olmesartan-amLODIPine-HCTZ 40-10-25 MG TABS TAKE 1 TABLET BY MOUTH EVERY DAY (Patient taking differently: Take 1 tablet by mouth daily.) 90 tablet 0  . potassium chloride SA (KLOR-CON) 20 MEQ tablet TAKE 1 TABLET(20 MEQ) BY MOUTH DAILY (Patient taking differently: Take 20 mEq by mouth daily.) 90 tablet 1  . rosuvastatin (CRESTOR) 5 MG tablet Take 1 tablet (5 mg total) by mouth daily. 90 tablet 1  . sildenafil (VIAGRA) 100 MG tablet Take 0.5-1 tablets (50-100 mg total) by mouth daily as needed for erectile dysfunction. 90 tablet 1  . spironolactone (ALDACTONE) 25 MG tablet TAKE 1 TABLET BY MOUTH EVERY DAY (Patient taking differently: Take 25 mg by mouth daily.) 90 tablet 0  . tamsulosin (FLOMAX) 0.4 MG CAPS capsule TAKE 1 CAPSULE BY MOUTH EVERY DAY (Patient taking differently: Take 0.4 mg by mouth  daily.) 90 capsule 0   No facility-administered medications prior to visit.    Allergies  Allergen Reactions  . Atorvastatin     headache  . Catapres [Clonidine Hcl]     Vivid dreams and loss of libido  . Pravastatin Sodium     headache    ROS Review of Systems  Constitutional: Negative for chills and fever.  Respiratory: Negative for cough and shortness of breath.   Cardiovascular: Negative for chest pain.  Gastrointestinal: Negative for abdominal pain.  Genitourinary: Negative for dysuria.  Skin: Negative for rash.      Objective:    Physical Exam Vitals reviewed.  Constitutional:      Appearance: Normal appearance.  Cardiovascular:     Rate and Rhythm: Normal rate and regular rhythm.  Pulmonary:     Effort: Pulmonary effort is normal.     Breath sounds: Normal breath sounds.     Comments: He has some very mild tenderness to palpation along the right lower rib cage area but no visible overlying ecchymosis or swelling or erythema. Abdominal:     General: There is no distension.     Palpations: Abdomen is soft. There is no mass.     Tenderness: There is no abdominal tenderness. There is no guarding.     Comments: No hepatomegaly or splenomegaly noted.  Musculoskeletal:     Cervical back: Neck supple.  Skin:    Findings: No rash.  Neurological:     Mental Status: He is alert.     BP (!) 168/68 (BP Location: Left Arm, Cuff Size: Large)   Pulse 67   Temp 98.4 F (36.9 C) (Oral)   Wt 262 lb 9.6 oz (119.1 kg)   SpO2 96%   BMI 40.52 kg/m  Wt Readings from Last 3 Encounters:  02/08/21 262 lb 9.6 oz (119.1 kg)  10/29/20 261 lb 8 oz (118.6 kg)  04/21/20 253 lb 8.5 oz (115 kg)     Health Maintenance Due  Topic Date Due  . PNA vac Low Risk Adult (2 of 2 - PPSV23) 07/02/2018  . TETANUS/TDAP  03/03/2019    There are no preventive care reminders to display for this patient.  Lab Results  Component Value Date   TSH 1.70 10/29/2020   Lab Results   Component Value Date   WBC 4.6 12/04/2020   HGB 14.2 12/04/2020   HCT 44.1 12/04/2020   MCV 86.8 12/04/2020   PLT 248 12/04/2020   Lab Results  Component Value Date   NA 134 (L) 12/04/2020   K 3.8 12/04/2020   CO2 25 12/04/2020  GLUCOSE 172 (H) 12/04/2020   BUN 20 12/04/2020   CREATININE 0.92 12/04/2020   BILITOT 0.9 12/04/2020   ALKPHOS 60 12/04/2020   AST 25 12/04/2020   ALT 29 12/04/2020   PROT 7.7 12/04/2020   ALBUMIN 3.6 12/04/2020   CALCIUM 9.0 12/04/2020   ANIONGAP 8 12/04/2020   GFR 87.21 10/29/2020   Lab Results  Component Value Date   CHOL 150 10/29/2020   Lab Results  Component Value Date   HDL 46.00 10/29/2020   Lab Results  Component Value Date   LDLCALC 88 10/29/2020   Lab Results  Component Value Date   TRIG 84.0 10/29/2020   Lab Results  Component Value Date   CHOLHDL 3 10/29/2020   Lab Results  Component Value Date   HGBA1C 5.9 (A) 10/29/2020      Assessment & Plan:   Right chest wall pain.  Urine dipstick is unremarkable.  No blood.  No leukocytes.  No nitrites.  No evidence for skin rash.  The fact that this is worse with movement suggests musculoskeletal origin.  We recommend over-the-counter anti-inflammatory and observation for now.  Follow-up promptly for any fever, rash, or any persistent or worsening pain.  -He is encouraged to reduce sodium intake and follow-up closely with primary provider regarding his hypertension  No orders of the defined types were placed in this encounter.   Follow-up: No follow-ups on file.    Evelena Peat, MD

## 2021-02-14 ENCOUNTER — Other Ambulatory Visit: Payer: Self-pay | Admitting: Family Medicine

## 2021-02-14 DIAGNOSIS — I10 Essential (primary) hypertension: Secondary | ICD-10-CM

## 2021-02-18 ENCOUNTER — Telehealth: Payer: Self-pay | Admitting: Family Medicine

## 2021-02-18 LAB — HM DIABETES EYE EXAM

## 2021-02-18 NOTE — Telephone Encounter (Signed)
Pt is calling in needing a refill on olmesartan-amlodipine-HCTZ 40-10-25 MG #90, hydralazine (APRESOLINE) 50 MG #90,sprironolactone (ALDACTONE) 25 MG #90, metoprolol succinate (TOPROL-XL) 100 MG #90, potassium chloride SA (KLOR-CON) 20 MEQ and sildenafil (VIAGRA) 100 MG.  Pharm:  [facility] [address].  Pt is requesting these due to his insurance is changing in a few weeks and did not want to get caught not having them while they are in transition of insurances.

## 2021-02-21 MED ORDER — HYDRALAZINE HCL 50 MG PO TABS: 50.0000 mg | ORAL_TABLET | Freq: Three times a day (TID) | ORAL | 1 refills | Status: DC

## 2021-02-21 MED ORDER — POTASSIUM CHLORIDE CRYS ER 20 MEQ PO TBCR: EXTENDED_RELEASE_TABLET | ORAL | 1 refills | Status: DC

## 2021-02-21 MED ORDER — SILDENAFIL CITRATE 100 MG PO TABS: 50.0000 mg | ORAL_TABLET | Freq: Every day | ORAL | 0 refills | Status: DC | PRN

## 2021-02-21 NOTE — Telephone Encounter (Signed)
Rxs sent for Olmesartan-amlodipine-HCTZ,  Potassium and Sildenafil only as refills for the other medications were previously sent on 5/30.

## 2021-02-21 NOTE — Telephone Encounter (Signed)
Rx also sent in for Hydralazine.

## 2021-02-21 NOTE — Addendum Note (Signed)
Addended by: Johnella Moloney on: 02/21/2021 10:50 AM   Modules accepted: Orders

## 2021-03-13 ENCOUNTER — Other Ambulatory Visit: Payer: Self-pay | Admitting: Family Medicine

## 2021-03-13 DIAGNOSIS — I10 Essential (primary) hypertension: Secondary | ICD-10-CM

## 2021-04-26 ENCOUNTER — Encounter: Payer: Self-pay | Admitting: Family Medicine

## 2021-05-07 ENCOUNTER — Other Ambulatory Visit: Payer: Self-pay | Admitting: Family Medicine

## 2021-05-07 DIAGNOSIS — I10 Essential (primary) hypertension: Secondary | ICD-10-CM

## 2021-08-14 ENCOUNTER — Other Ambulatory Visit: Payer: Self-pay | Admitting: Family Medicine

## 2021-08-14 DIAGNOSIS — I10 Essential (primary) hypertension: Secondary | ICD-10-CM

## 2021-12-03 ENCOUNTER — Other Ambulatory Visit: Payer: Self-pay | Admitting: Family Medicine

## 2021-12-03 DIAGNOSIS — I10 Essential (primary) hypertension: Secondary | ICD-10-CM

## 2021-12-03 NOTE — Telephone Encounter (Signed)
90 days sent. Patient hasn't been seen in over a year. Will need to follow up before further refills. ?

## 2021-12-19 ENCOUNTER — Telehealth: Payer: Self-pay | Admitting: Family Medicine

## 2021-12-19 DIAGNOSIS — I10 Essential (primary) hypertension: Secondary | ICD-10-CM

## 2021-12-19 MED ORDER — TAMSULOSIN HCL 0.4 MG PO CAPS: 0.4000 mg | ORAL_CAPSULE | Freq: Every day | ORAL | 0 refills | Status: DC

## 2021-12-19 NOTE — Telephone Encounter (Signed)
Patient called in requesting a medication refill for tamsulosin (FLOMAX) 0.4 MG CAPS capsule [[id number]]  to be sent to his pharmacy. ? ?Please advise. ?

## 2021-12-19 NOTE — Telephone Encounter (Signed)
Rx done. 

## 2022-01-27 ENCOUNTER — Ambulatory Visit (INDEPENDENT_AMBULATORY_CARE_PROVIDER_SITE_OTHER): Payer: Commercial Managed Care - PPO | Admitting: Family Medicine

## 2022-01-27 ENCOUNTER — Encounter: Payer: Self-pay | Admitting: Family Medicine

## 2022-01-27 ENCOUNTER — Other Ambulatory Visit: Payer: Self-pay | Admitting: Family Medicine

## 2022-01-27 ENCOUNTER — Other Ambulatory Visit: Payer: Self-pay

## 2022-01-27 VITALS — BP 138/52 | HR 68 | Temp 98.7°F | Ht 67.5 in | Wt 270.2 lb

## 2022-01-27 DIAGNOSIS — N400 Enlarged prostate without lower urinary tract symptoms: Secondary | ICD-10-CM

## 2022-01-27 DIAGNOSIS — I10 Essential (primary) hypertension: Secondary | ICD-10-CM | POA: Diagnosis not present

## 2022-01-27 DIAGNOSIS — M25551 Pain in right hip: Secondary | ICD-10-CM | POA: Diagnosis not present

## 2022-01-27 DIAGNOSIS — R5383 Other fatigue: Secondary | ICD-10-CM

## 2022-01-27 DIAGNOSIS — Z23 Encounter for immunization: Secondary | ICD-10-CM | POA: Diagnosis not present

## 2022-01-27 DIAGNOSIS — B351 Tinea unguium: Secondary | ICD-10-CM

## 2022-01-27 DIAGNOSIS — E785 Hyperlipidemia, unspecified: Secondary | ICD-10-CM

## 2022-01-27 DIAGNOSIS — M25552 Pain in left hip: Secondary | ICD-10-CM | POA: Diagnosis not present

## 2022-01-27 DIAGNOSIS — Z0000 Encounter for general adult medical examination without abnormal findings: Secondary | ICD-10-CM | POA: Diagnosis not present

## 2022-01-27 DIAGNOSIS — E118 Type 2 diabetes mellitus with unspecified complications: Secondary | ICD-10-CM | POA: Diagnosis not present

## 2022-01-27 DIAGNOSIS — R350 Frequency of micturition: Secondary | ICD-10-CM

## 2022-01-27 LAB — CBC WITH DIFFERENTIAL/PLATELET
Basophils Absolute: 0 10*3/uL (ref 0.0–0.1)
Basophils Relative: 0.5 % (ref 0.0–3.0)
Eosinophils Absolute: 0.1 10*3/uL (ref 0.0–0.7)
Eosinophils Relative: 1.1 % (ref 0.0–5.0)
HCT: 40.4 % (ref 39.0–52.0)
Hemoglobin: 13.4 g/dL (ref 13.0–17.0)
Lymphocytes Relative: 25.6 % (ref 12.0–46.0)
Lymphs Abs: 1.5 10*3/uL (ref 0.7–4.0)
MCHC: 33.2 g/dL (ref 30.0–36.0)
MCV: 86 fl (ref 78.0–100.0)
Monocytes Absolute: 0.8 10*3/uL (ref 0.1–1.0)
Monocytes Relative: 13.2 % — ABNORMAL HIGH (ref 3.0–12.0)
Neutro Abs: 3.6 10*3/uL (ref 1.4–7.7)
Neutrophils Relative %: 59.6 % (ref 43.0–77.0)
Platelets: 263 10*3/uL (ref 150.0–400.0)
RBC: 4.7 Mil/uL (ref 4.22–5.81)
RDW: 14.8 % (ref 11.5–15.5)
WBC: 6 10*3/uL (ref 4.0–10.5)

## 2022-01-27 LAB — MICROALBUMIN / CREATININE URINE RATIO
Creatinine,U: 110.3 mg/dL
Microalb Creat Ratio: 0.6 mg/g (ref 0.0–30.0)
Microalb, Ur: <0.7 mg/dL (ref 0.0–1.9)

## 2022-01-27 LAB — LIPID PANEL
Cholesterol: 161 mg/dL (ref 0–200)
HDL: 45 mg/dL (ref >39.00)
LDL Cholesterol: 87 mg/dL (ref 0–99)
NonHDL: 116.12
Total CHOL/HDL Ratio: 4
Triglycerides: 145 mg/dL (ref 0.0–149.0)
VLDL: 29 mg/dL (ref 0.0–40.0)

## 2022-01-27 LAB — COMPREHENSIVE METABOLIC PANEL
ALT: 21 U/L (ref 0–53)
AST: 21 U/L (ref 0–37)
Albumin: 3.8 g/dL (ref 3.5–5.2)
Alkaline Phosphatase: 61 U/L (ref 39–117)
BUN: 21 mg/dL (ref 6–23)
CO2: 27 mEq/L (ref 19–32)
Calcium: 9.2 mg/dL (ref 8.4–10.5)
Chloride: 101 mEq/L (ref 96–112)
Creatinine, Ser: 1.09 mg/dL (ref 0.40–1.50)
GFR: 68.47 mL/min (ref >60.00)
Glucose, Bld: 100 mg/dL — ABNORMAL HIGH (ref 70–99)
Potassium: 3.9 mEq/L (ref 3.5–5.1)
Sodium: 136 mEq/L (ref 135–145)
Total Bilirubin: 0.5 mg/dL (ref 0.2–1.2)
Total Protein: 7.4 g/dL (ref 6.0–8.3)

## 2022-01-27 LAB — VITAMIN B12: Vitamin B-12: 691 pg/mL (ref 211–911)

## 2022-01-27 LAB — TSH: TSH: 1.84 u[IU]/mL (ref 0.35–5.50)

## 2022-01-27 LAB — HEMOGLOBIN A1C: Hgb A1c MFr Bld: 6.2 % (ref 4.6–6.5)

## 2022-01-27 LAB — SEDIMENTATION RATE: Sed Rate: 68 mm/hr — ABNORMAL HIGH (ref 0–20)

## 2022-01-27 MED ORDER — SILDENAFIL CITRATE 100 MG PO TABS: 50.0000 mg | ORAL_TABLET | Freq: Every day | ORAL | 1 refills | Status: DC | PRN

## 2022-01-27 MED ORDER — MIRABEGRON ER 25 MG PO TB24: 25.0000 mg | ORAL_TABLET | Freq: Every day | ORAL | 0 refills | Status: DC

## 2022-01-27 NOTE — Progress Notes (Signed)
?[name] ?DOB: Nov 29, 1950 ?Encounter date: 01/27/2022 ? ?This is a 71 y.o. male who presents with ?Chief Complaint  ?Patient presents with  ? Annual Exam  ?  Needs a new Rx for sildenafil   ? ? ?History of present illness: ?Last visit with me was 10/29/2020. ? ?States that he does feel tired sometime. Thinks med related, but also getting up at least 4 times at night to urinate due to tamsulosin. If he takes in morning then he urinates too much at work. Without the med then starting stream and emptying is an issue. States that the [place] gave him a sample of something for the bladder irritability but he didn't try to take this. Might have been oxybutynin. ? ?Hypertension: Hydralazine 50 mg 3 times daily, Toprol 100 mg daily, olmesartan amlodipine hydrochlorothiazide 40 - 10 - 25 daily, spironolactone 25 mg daily.  Echo was completed in 2021 and was relatively stable with LVH noted.  Sleep study was suggested by cardiology for further evaluation, but I do not see that this was completed and he has not followed up with cardiology since 2020.  He was referred due to difficulty with management of blood pressure. ? ?Type 2 diabetes: Has been diet controlled. States that eating habits have changed and not walking as much. Feels stiff in both hips. This started about a year ago. Wife tends to cook rich foods and he feels inclined to eat these.  ? ?Hyperlipidemia: Difficulty with tolerating Lipitor in the past, currently on Crestor. ? ?Cologuard due again in 07/2022. ? ?Feels weak overall and feels that it may be med related.  ? ? ?Allergies  ?Allergen Reactions  ? Atorvastatin   ?  headache  ? Catapres [Clonidine Hcl]   ?  Vivid dreams and loss of libido  ? Pravastatin Sodium   ?  headache  ? ?Current Meds  ?Medication Sig  ? aspirin EC 81 MG tablet Take 81 mg by mouth daily. Swallow whole.  ? hydrALAZINE (APRESOLINE) 50 MG tablet Take 1 tablet (50 mg total) by mouth 3 (three) times daily.  ? metoprolol succinate  (TOPROL-XL) 100 MG 24 hr tablet TAKE 1 TABLET BY MOUTH EVERY DAY  ? Olmesartan-amLODIPine-HCTZ 40-10-25 MG TABS TAKE 1 TABLET BY MOUTH EVERY DAY  ? potassium chloride SA (KLOR-CON M20) 20 MEQ tablet TAKE 1 TABLET BY MOUTH EVERY DAY  ? sildenafil (VIAGRA) 100 MG tablet Take 0.5-1 tablets (50-100 mg total) by mouth daily as needed for erectile dysfunction.  ? spironolactone (ALDACTONE) 25 MG tablet TAKE 1 TABLET BY MOUTH EVERY DAY  ? tamsulosin (FLOMAX) 0.4 MG CAPS capsule Take 1 capsule (0.4 mg total) by mouth daily.  ? [DISCONTINUED] rosuvastatin (CRESTOR) 5 MG tablet Take 1 tablet (5 mg total) by mouth daily.  ? ? ?Review of Systems  ?Constitutional:  Negative for activity change, appetite change, chills, fatigue, fever and unexpected weight change.  ?HENT:  Negative for congestion, ear pain, hearing loss, sinus pressure, sinus pain, sore throat and trouble swallowing.   ?Eyes:  Negative for pain and visual disturbance.  ?Respiratory:  Negative for cough, chest tightness, shortness of breath and wheezing.   ?Cardiovascular:  Negative for chest pain, palpitations and leg swelling.  ?Gastrointestinal:  Negative for abdominal distention, abdominal pain, blood in stool, constipation, diarrhea, nausea and vomiting.  ?Genitourinary:  Negative for decreased urine volume, difficulty urinating, dysuria, penile pain and testicular pain.  ?Musculoskeletal:  Negative for arthralgias, back pain and joint swelling.  ?Skin:  Negative for  rash.  ?Neurological:  Negative for dizziness, weakness, numbness and headaches.  ?Hematological:  Negative for adenopathy. Does not bruise/bleed easily.  ?Psychiatric/Behavioral:  Negative for agitation, sleep disturbance and suicidal ideas. The patient is not nervous/anxious.   ? ?Objective: ? ?BP 138/76   Pulse 68   Temp 98.7 ?F (37.1 ?C) (Oral)   Ht 5' 7.5" (1.715 m)   Wt 270 lb 3 oz (122.6 kg)   SpO2 97%   BMI 41.69 kg/m?   Weight: 270 lb 3 oz (122.6 kg)  ? ?BP Readings from Last 3  Encounters:  ?01/27/22 138/76  ?02/08/21 (!) 168/68  ?12/04/20 (!) 156/62  ? ?Wt Readings from Last 3 Encounters:  ?01/27/22 270 lb 3 oz (122.6 kg)  ?02/08/21 262 lb 9.6 oz (119.1 kg)  ?10/29/20 261 lb 8 oz (118.6 kg)  ? ? ?Physical Exam ?Constitutional:   ?   General: He is not in acute distress. ?   Appearance: He is well-developed.  ?HENT:  ?   Head: Normocephalic and atraumatic.  ?   Right Ear: External ear normal.  ?   Left Ear: External ear normal.  ?   Nose: Nose normal.  ?   Mouth/Throat:  ?   Pharynx: No oropharyngeal exudate.  ?Eyes:  ?   Conjunctiva/sclera: Conjunctivae normal.  ?   Pupils: Pupils are equal, round, and reactive to light.  ?Neck:  ?   Thyroid: No thyromegaly.  ?Cardiovascular:  ?   Rate and Rhythm: Normal rate and regular rhythm.  ?   Heart sounds: Murmur heard.  ?Systolic murmur is present with a grade of 2/6.  ?  No friction rub. No gallop.  ?   Comments: Normal diabetic foot exam, onychomycosis great toes bilat ?Pulmonary:  ?   Effort: Pulmonary effort is normal. No respiratory distress.  ?   Breath sounds: Normal breath sounds. No stridor. No wheezing or rales.  ?Abdominal:  ?   General: Bowel sounds are normal.  ?   Palpations: Abdomen is soft.  ?Musculoskeletal:     ?   General: Normal range of motion.  ?   Cervical back: Neck supple.  ?   Right lower leg: No edema.  ?   Left lower leg: No edema.  ?Skin: ?   General: Skin is warm and dry.  ?Neurological:  ?   Mental Status: He is alert and oriented to person, place, and time.  ?Psychiatric:     ?   Behavior: Behavior normal.     ?   Thought Content: Thought content normal.     ?   Judgment: Judgment normal.  ? ? ?Assessment/Plan ? ?1. Essential hypertension ?Encourage patient to check blood pressure at home.  Systolic higher than desired and diastolic lower than desired.  Blood pressures been difficult to control in the past, and current numbers are best he has been, but we discussed that with the diastolic too low he may feel more  fatigued.  Follow-up pending blood work and patient response. ?- CBC with Differential/Platelet; Future ?- Comprehensive metabolic panel; Future ?- Comprehensive metabolic panel ?- CBC with Differential/Platelet ? ?2. Type II diabetes mellitus with complication (HCC) ?Has been diet controlled.  Recheck blood work today. ?- Hemoglobin A1c; Future ?- HM DIABETES FOOT EXAM ?- Microalbumin / creatinine urine ratio; Future ?- Ambulatory referral to Podiatry ?- Microalbumin / creatinine urine ratio ?- Hemoglobin A1c ? ?3. Benign prostatic hyperplasia, unspecified whether lower urinary tract symptoms present ?Uses Flomax nightly since he is  not able to tolerate in the day due to increased daytime urinary frequency.  He was given a bladder medication from the [place] at his last visit, but does not recall what this is.  Myrbetriq sample was given to him today (25 mg) and he will let me know how he does with this.  This may not be covered through insurance, but we can try something else if not covered.  We will also check urinalysis to make sure no signs of infection. ? ?4. Bilateral hip pain ?Pain has significantly decreased his activity level and seems to be very symmetric.  Concern for polymyalgia. ?- Sedimentation rate; Future ?- Sedimentation rate ? ?5. Urinary frequency ?- Urine Culture; Future ?- Urine Culture ? ?6. Hyperlipidemia, unspecified hyperlipidemia type ?- Lipid panel; Future ?- TSH; Future ?- TSH ?- Lipid panel ? ?7. Fatigue, unspecified type ?- Vitamin B12; Future ?- Vitamin B12 ? ?8. Onychomycosis ?- Ambulatory referral to Podiatry ? ?9. Need for shingles vaccine ?- Varicella-zoster vaccine IM ? ?10. Need for pneumococcal 20-valent conjugate vaccination ?- Pneumococcal conjugate vaccine 20-valent (Prevnar 20) ? ? ?Return for pending lab or imaging results. ? ? ? ? ?Micheline Rough, MD ?

## 2022-01-28 LAB — URINE CULTURE
MICRO NUMBER:: 13389251
Result:: NO GROWTH
SPECIMEN QUALITY:: ADEQUATE

## 2022-01-29 ENCOUNTER — Other Ambulatory Visit: Payer: Self-pay | Admitting: Family Medicine

## 2022-01-29 MED ORDER — PREDNISONE 20 MG PO TABS: ORAL_TABLET | ORAL | 0 refills | Status: DC

## 2022-02-02 MED ORDER — ROSUVASTATIN CALCIUM 5 MG PO TABS: 5.0000 mg | ORAL_TABLET | Freq: Every day | ORAL | 1 refills | Status: DC

## 2022-02-02 NOTE — Addendum Note (Signed)
Addended by: Johnella Moloney on: 02/02/2022 09:02 AM   Modules accepted: Orders

## 2022-02-11 ENCOUNTER — Ambulatory Visit (HOSPITAL_COMMUNITY)
Admission: EM | Admit: 2022-02-11 | Discharge: 2022-02-11 | Disposition: A | Discharge status: Home / Self Care | Payer: Commercial Managed Care - PPO | Attending: Internal Medicine | Admitting: Internal Medicine

## 2022-02-11 DIAGNOSIS — R531 Weakness: Secondary | ICD-10-CM

## 2022-02-11 DIAGNOSIS — R109 Unspecified abdominal pain: Secondary | ICD-10-CM | POA: Diagnosis not present

## 2022-02-11 DIAGNOSIS — R110 Nausea: Secondary | ICD-10-CM | POA: Diagnosis not present

## 2022-02-11 LAB — CBG MONITORING, ED: Glucose-Capillary: 118 mg/dL — ABNORMAL HIGH (ref 70–99)

## 2022-02-11 MED ORDER — ONDANSETRON HCL 4 MG PO TABS: 4.0000 mg | ORAL_TABLET | Freq: Three times a day (TID) | ORAL | 0 refills | Status: DC | PRN

## 2022-02-11 NOTE — ED Triage Notes (Signed)
Pt reports feel weak ,dizzy ,ABD cramping, loose stools . Pt reports PCP took him off his Diabteic meds. Pt last CBG at home was 160.

## 2022-02-11 NOTE — Discharge Instructions (Addendum)
Advised to increase fluid intake, nutritional diet. Advised to eat regular meals, small snack in between. Take medication as directed. Follow-up with PCP or return to urgent care if symptoms fail to improve.

## 2022-02-11 NOTE — ED Provider Notes (Signed)
[facility]    CSN: [id number] Arrival date & time: 02/11/22  1514      History   Chief Complaint Chief Complaint  Patient presents with   Dizziness   Weakness   Shaking    HPI Javier [name] is a 71 y.o. male.   71 year old male presents with nausea, fatigue, weakness, and shaking.  Patient relates he is a diabetic and he is followed by his PCP on a regular basis.  Patient relates that he has been taken off metformin due to his glucose coming under control.  Patient relates that his hemoglobin A1c on last check several weeks ago was 5.7.  Patient relates over the past 3 days he has been having some intermittent episodes of abdominal cramping, nausea without vomiting.  Patient relates that he is episodes have been intermittently constant.  Patient does not know if he has been around someone that is been sick or if he ate some food that just did not agree with him.  Patient relates he is also had some loose bowels, borderline on diarrhea, multiple episodes over the past couple days but is improved.  Patient relates he has been having episodes of weakness and shakiness which tends to come and go, but tends to improve when he eats a little food.  Patient did have his glucose check today which was at 118.  Patient relates he is not having any fever or chills, no shortness of breath or chest pain, and he is able to drink fluids normally.  Patient indicates he has not been around anybody that he knows with any stomach viruses or that he has been sick.   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    FASCIITIS, PLANTAR 09/30/2007   GERD 02/20/2007   Heart murmur    HYPERTENSION 02/20/2007   HYPOKALEMIA 03/02/2009   Obesity     Patient Active Problem List   Diagnosis Date Noted   Type II diabetes mellitus with complication (HCC) 10/04/2015   Anxiety disorder 06/02/2011   ERECTILE DYSFUNCTION, 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 Authorizing Provider  ondansetron (ZOFRAN) 4 MG tablet Take 1 tablet (4 mg total) by mouth every 8 (eight) hours as needed for nausea or vomiting. 02/11/22  Yes Ellsworth Lennox, PA-C  aspirin EC 81 MG tablet Take 81 mg by mouth daily. Swallow whole.    [provider]  hydrALAZINE (APRESOLINE) 50 MG tablet TAKE 1 TABLET BY MOUTH THREE TIMES A DAY 01/27/22   Koberlein, Jannette Spanner C, MD  metoprolol succinate (TOPROL-XL) 100 MG 24 hr tablet TAKE 1 TABLET BY MOUTH EVERY DAY 12/03/21   Koberlein, Paris Lore, MD  mirabegron ER (MYRBETRIQ) 25 MG TB24 tablet Take 1 tablet (  25 mg total) by mouth daily. 01/27/22   Koberlein, Paris Lore, MD  Olmesartan-amLODIPine-HCTZ 40-10-25 MG TABS TAKE 1 TABLET BY MOUTH EVERY DAY 12/03/21   Koberlein, Jannette Spanner C, MD  potassium chloride SA (KLOR-CON M20) 20 MEQ tablet TAKE 1 TABLET BY MOUTH EVERY DAY 12/03/21   Koberlein, Paris Lore, MD  predniSONE (DELTASONE) 20 MG tablet 3 tablets PO daily x 2 days, then 2 tablet PO daily x 5 days, then 1 tablet daily x 3 days, then 1/2 tablet daily x 2 days 01/29/22   Wynn Banker, MD  rosuvastatin (CRESTOR) 5 MG tablet Take 1 tablet (5 mg total) by mouth daily. 02/02/22   Wynn Banker, MD  sildenafil (VIAGRA)  100 MG tablet Take 0.5-1 tablets (50-100 mg total) by mouth daily as needed for erectile dysfunction. 01/27/22   Wynn Banker, MD  spironolactone (ALDACTONE) 25 MG tablet TAKE 1 TABLET BY MOUTH EVERY DAY 12/03/21   Wynn Banker, MD  tamsulosin (FLOMAX) 0.4 MG CAPS capsule TAKE 1 CAPSULE BY MOUTH EVERY DAY 01/27/22   Wynn Banker, MD    Family History Family History  Problem Relation Age of Onset   Heart failure Mother    Other Father        no known med history   High blood pressure Sister    High blood pressure Sister     Social History Social History   Tobacco Use   Smoking status: Never   Smokeless tobacco: Never  Substance Use Topics   Alcohol use: No   Drug use: No     Allergies   Atorvastatin, Catapres [clonidine hcl], and Pravastatin sodium   Review of Systems Review of Systems  Gastrointestinal:  Positive for abdominal pain (cramping), diarrhea and nausea.  Neurological:  Positive for dizziness and weakness.    Physical Exam Triage Vital Signs ED Triage Vitals  Enc Vitals Group     BP 02/11/22 1533 (!) 147/62     Pulse Rate 02/11/22 1533 64     Resp 02/11/22 1533 20     Temp 02/11/22 1533 98.2 F (36.8 C)     Temp src --      SpO2 02/11/22 1533 99 %     Weight --      Height --      Head Circumference --      Peak Flow --      Pain Score 02/11/22 1632 0     Pain Loc --      Pain Edu? --      Excl. in GC? --    No data found.  Updated Vital Signs BP (!) 147/62   Pulse 64   Temp 98.2 F (36.8 C)   Resp 20   SpO2 99%   Visual Acuity Right Eye Distance:   Left Eye Distance:   Bilateral Distance:    Right Eye Near:   Left Eye Near:    Bilateral Near:     Physical Exam Constitutional:      Appearance: Normal appearance.  HENT:     Mouth/Throat:     Mouth: Mucous membranes are moist.     Pharynx: Oropharynx is clear.  Cardiovascular:     Rate and Rhythm: Normal rate and regular rhythm.     Heart sounds: Normal  heart sounds.  Pulmonary:     Effort: Pulmonary effort is normal.     Breath sounds: Normal air entry. Wheezing present. No rhonchi or rales.  Abdominal:  General: Abdomen is flat. Bowel sounds are increased.     Palpations: Abdomen is soft.  Lymphadenopathy:     Cervical: No cervical adenopathy.  Neurological:     Mental Status: He is alert.     UC Treatments / Results  Labs (all labs ordered are listed, but only abnormal results are displayed) Labs Reviewed  CBG MONITORING, ED - Abnormal; Notable for the following components:      Result Value   Glucose-Capillary 118 (*)    All other components within normal limits    EKG   Radiology No results found.  Procedures Procedures (including critical care time)  Medications Ordered in UC Medications - No data to display  Initial Impression / Assessment and Plan / UC Course  I have reviewed the triage vital signs and the nursing notes.  Pertinent labs & imaging results that were available during my care of the patient were reviewed by me and considered in my medical decision making (see chart for details).    Plan: 1.  Patient advised to take medications on Zofran 1 every 6 hours as needed for nausea, stomach cramping. 2.  Patient has been advised to have a bland diet and increase fluid intake. 3 patient advised to follow-up with PCP or return to urgent care if symptoms fail to improve over the next several days. Final Clinical Impressions(s) / UC Diagnoses   Final diagnoses:  Weakness  Nausea without vomiting  Abdominal cramping     Discharge Instructions      Advised to increase fluid intake, nutritional diet. Advised to eat regular meals, small snack in between. Take medication as directed. Follow-up with PCP or return to urgent care if symptoms fail to improve.    ED Prescriptions     Medication Sig Dispense Auth. Provider   ondansetron (ZOFRAN) 4 MG tablet Take 1 tablet (4 mg total) by mouth every  8 (eight) hours as needed for nausea or vomiting. 20 tablet Ellsworth LennoxJames, Cambrey Lupi, PA-C      PDMP not reviewed this encounter.   Ellsworth LennoxJames, Makinzie Considine, PA-C 02/11/22 1638

## 2022-02-11 NOTE — ED Triage Notes (Signed)
CBG 118 

## 2022-02-17 ENCOUNTER — Ambulatory Visit: Payer: Medicare Other | Admitting: Podiatry

## 2022-03-03 ENCOUNTER — Encounter: Payer: Self-pay | Admitting: Family

## 2022-03-03 ENCOUNTER — Ambulatory Visit: Payer: Commercial Managed Care - PPO | Admitting: Family

## 2022-03-03 VITALS — BP 122/60 | HR 60 | Temp 98.3°F | Ht 67.5 in | Wt 259.7 lb

## 2022-03-03 DIAGNOSIS — E118 Type 2 diabetes mellitus with unspecified complications: Secondary | ICD-10-CM | POA: Diagnosis not present

## 2022-03-03 DIAGNOSIS — R5383 Other fatigue: Secondary | ICD-10-CM

## 2022-03-03 DIAGNOSIS — N529 Male erectile dysfunction, unspecified: Secondary | ICD-10-CM | POA: Diagnosis not present

## 2022-03-03 DIAGNOSIS — I10 Essential (primary) hypertension: Secondary | ICD-10-CM

## 2022-03-03 LAB — TESTOSTERONE: Testosterone: 147.93 ng/dL — ABNORMAL LOW (ref 300.00–890.00)

## 2022-03-03 MED ORDER — SILDENAFIL CITRATE 100 MG PO TABS: 50.0000 mg | ORAL_TABLET | Freq: Every day | ORAL | 1 refills | Status: DC | PRN

## 2022-03-07 NOTE — Progress Notes (Signed)
Acute Office Visit  Subjective:     Patient ID: Javier [name], male    DOB: Jun 16, 1951, 71 y.o.   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 Patient has a history of HLD, Erectile Dysfunction, Type 2 DM, and HTN. Tolerating medication well.  Thinks his fatigue is from not sleeping well.  Patient also requesting a refill on his Sildenafil.   Review of Systems  Constitutional:  Positive for malaise/fatigue.  HENT: Negative.    Respiratory: Negative.    Cardiovascular: Negative.   Gastrointestinal: Negative.   Musculoskeletal: Negative.   Neurological: Negative.   Psychiatric/Behavioral: Negative.    All other systems reviewed and are negative.       Objective:    BP 122/60 (BP Location: Left Arm, Patient Position: Sitting, Cuff Size: Large)   Pulse 60   Temp 98.3 F (36.8 C) (Oral)   Ht 5' 7.5" (1.715 m)   Wt 259 lb 11.2 oz (117.8 kg)   SpO2 98%   BMI 40.07 kg/m    Physical Exam Vitals and nursing note reviewed.  Constitutional:      Appearance: Normal appearance.  HENT:     Right Ear: Tympanic membrane and ear canal normal.     Left Ear: Tympanic membrane and ear canal normal.  Cardiovascular:     Rate and Rhythm: Normal rate and regular rhythm.     Pulses: Normal pulses.     Heart sounds: Normal heart sounds.  Pulmonary:     Effort: Pulmonary effort is normal.     Breath sounds: Normal breath sounds.  Musculoskeletal:        General: Normal range of motion.     Cervical back: Normal range of motion and neck supple.  Skin:    General: Skin is warm and dry.  Neurological:      Mental Status: He is alert.  Psychiatric:        Mood and Affect: Mood normal.        Behavior: Behavior normal.     Results for orders placed or performed in visit on 03/03/22  Testosterone  Result Value Ref Range   Testosterone 147.93 (L) 300.00 - 890.00 ng/dL        Assessment & Plan:   Problem List Items Addressed This Visit     Type II diabetes mellitus with complication (HCC) - Primary   Relevant Orders   Testosterone (Completed)   Essential hypertension   Relevant Medications   sildenafil (VIAGRA) 100 MG tablet   ERECTILE DYSFUNCTION, ORGANIC   Relevant Orders   Testosterone (Completed)   Other Visit Diagnoses     Morbid obesity (HCC)       Relevant Orders   Nocturnal polysomnography (NPSG)   Fatigue, unspecified type       Relevant Orders   Testosterone (Completed)   Nocturnal polysomnography (NPSG)       Meds ordered this encounter  Medications   sildenafil (VIAGRA) 100 MG tablet    Sig: Take 0.5-1 tablets (50-100 mg total) by mouth daily as needed for erectile dysfunction.  Dispense:  90 tablet    Refill:  1    Referrals placed. Testosterone obtained. Will notify patient pending results.   Eulis Foster, FNP

## 2022-03-08 ENCOUNTER — Other Ambulatory Visit: Payer: Self-pay | Admitting: Family

## 2022-03-08 MED ORDER — TESTOSTERONE 20.25 MG/1.25GM (1.62%) TD GEL: 1.0000 | Freq: Every day | TRANSDERMAL | 5 refills | Status: DC

## 2022-03-08 NOTE — Progress Notes (Signed)
Rx sent 

## 2022-03-09 ENCOUNTER — Telehealth: Payer: Self-pay | Admitting: *Deleted

## 2022-03-10 ENCOUNTER — Encounter: Payer: Self-pay | Admitting: Podiatry

## 2022-03-10 ENCOUNTER — Ambulatory Visit (INDEPENDENT_AMBULATORY_CARE_PROVIDER_SITE_OTHER): Payer: Commercial Managed Care - PPO | Admitting: Podiatry

## 2022-03-10 DIAGNOSIS — M79674 Pain in right toe(s): Secondary | ICD-10-CM | POA: Diagnosis not present

## 2022-03-10 DIAGNOSIS — B351 Tinea unguium: Secondary | ICD-10-CM

## 2022-03-10 DIAGNOSIS — M79675 Pain in left toe(s): Secondary | ICD-10-CM

## 2022-03-10 DIAGNOSIS — E118 Type 2 diabetes mellitus with unspecified complications: Secondary | ICD-10-CM

## 2022-04-24 ENCOUNTER — Other Ambulatory Visit: Payer: Self-pay | Admitting: *Deleted

## 2022-04-25 ENCOUNTER — Other Ambulatory Visit: Payer: Self-pay | Admitting: *Deleted

## 2022-04-25 DIAGNOSIS — I10 Essential (primary) hypertension: Secondary | ICD-10-CM

## 2022-04-25 MED ORDER — SPIRONOLACTONE 25 MG PO TABS: 25.0000 mg | ORAL_TABLET | Freq: Every day | ORAL | 0 refills | Status: DC

## 2022-04-25 MED ORDER — OLMESARTAN-AMLODIPINE-HCTZ 40-10-25 MG PO TABS: 1.0000 | ORAL_TABLET | Freq: Every day | ORAL | 0 refills | Status: DC

## 2022-04-25 MED ORDER — POTASSIUM CHLORIDE CRYS ER 20 MEQ PO TBCR: EXTENDED_RELEASE_TABLET | ORAL | 0 refills | Status: DC

## 2022-04-28 LAB — HM DIABETES EYE EXAM

## 2022-05-04 ENCOUNTER — Encounter: Payer: Self-pay | Admitting: Family Medicine

## 2022-05-09 ENCOUNTER — Other Ambulatory Visit: Payer: Self-pay | Admitting: *Deleted

## 2022-05-09 DIAGNOSIS — I10 Essential (primary) hypertension: Secondary | ICD-10-CM

## 2022-05-10 MED ORDER — METOPROLOL SUCCINATE ER 100 MG PO TB24: 100.0000 mg | ORAL_TABLET | Freq: Every day | ORAL | 0 refills | Status: DC

## 2022-05-16 ENCOUNTER — Ambulatory Visit (HOSPITAL_BASED_OUTPATIENT_CLINIC_OR_DEPARTMENT_OTHER): Payer: Commercial Managed Care - PPO | Attending: Family | Admitting: Internal Medicine

## 2022-05-16 VITALS — Ht 68.0 in | Wt 245.0 lb

## 2022-05-16 DIAGNOSIS — R5383 Other fatigue: Secondary | ICD-10-CM | POA: Diagnosis not present

## 2022-05-16 DIAGNOSIS — G4733 Obstructive sleep apnea (adult) (pediatric): Secondary | ICD-10-CM | POA: Diagnosis not present

## 2022-05-18 ENCOUNTER — Other Ambulatory Visit: Payer: Self-pay | Admitting: Family Medicine

## 2022-05-18 DIAGNOSIS — I10 Essential (primary) hypertension: Secondary | ICD-10-CM

## 2022-05-22 DIAGNOSIS — R5383 Other fatigue: Secondary | ICD-10-CM

## 2022-05-22 NOTE — Procedures (Signed)
    Patient Name: Javier [name], Javier [name] Date: 05/16/2022 Gender: Male D.O.B: 1951/01/04 Age (years): 29 Referring Provider: Worthy Rancher FNP Height (inches): 68 Interpreting Physician: Jetty Duhamel MD, ABSM Weight (lbs): 245 RPSGT: Shelah Lewandowsky BMI: 37 MRN: [id number] Neck Size: 17.00  CLINICAL INFORMATION Sleep Study Type: NPSG Indication for sleep study: Diabetes, Fatigue, Hypertension, Morbid Obesity Epworth Sleepiness Score: 1  SLEEP STUDY TECHNIQUE As per the AASM Manual for the Scoring of Sleep and Associated Events v2.3 (April 2016) with a hypopnea requiring 4% desaturations.  The channels recorded and monitored were frontal, central and occipital EEG, electrooculogram (EOG), submentalis EMG (chin), nasal and oral airflow, thoracic and abdominal wall motion, anterior tibialis EMG, snore microphone, electrocardiogram, and pulse oximetry.  MEDICATIONS Medications self-administered by patient taken the night of the study : none reported  SLEEP ARCHITECTURE The study was initiated at 10:05:34 PM and ended at 5:03:42 AM.  Sleep onset time was 23.8 minutes and the sleep efficiency was 68.2%%. The total sleep time was 285 minutes.  Stage REM latency was 73.5 minutes.  The patient spent 11.4%% of the night in stage N1 sleep, 73.3%% in stage N2 sleep, 0.0%% in stage N3 and 15.3% in REM.  Alpha intrusion was absent.  Supine sleep was 0.35%.  RESPIRATORY PARAMETERS The overall apnea/hypopnea index (AHI) was 37.5 per hour. There were 66 total apneas, including 46 obstructive, 2 central and 18 mixed apneas. There were 112 hypopneas and 15 RERAs.  The AHI during Stage REM sleep was 82.8 per hour.  AHI while supine was 120.0 per hour.  The mean oxygen saturation was 89.9%. The minimum SpO2 during sleep was 80.0%.  moderate snoring was noted during this study.  CARDIAC DATA The 2 lead EKG demonstrated sinus rhythm. The mean heart rate was 61.3 beats per minute. Other EKG  findings include: None.  LEG MOVEMENT DATA The total PLMS were 0 with a resulting PLMS index of 0.0. Associated arousal with leg movement index was 0.0 .  IMPRESSIONS - Severe obstructive sleep apnea occurred during this study (AHI = 37.5/h). - No significant central sleep apnea occurred during this study (CAI = 0.4/h). - Moderate oxygen desaturation was noted during this study (Min O2 = 80.0%). Mean 89.9%. - The patient snored with moderate snoring volume. - No cardiac abnormalities were noted during this study. - Clinically significant periodic limb movements did not occur during sleep. No significant associated arousals.  DIAGNOSIS - Obstructive Sleep Apnea (G47.33) - Nocturnal Hypoxemia (G47.36)  RECOMMENDATIONS - Suggest CPAP titration sleep study or autopap. Other options would be based on clinical judgment. - Be careful with alcohol, sedatives and other CNS depressants that may worsen sleep apnea and disrupt normal sleep architecture. - Sleep hygiene should be reviewed to assess factors that may improve sleep quality. - Weight management and regular exercise should be initiated or continued if appropriate.  [Electronically signed] 05/22/2022 12:34 PM  Jetty Duhamel MD, ABSM Diplomate, American Board of Sleep Medicine NPI: [id number]                         Jetty Duhamel Diplomate, American Board of Sleep Medicine  ELECTRONICALLY SIGNED ON:  05/22/2022, 12:31 PM [facility] PH: [phone]   FX: [phone] ACCREDITED BY THE AMERICAN ACADEMY OF SLEEP MEDICINE

## 2022-05-25 ENCOUNTER — Other Ambulatory Visit: Payer: Self-pay | Admitting: *Deleted

## 2022-05-25 MED ORDER — ROSUVASTATIN CALCIUM 5 MG PO TABS: 5.0000 mg | ORAL_TABLET | Freq: Every day | ORAL | 0 refills | Status: DC

## 2022-05-30 ENCOUNTER — Emergency Department (HOSPITAL_COMMUNITY)
Admission: EM | Admit: 2022-05-30 | Discharge: 2022-05-30 | Disposition: A | Discharge status: Home / Self Care | Payer: Commercial Managed Care - PPO | Attending: Emergency Medicine | Admitting: Emergency Medicine

## 2022-05-30 ENCOUNTER — Emergency Department (HOSPITAL_COMMUNITY): Payer: Commercial Managed Care - PPO

## 2022-05-30 ENCOUNTER — Other Ambulatory Visit: Payer: Self-pay

## 2022-05-30 DIAGNOSIS — R5381 Other malaise: Secondary | ICD-10-CM | POA: Diagnosis not present

## 2022-05-30 DIAGNOSIS — R42 Dizziness and giddiness: Secondary | ICD-10-CM | POA: Diagnosis present

## 2022-05-30 DIAGNOSIS — R5383 Other fatigue: Secondary | ICD-10-CM | POA: Insufficient documentation

## 2022-05-30 DIAGNOSIS — R197 Diarrhea, unspecified: Secondary | ICD-10-CM | POA: Diagnosis not present

## 2022-05-30 LAB — COMPREHENSIVE METABOLIC PANEL
ALT: 25 U/L (ref 0–44)
AST: 22 U/L (ref 15–41)
Albumin: 3.1 g/dL — ABNORMAL LOW (ref 3.5–5.0)
Alkaline Phosphatase: 58 U/L (ref 38–126)
Anion gap: 7 (ref 5–15)
BUN: 21 mg/dL (ref 8–23)
CO2: 24 mmol/L (ref 22–32)
Calcium: 9.1 mg/dL (ref 8.9–10.3)
Chloride: 105 mmol/L (ref 98–111)
Creatinine, Ser: 1.03 mg/dL (ref 0.61–1.24)
GFR, Estimated: >60 mL/min (ref >60)
Glucose, Bld: 133 mg/dL — ABNORMAL HIGH (ref 70–99)
Potassium: 4.1 mmol/L (ref 3.5–5.1)
Sodium: 136 mmol/L (ref 135–145)
Total Bilirubin: 0.7 mg/dL (ref 0.3–1.2)
Total Protein: 7.1 g/dL (ref 6.5–8.1)

## 2022-05-30 LAB — CBC WITH DIFFERENTIAL/PLATELET
Abs Immature Granulocytes: 0.01 10*3/uL (ref 0.00–0.07)
Basophils Absolute: 0 10*3/uL (ref 0.0–0.1)
Basophils Relative: 0 %
Eosinophils Absolute: 0.1 10*3/uL (ref 0.0–0.5)
Eosinophils Relative: 1 %
HCT: 40.7 % (ref 39.0–52.0)
Hemoglobin: 13.3 g/dL (ref 13.0–17.0)
Immature Granulocytes: 0 %
Lymphocytes Relative: 19 %
Lymphs Abs: 1.1 10*3/uL (ref 0.7–4.0)
MCH: 29 pg (ref 26.0–34.0)
MCHC: 32.7 g/dL (ref 30.0–36.0)
MCV: 88.7 fL (ref 80.0–100.0)
Monocytes Absolute: 0.6 10*3/uL (ref 0.1–1.0)
Monocytes Relative: 12 %
Neutro Abs: 3.8 10*3/uL (ref 1.7–7.7)
Neutrophils Relative %: 68 %
Platelets: 252 10*3/uL (ref 150–400)
RBC: 4.59 MIL/uL (ref 4.22–5.81)
RDW: 15 % (ref 11.5–15.5)
WBC: 5.6 10*3/uL (ref 4.0–10.5)
nRBC: 0 % (ref 0.0–0.2)

## 2022-05-30 LAB — URINALYSIS, ROUTINE W REFLEX MICROSCOPIC
Bilirubin Urine: NEGATIVE
Glucose, UA: NEGATIVE mg/dL
Hgb urine dipstick: NEGATIVE
Ketones, ur: NEGATIVE mg/dL
Leukocytes,Ua: NEGATIVE
Nitrite: NEGATIVE
Protein, ur: NEGATIVE mg/dL
Specific Gravity, Urine: 1.014 (ref 1.005–1.030)
pH: 5 (ref 5.0–8.0)

## 2022-05-30 LAB — TROPONIN I (HIGH SENSITIVITY)
Troponin I (High Sensitivity): 6 ng/L (ref <18)
Troponin I (High Sensitivity): 6 ng/L (ref <18)

## 2022-05-30 LAB — CBG MONITORING, ED: Glucose-Capillary: 120 mg/dL — ABNORMAL HIGH (ref 70–99)

## 2022-05-30 LAB — LIPASE, BLOOD: Lipase: 39 U/L (ref 11–51)

## 2022-05-30 MED ORDER — ACETAMINOPHEN 500 MG PO TABS
1000.0000 mg | ORAL_TABLET | Freq: Once | ORAL | Status: AC
  Administered 2022-05-30: 1000 mg via ORAL
  Filled 2022-05-30: qty 2

## 2022-05-30 MED ORDER — SODIUM CHLORIDE 0.9 % IV BOLUS
500.0000 mL | Freq: Once | INTRAVENOUS | Status: AC
  Administered 2022-05-30: 500 mL via INTRAVENOUS

## 2022-05-30 NOTE — ED Provider Notes (Signed)
[facility] EMERGENCY DEPARTMENT Provider Note   CSN: [id number] Arrival date & time: 05/30/22  8657     History  Chief Complaint  Patient presents with   Dizziness   light headedness   Headache    Javier [name] is a 71 y.o. male.  71 year old male with prior medical history as detailed below presents for evaluation.  Patient reports 48 hours of malaise and fatigue.  Patient reports some intermittent loose stool for the last 2 days.  Patient denies fever or vomiting.  He denies any chest pain.  Patient reports that he worked through his symptoms yesterday.  Today he went to work and felt poorly enough to come to the ED for evaluation.  The history is provided by the patient and medical records.       Home Medications Prior to Admission medications   Medication Sig Start Date End Date Taking? Authorizing Provider  hydrALAZINE (APRESOLINE) 50 MG tablet TAKE 1 TABLET BY MOUTH THREE TIMES A DAY 01/27/22   Koberlein, Jannette Spanner C, MD  metoprolol succinate (TOPROL-XL) 100 MG 24 hr tablet TAKE 1 TABLET BY MOUTH DAILY. TAKE WITH OR IMMEDIATELY FOLLOWING A MEAL. 05/18/22   Karie Georges, MD  mirabegron ER (MYRBETRIQ) 25 MG TB24 tablet Take 1 tablet (25 mg total) by mouth daily. 01/27/22   Koberlein, Paris Lore, MD  Olmesartan-amLODIPine-HCTZ 40-10-25 MG TABS Take 1 tablet by mouth daily. 04/25/22   Karie Georges, MD  potassium chloride SA (KLOR-CON M20) 20 MEQ tablet TAKE 1 TABLET BY MOUTH EVERY DAY 04/25/22   Karie Georges, MD  rosuvastatin (CRESTOR) 5 MG tablet Take 1 tablet (5 mg total) by mouth daily. 05/25/22   Karie Georges, MD  sildenafil (VIAGRA) 100 MG tablet Take 0.5-1 tablets (50-100 mg total) by mouth daily as needed for erectile dysfunction. 03/03/22   Eulis Foster, FNP  spironolactone (ALDACTONE) 25 MG tablet Take 1 tablet (25 mg total) by mouth daily. 04/25/22   Karie Georges, MD  tamsulosin (FLOMAX) 0.4 MG CAPS capsule TAKE 1 CAPSULE BY MOUTH  EVERY DAY 01/27/22   Wynn Banker, MD  Testosterone (ANDROGEL) 20.25 MG/1.25GM (1.62%) GEL Place 1 packet onto the skin daily. 03/08/22   Worthy Rancher B, FNP      Allergies    Atorvastatin, Catapres [clonidine hcl], and Pravastatin sodium    Review of Systems   Review of Systems  All other systems reviewed and are negative.   Physical Exam Updated Vital Signs BP 135/62   Pulse 61   Temp 97.8 F (36.6 C) (Oral)   Resp 16   Ht 5\' 8"  (1.727 m)   Wt 120.2 kg   SpO2 97%   BMI 40.29 kg/m  Physical Exam Vitals and nursing note reviewed.  Constitutional:      General: He is not in acute distress.    Appearance: Normal appearance. He is well-developed.  HENT:     Head: Normocephalic and atraumatic.  Eyes:     Conjunctiva/sclera: Conjunctivae normal.     Pupils: Pupils are equal, round, and reactive to light.  Cardiovascular:     Rate and Rhythm: Normal rate and regular rhythm.     Heart sounds: Normal heart sounds.  Pulmonary:     Effort: Pulmonary effort is normal. No respiratory distress.     Breath sounds: Normal breath sounds.  Abdominal:     General: There is no distension.     Palpations: Abdomen is soft.  Tenderness: There is no abdominal tenderness.  Musculoskeletal:        General: No deformity. Normal range of motion.     Cervical back: Normal range of motion and neck supple.  Skin:    General: Skin is warm and dry.  Neurological:     General: No focal deficit present.     Mental Status: He is alert and oriented to person, place, and time.     ED Results / Procedures / Treatments   Labs (all labs ordered are listed, but only abnormal results are displayed) Labs Reviewed  COMPREHENSIVE METABOLIC PANEL - Abnormal; Notable for the following components:      Result Value   Glucose, Bld 133 (*)    Albumin 3.1 (*)    All other components within normal limits  CBG MONITORING, ED - Abnormal; Notable for the following components:   Glucose-Capillary  120 (*)    All other components within normal limits  CBC WITH DIFFERENTIAL/PLATELET  LIPASE, BLOOD  URINALYSIS, ROUTINE W REFLEX MICROSCOPIC  TROPONIN I (HIGH SENSITIVITY)  TROPONIN I (HIGH SENSITIVITY)    EKG EKG Interpretation  Date/Time:  Tuesday May 30 2022 10:07:45 EDT Ventricular Rate:  65 PR Interval:  216 QRS Duration: 84 QT Interval:  406 QTC Calculation: 422 R Axis:   47 Text Interpretation: Sinus rhythm with 1st degree A-V block Otherwise normal ECG When compared with ECG of 04-Dec-2020 11:37, PREVIOUS ECG IS PRESENT Confirmed by Dene Gentry [phone]) on 05/30/2022 11:00:03 AM  Radiology DG Chest 2 View  Result Date: 05/30/2022 CLINICAL DATA:  Shortness of breath, dizziness, lightheaded EXAM: CHEST - 2 VIEW COMPARISON:  Radiograph 10/13/2011 FINDINGS: Cardiomediastinal silhouette is within normal limits. There is no focal airspace disease. There is no pleural effusion. No pneumothorax. There is no acute osseous abnormality. Thoracic spondylosis. IMPRESSION: No evidence of acute cardiopulmonary disease. Electronically Signed   By: Maurine Simmering M.D.   On: 05/30/2022 10:35    Procedures Procedures    Medications Ordered in ED Medications  sodium chloride 0.9 % bolus 500 mL (0 mLs Intravenous Stopped 05/30/22 1218)  acetaminophen (TYLENOL) tablet 1,000 mg (1,000 mg Oral Given 05/30/22 1128)    ED Course/ Medical Decision Making/ A&P                           Medical Decision Making Risk OTC drugs.    Medical Screen Complete  This patient presented to the ED with complaint of malaise, fatigue, loose stools.  This complaint involves an extensive number of treatment options. The initial differential diagnosis includes, but is not limited to, likely viral syndrome, metabolic abnormality, etc.  This presentation is: Acute, Self-Limited, Previously Undiagnosed, Uncertain Prognosis, Complicated, Systemic Symptoms, and Threat to Life/Bodily Function  Patient is  presenting with a host of symptoms most likely consistent with likely viral syndrome.  Screening labs obtained are without significant abnormality.  After ED evaluation patient's symptoms are improved.  He desires discharge home.  He is taking good p.o. at time of discharge.  Patient does understand need for close outpatient follow-up.  Strict return precautions given and understood.    Additional history obtained:  External records from outside sources obtained and reviewed including prior ED visits and prior Inpatient records.    Lab Tests:  I ordered and personally interpreted labs.  The pertinent results include: CBC, CMP, UA, troponin, CBG, lipase   Imaging Studies ordered:  I ordered imaging studies including chest x-ray  I independently visualized and interpreted obtained imaging which showed NAD I agree with the radiologist interpretation.   Cardiac Monitoring:  The patient was maintained on a cardiac monitor.  I personally viewed and interpreted the cardiac monitor which showed an underlying rhythm of: NSR   Medicines ordered:  I ordered medication including Tylenol, IV fluids for viral syndrome  Reevaluation of the patient after these medicines showed that the patient: improved   Problem List / ED Course:  Dizziness   Reevaluation:  After the interventions noted above, I reevaluated the patient and found that they have: improved   Disposition:  After consideration of the diagnostic results and the patients response to treatment, I feel that the patent would benefit from close outpatient followup.          Final Clinical Impression(s) / ED Diagnoses Final diagnoses:  Lightheadedness    Rx / DC Orders ED Discharge Orders     None         Wynetta Fines, MD 05/30/22 1501

## 2022-05-30 NOTE — ED Provider Triage Note (Signed)
Emergency Medicine Provider Triage Evaluation Note  Javier [name] , a 71 y.o. male  was evaluated in triage.  Pt complains of lightheadedness, nausea, loose stools since yesterday. Reports that he ate at [employer] yesterday and had loose stools afterwords. Reports he feels some nausea and some SOB, but denies any chestpain. Reports he has a queasy feeling in his stomach, but no pain.   Review of Systems  Positive:  Negative:   Physical Exam  There were no vitals taken for this visit. Gen:   Awake, no distress   Resp:  Normal effort  MSK:   Moves extremities without difficulty  Other:  Abdomen soft, non-tender  Medical Decision Making  Medically screening exam initiated at 9:58 AM.  Appropriate orders placed.  Oseias A Buttery was informed that the remainder of the evaluation will be completed by another provider, this initial triage assessment does not replace that evaluation, and the importance of remaining in the ED until their evaluation is complete.  Labs ordered   Achille Rich, PA-C 05/30/22 1000

## 2022-05-30 NOTE — Discharge Instructions (Signed)
Return for any problem.  ? ?Drink plenty of fluids. ?

## 2022-05-30 NOTE — ED Triage Notes (Signed)
Pt. Stated, Ive had dizziness with lightlessness that started . I am a diabetic Type II but Ive not taken my medication in 3 years , she took me off and my numbers might be going up.

## 2022-06-01 ENCOUNTER — Telehealth: Payer: Self-pay | Admitting: Family Medicine

## 2022-06-01 DIAGNOSIS — G4733 Obstructive sleep apnea (adult) (pediatric): Secondary | ICD-10-CM

## 2022-06-01 DIAGNOSIS — G4730 Sleep apnea, unspecified: Secondary | ICD-10-CM

## 2022-06-01 NOTE — Telephone Encounter (Signed)
Pt is here to pick up a form that was to be fill out after his sleep study ,

## 2022-06-01 NOTE — Telephone Encounter (Signed)
I filled out the paperwork, however his sleep study shows severe OSA-- has he seen the lung doctor or received his CPAP machine?? I did make a note of it on the paperwork

## 2022-06-01 NOTE — Telephone Encounter (Signed)
I spoke with the patient and he stated he does not have a CPAP and was not aware this was needed.  Per Dr Casimiro Needle, I informed the patient he does have sleep apnea, paperwork was completed  and given to the patient which states this and advised he contact [facility] at [phone] as soon as possible for an appointment to discuss this and to receive the CPAP.  Patient asked if this could interfere with receiving a license to drive and I advised he discuss this with the specialist.  A copy of the forms were sent to be scanned.

## 2022-06-02 NOTE — Telephone Encounter (Signed)
Referral entered as below.  I called the patient and informed him the referral was entered as below.  I apologized to the patient as we were not aware a new referral was needed.  Patient stated he just left the pulmonary office and was told the study will be reviewed by the provider to determine if a CPAP is needed.

## 2022-06-02 NOTE — Telephone Encounter (Signed)
Pt showed up at our office today requesting his C- pap machine. Pt states he was sent to our office by PCP for a C-pap. Pt has never established with [place].   Routing PCP as Lorain Childes

## 2022-06-02 NOTE — Addendum Note (Signed)
Addended by: Johnella Moloney on: 06/02/2022 04:35 PM   Modules accepted: Orders

## 2022-06-02 NOTE — Telephone Encounter (Signed)
Please send a referral to pulmonology for OSA to Dr. Maple Hudson so that the patient can establish.

## 2022-06-02 NOTE — Addendum Note (Signed)
Addended by: Johnella Moloney on: 06/02/2022 04:37 PM   Modules accepted: Orders

## 2022-06-21 ENCOUNTER — Institutional Professional Consult (permissible substitution): Payer: Commercial Managed Care - PPO | Admitting: Pulmonary Disease

## 2022-06-23 ENCOUNTER — Ambulatory Visit: Payer: Commercial Managed Care - PPO | Admitting: Family Medicine

## 2022-06-23 ENCOUNTER — Encounter: Payer: Self-pay | Admitting: Family Medicine

## 2022-06-23 VITALS — BP 100/52 | HR 62 | Temp 98.8°F | Ht 68.0 in | Wt 257.6 lb

## 2022-06-23 DIAGNOSIS — E118 Type 2 diabetes mellitus with unspecified complications: Secondary | ICD-10-CM

## 2022-06-23 DIAGNOSIS — B351 Tinea unguium: Secondary | ICD-10-CM | POA: Diagnosis not present

## 2022-06-23 DIAGNOSIS — Z23 Encounter for immunization: Secondary | ICD-10-CM

## 2022-06-23 DIAGNOSIS — I10 Essential (primary) hypertension: Secondary | ICD-10-CM

## 2022-06-23 DIAGNOSIS — Z1211 Encounter for screening for malignant neoplasm of colon: Secondary | ICD-10-CM

## 2022-06-23 LAB — POCT GLYCOSYLATED HEMOGLOBIN (HGB A1C): Hemoglobin A1C: 5.7 % — AB (ref 4.0–5.6)

## 2022-06-23 MED ORDER — TERBINAFINE HCL 250 MG PO TABS: 250.0000 mg | ORAL_TABLET | Freq: Every day | ORAL | 0 refills | Status: DC

## 2022-06-23 NOTE — Progress Notes (Signed)
Established Patient Office Visit  Subjective   Patient ID: Javier [name], male    DOB: 1951/06/24  Age: 71 y.o. MRN: [id number]  Chief Complaint  Patient presents with   Establish Care    Pt is here for transition of care visit.  HTN -- BP in office performed and is well controlled. He  reports no side effects to the medications, no chest pain, SOB, dizziness or headaches. He has a BP cuff at home and is checking BP regularly, reports they are in the normal range.   Diabetes-- Not currently on medication for this, A1C was performed in office today was 5.7 which is only prediabetes. Pt states that he used to take medication for this but was taken off the medication since his A1C came down. States that he is just controlling it with diet at this time. He reports he went to the podiatrist for his foot care, states that he was told he had a fungal infection in his left great toe and also some in the right foot but was not given medication for this.   Low testosterone -- patient has not started the testosterone yet, he reports it was not covered by his insurance and cost him 360 dollars. He reports his main symptom is erectile dysfunction. We discussed that the low T is most likely secondary to his other medical problems, and that testosterone naturally decreases in males with age.    Current Outpatient Medications  Medication Instructions   hydrALAZINE (APRESOLINE) 50 MG tablet TAKE 1 TABLET BY MOUTH THREE TIMES A DAY   metoprolol succinate (TOPROL-XL) 100 mg, Oral, Daily, TAKE WITH OR IMMEDIATELY FOLLOWING A MEAL.   mirabegron ER (MYRBETRIQ) 25 mg, Oral, Daily   Olmesartan-amLODIPine-HCTZ 40-10-25 MG TABS 1 tablet, Oral, Daily   potassium chloride SA (KLOR-CON M20) 20 MEQ tablet TAKE 1 TABLET BY MOUTH EVERY DAY   rosuvastatin (CRESTOR) 5 mg, Oral, Daily   sildenafil (VIAGRA) 50-100 mg, Oral, Daily PRN   spironolactone (ALDACTONE) 25 mg, Oral, Daily   tamsulosin (FLOMAX) 0.4 MG CAPS  capsule TAKE 1 CAPSULE BY MOUTH EVERY DAY   terbinafine (LAMISIL) 250 mg, Oral, Daily   Testosterone (ANDROGEL) 20.25 MG/1.25GM (1.62%) GEL 1 packet, Transdermal, Daily     Patient Active Problem List   Diagnosis Date Noted   Fatigue 05/16/2022   Morbid obesity ([place]) 05/16/2022   Pain due to onychomycosis of toenails of both feet 03/10/2022   Type II diabetes mellitus with complication ([place]) 29/93/7169   Anxiety disorder 06/02/2011   ERECTILE DYSFUNCTION, 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     Objective:     BP (!) 100/52 (BP Location: Right Arm, Patient Position: Sitting, Cuff Size: Large)   Pulse 62   Temp 98.8 F (37.1 C) (Oral)   Ht 5\' 8"  (1.727 m)   Wt 257 lb 9.6 oz (116.8 kg)   SpO2 100%   BMI 39.17 kg/m  BP Readings from Last 3 Encounters:  06/23/22 (!) 100/52  05/30/22 135/62  03/03/22 122/60   Wt Readings from Last 3 Encounters:  06/23/22 257 lb 9.6 oz (116.8 kg)  05/30/22 265 lb (120.2 kg)  05/16/22 245 lb (111.1 kg)      Physical Exam Vitals  reviewed.  Constitutional:      Appearance: Normal appearance. He is well-groomed. He is obese.  HENT:     Head: Normocephalic and atraumatic.  Eyes:     Conjunctiva/sclera: Conjunctivae normal.  Cardiovascular:     Rate and Rhythm: Normal rate and regular rhythm.     Pulses: Normal pulses.     Heart sounds: S1 normal and S2 normal. No murmur heard. Pulmonary:     Effort: Pulmonary effort is normal.     Breath sounds: Normal breath sounds and air entry. No rales.  Abdominal:     General: Bowel sounds are normal.     Tenderness: There is no abdominal tenderness.  Musculoskeletal:     Right lower leg: No edema.     Left lower leg: No edema.   Feet:     Right foot:     Toenail Condition: Fungal disease present.    Left foot:     Toenail Condition: Fungal disease present. Neurological:     General: No focal deficit present.     Mental Status: He is alert and oriented to person, place, and time.     Gait: Gait is intact.  Psychiatric:        Mood and Affect: Mood and affect normal.        Behavior: Behavior normal.      Results for orders placed or performed in visit on 06/23/22  POC HgB A1c  Result Value Ref Range   Hemoglobin A1C 5.7 (A) 4.0 - 5.6 %   HbA1c POC (<> result, manual entry)     HbA1c, POC (prediabetic range)     HbA1c, POC (controlled diabetic range)      Last lipids Lab Results  Component Value Date   CHOL 161 01/27/2022   HDL 45.00 01/27/2022   LDLCALC 87 01/27/2022   TRIG 145.0 01/27/2022   CHOLHDL 4 01/27/2022      The 00-[id number] ASCVD risk score (Arnett DK, et al., 2019) is: 22.2%    Assessment & Plan:   Problem List Items Addressed This Visit       Cardiovascular and Mediastinum   Essential hypertension    Current hypertension medications:       Sig   hydrALAZINE (APRESOLINE) 50 MG tablet (Taking) TAKE 1 TABLET BY MOUTH THREE TIMES A DAY   metoprolol succinate (TOPROL-XL) 100 MG 24 hr tablet (Taking) TAKE 1 TABLET BY MOUTH DAILY. TAKE WITH OR IMMEDIATELY FOLLOWING A MEAL.   Olmesartan-amLODIPine-HCTZ 40-10-25 MG TABS (Taking) Take 1 tablet by mouth daily.   sildenafil (VIAGRA) 100 MG tablet (Taking) Take 0.5-1 tablets (50-100 mg total) by mouth daily as needed for erectile dysfunction.   spironolactone (ALDACTONE) 25 MG tablet (Taking) Take 1 tablet (25 mg total) by mouth daily.  BP is well controlled on the above medications, will continue these as prescribed.         Endocrine   Type II diabetes mellitus with complication (HCC) - Primary    A1C is in the prediabetic range today, he is not currently on any medications for this, it is likely that he is only a prediabetic. Will  continue to monitor this every 6 months.      Relevant Orders   POC HgB A1c (Completed)   Other Visit Diagnoses     Onychomycosis of left great toe       Relevant Medications   terbinafine (LAMISIL) 250 MG tablet-- Will treat with oral terbinafine, he will retun to the podiatrist for  regular foot care.   Immunization due       Relevant Orders   Flu Vaccine QUAD High Dose(Fluad) (Completed)   Colon cancer screening       Relevant Orders   Cologuard       Return in about 6 months (around 12/23/2022).    Karie Georges, MD

## 2022-06-26 NOTE — Assessment & Plan Note (Signed)
A1C is in the prediabetic range today, he is not currently on any medications for this, it is likely that he is only a prediabetic. Will continue to monitor this every 6 months.

## 2022-06-26 NOTE — Assessment & Plan Note (Signed)
Current hypertension medications:      Sig   hydrALAZINE (APRESOLINE) 50 MG tablet (Taking) TAKE 1 TABLET BY MOUTH THREE TIMES A DAY   metoprolol succinate (TOPROL-XL) 100 MG 24 hr tablet (Taking) TAKE 1 TABLET BY MOUTH DAILY. TAKE WITH OR IMMEDIATELY FOLLOWING A MEAL.   Olmesartan-amLODIPine-HCTZ 40-10-25 MG TABS (Taking) Take 1 tablet by mouth daily.   sildenafil (VIAGRA) 100 MG tablet (Taking) Take 0.5-1 tablets (50-100 mg total) by mouth daily as needed for erectile dysfunction.   spironolactone (ALDACTONE) 25 MG tablet (Taking) Take 1 tablet (25 mg total) by mouth daily.     BP is well controlled on the above medications, will continue these as prescribed.

## 2022-06-29 NOTE — Progress Notes (Unsigned)
06/30/22- 71 yoM never smoker for sleep evaluation courtesy of Dr  Nira Conn with concern of OSA Medical problem list includes HTN, GERD, DM2, BPH, Morbid Obesity,  NPSG 05/16/22- AHI 37.5/ hr, desaturation to 80%, body weight 245 lbs Epworth score-0 Body weight today-261 lbs Covid vax-3 Phizer Flu vax-had He describes being tired often but blames frequent nocturia due to BPH.  Wife complains to him of his snoring and since him to sleep on the sofa. He denies history of ENT surgery, heart or lung disease.  Denies family history of OSA.  Treated for hypertension.  Does ground work/maintenance.  No routine sleep med.  Prior to Admission medications   Medication Sig Start Date End Date Taking? Authorizing Provider  hydrALAZINE (APRESOLINE) 50 MG tablet TAKE 1 TABLET BY MOUTH THREE TIMES A DAY 01/27/22  Yes Koberlein, Junell C, MD  metoprolol succinate (TOPROL-XL) 100 MG 24 hr tablet TAKE 1 TABLET BY MOUTH DAILY. TAKE WITH OR IMMEDIATELY FOLLOWING A MEAL. 05/18/22  Yes Karie Georges, MD  mirabegron ER (MYRBETRIQ) 25 MG TB24 tablet Take 1 tablet (25 mg total) by mouth daily. 01/27/22  Yes Koberlein, Junell C, MD  Olmesartan-amLODIPine-HCTZ 40-10-25 MG TABS Take 1 tablet by mouth daily. 04/25/22  Yes Karie Georges, MD  potassium chloride SA (KLOR-CON M20) 20 MEQ tablet TAKE 1 TABLET BY MOUTH EVERY DAY 04/25/22  Yes Karie Georges, MD  rosuvastatin (CRESTOR) 5 MG tablet Take 1 tablet (5 mg total) by mouth daily. 05/25/22  Yes Karie Georges, MD  sildenafil (VIAGRA) 100 MG tablet Take 0.5-1 tablets (50-100 mg total) by mouth daily as needed for erectile dysfunction. 03/03/22  Yes Worthy Rancher B, FNP  spironolactone (ALDACTONE) 25 MG tablet Take 1 tablet (25 mg total) by mouth daily. 04/25/22  Yes Karie Georges, MD  tamsulosin (FLOMAX) 0.4 MG CAPS capsule TAKE 1 CAPSULE BY MOUTH EVERY DAY 01/27/22  Yes Koberlein, Junell C, MD  terbinafine (LAMISIL) 250 MG tablet Take 1 tablet (250 mg  total) by mouth daily. 06/23/22  Yes Karie Georges, MD  Testosterone (ANDROGEL) 20.25 MG/1.25GM (1.62%) GEL Place 1 packet onto the skin daily. 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   FASCIITIS, PLANTAR 09/30/2007   GERD 02/20/2007   Heart murmur    HYPERTENSION 02/20/2007   HYPOKALEMIA 03/02/2009   Obesity    Past Surgical History:  Procedure Laterality Date   KNEE SURGERY Bilateral    torn cartiledge   Family History  Problem Relation Age of Onset   Heart failure Mother    Other Father        no known med history   High blood pressure Sister    High blood pressure Sister    Social History   Socioeconomic History   Marital status: Married    Spouse name: Not on file   Number of children: Not on file   Years of education: Not on file   Highest education level: Not on file  Occupational History   Not on file  Tobacco Use   Smoking status: Never   Smokeless tobacco: Never  Substance and Sexual Activity   Alcohol use: No   Drug use: No  Sexual activity: Not on file  Other Topics Concern   Not on file  Social History Narrative   Not on file   Social Determinants of Health   Financial Resource Strain: Not on file  Food Insecurity: Not on file  Transportation Needs: Not on file  Physical Activity: Not on file  Stress: Not on file  Social Connections: Not on file  Intimate Partner Violence: Not on file   ROS-see HPI   + = positive Constitutional:    weight loss, night sweats, fevers, chills, fatigue, lassitude. HEENT:    headaches, difficulty swallowing, tooth/dental problems, sore throat,       sneezing, itching, ear ache, nasal congestion, post nasal drip, snoring CV:    chest pain,  orthopnea, PND, swelling in lower extremities, anasarca,                                  dizziness, palpitations Resp:   shortness of breath with exertion or at rest.                productive cough,   non-productive cough, coughing up of blood.              change in color of mucus.  wheezing.   Skin:    rash or lesions. GI:  No-   heartburn, indigestion, abdominal pain, nausea, vomiting, diarrhea,                 change in bowel habits, loss of appetite GU: dysuria, change in color of urine, no urgency or frequency.   flank pain. MS:   joint pain, stiffness, decreased range of motion, back pain. Neuro-     nothing unusual Psych:  change in mood or affect.  depression or anxiety.   memory loss.  OBJ- Physical Exam General- Alert, Oriented, Affect-appropriate, Distress- none acute, + obese Skin- rash-none, lesions- none, excoriation- none Lymphadenopathy- none Head- atraumatic            Eyes- Gross vision intact, PERRLA, conjunctivae and secretions clear            Ears- Hearing, canals-normal            Nose- Clear, no-Septal dev, mucus, polyps, erosion, perforation             Throat- Mallampati IV , mucosa clear , drainage- none, tonsils- atrophic, + teeth Neck- flexible , trachea midline, no stridor , thyroid nl, carotid no bruit Chest - symmetrical excursion , unlabored           Heart/CV- RRR , no murmur , no gallop  , no rub, nl s1 s2                           - JVD- none , edema- none, stasis changes- none, varices- none           Lung- clear to P&A, wheeze- none, cough- none , dullness-none, rub- none           Chest wall-  Abd-  Br/ Gen/ Rectal- Not done, not indicated Extrem- cyanosis- none, clubbing, none, atrophy- none, strength- nl Neuro- grossly intact to observation

## 2022-06-30 ENCOUNTER — Ambulatory Visit (INDEPENDENT_AMBULATORY_CARE_PROVIDER_SITE_OTHER): Payer: Commercial Managed Care - PPO | Admitting: Internal Medicine

## 2022-06-30 ENCOUNTER — Encounter: Payer: Self-pay | Admitting: Internal Medicine

## 2022-06-30 VITALS — BP 122/74 | HR 60 | Ht 67.5 in | Wt 261.4 lb

## 2022-06-30 DIAGNOSIS — R0683 Snoring: Secondary | ICD-10-CM | POA: Diagnosis not present

## 2022-06-30 DIAGNOSIS — G4733 Obstructive sleep apnea (adult) (pediatric): Secondary | ICD-10-CM | POA: Diagnosis not present

## 2022-06-30 NOTE — Patient Instructions (Signed)
Order- new DME, new CPAP auto 5-20, mask of choice,  humidifier, supplies, AirView/ card  Please call as needed 

## 2022-07-04 DIAGNOSIS — G4733 Obstructive sleep apnea (adult) (pediatric): Secondary | ICD-10-CM | POA: Insufficient documentation

## 2022-07-04 NOTE — Assessment & Plan Note (Signed)
Sleep study indicates severe obstructive sleep apnea.  Appropriate discussion done; emphasized safe driving responsibility.  Plan-new CPAP auto 5-20

## 2022-07-04 NOTE — Assessment & Plan Note (Signed)
Emphasized importance of weight management encouraging diet and exercise.

## 2022-07-14 ENCOUNTER — Ambulatory Visit: Payer: Medicare Other | Admitting: Podiatry

## 2022-07-23 ENCOUNTER — Ambulatory Visit (HOSPITAL_COMMUNITY)
Admission: EM | Admit: 2022-07-23 | Discharge: 2022-07-23 | Disposition: A | Discharge status: Home / Self Care | Payer: Commercial Managed Care - PPO | Attending: Emergency Medicine | Admitting: Emergency Medicine

## 2022-07-23 ENCOUNTER — Encounter (HOSPITAL_COMMUNITY): Payer: Self-pay

## 2022-07-23 ENCOUNTER — Ambulatory Visit (INDEPENDENT_AMBULATORY_CARE_PROVIDER_SITE_OTHER): Payer: Commercial Managed Care - PPO

## 2022-07-23 DIAGNOSIS — R059 Cough, unspecified: Secondary | ICD-10-CM | POA: Diagnosis not present

## 2022-07-23 DIAGNOSIS — Z1152 Encounter for screening for COVID-19: Secondary | ICD-10-CM | POA: Diagnosis not present

## 2022-07-23 DIAGNOSIS — J069 Acute upper respiratory infection, unspecified: Secondary | ICD-10-CM | POA: Diagnosis present

## 2022-07-23 NOTE — ED Triage Notes (Signed)
Pt is here for cough, chest congestion, body aches , chills , low in energy,  x4days

## 2022-07-23 NOTE — Discharge Instructions (Addendum)
I recommend symptomatic care with mucinex twice daily. Drink lots of water with it!  We will call if the covid test is positive, and send anti-virals to your pharmacy.  Please return to the urgent care if symptoms do not improve, or go to the emergency department if symptoms worsen.

## 2022-07-23 NOTE — ED Provider Notes (Signed)
[facility]    CSN: [id number] Arrival date & time: 07/23/22  1034      History   Chief Complaint Chief Complaint  Patient presents with   Cough    HPI FELICIANO WYNTER is a 71 y.o. male.  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   FASCIITIS, PLANTAR 09/30/2007   GERD 02/20/2007   Heart murmur    HYPERTENSION 02/20/2007   HYPOKALEMIA 03/02/2009   Obesity     Patient Active Problem List   Diagnosis Date Noted   OSA (obstructive sleep apnea) 07/04/2022   Fatigue 05/16/2022   Morbid obesity (HCC) 05/16/2022   Pain due to onychomycosis of toenails of both feet 03/10/2022   Type II diabetes mellitus with complication (HCC) 10/04/2015   Anxiety disorder 06/02/2011   ERECTILE DYSFUNCTION, 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 Authorizing Provider  hydrALAZINE (APRESOLINE) 50 MG tablet TAKE 1 TABLET BY MOUTH THREE TIMES A DAY 01/27/22   Koberlein, Jannette Spanner C, MD  metoprolol succinate (TOPROL-XL) 100 MG 24 hr tablet TAKE 1 TABLET BY MOUTH DAILY. TAKE WITH OR IMMEDIATELY FOLLOWING A MEAL. 05/18/22   Karie Georges, MD  mirabegron ER (MYRBETRIQ) 25 MG TB24  tablet Take 1 tablet (25 mg total) by mouth daily. 01/27/22   Koberlein, Paris Lore, MD  Olmesartan-amLODIPine-HCTZ 40-10-25 MG TABS Take 1 tablet by mouth daily. 04/25/22   Karie Georges, MD  potassium chloride SA (KLOR-CON M20) 20 MEQ tablet TAKE 1 TABLET BY MOUTH EVERY DAY 04/25/22   Karie Georges, MD  rosuvastatin (CRESTOR) 5 MG tablet Take 1 tablet (5 mg total) by mouth daily. 05/25/22   Karie Georges, MD  sildenafil (VIAGRA) 100 MG tablet Take 0.5-1 tablets (50-100 mg total) by mouth daily as needed for erectile dysfunction. 03/03/22   Eulis Foster, FNP  spironolactone (ALDACTONE) 25 MG tablet Take 1 tablet (25 mg total) by mouth daily. 04/25/22   Karie Georges, MD  tamsulosin (FLOMAX) 0.4 MG CAPS capsule TAKE 1 CAPSULE BY MOUTH EVERY DAY 01/27/22   Koberlein, Paris Lore, MD  terbinafine (LAMISIL) 250 MG tablet Take 1 tablet (250 mg total) by mouth daily. 06/23/22   Karie Georges, MD  Testosterone (ANDROGEL) 20.25 MG/1.25GM (1.62%) GEL Place 1 packet onto the skin daily. 03/08/22   Eulis Foster, FNP    Family History Family History  Problem Relation Age of Onset   Heart failure Mother    Other Father        no known med history   High blood pressure Sister    High blood pressure Sister     Social History Social  History   Tobacco Use   Smoking status: Never   Smokeless tobacco: Never  Substance Use Topics   Alcohol use: No   Drug use: No     Allergies   Atorvastatin, Catapres [clonidine hcl], and Pravastatin sodium   Review of Systems Review of Systems  Respiratory:  Positive for cough.    Per HPI  Physical Exam Triage Vital Signs ED Triage Vitals [07/23/22 1139]  Enc Vitals Group     BP      Pulse      Resp      Temp      Temp src      SpO2      Weight      Height      Head Circumference      Peak Flow      Pain Score 0     Pain Loc      Pain Edu?      Excl. in [place]?    No data found.  Updated Vital Signs BP 132/68 (BP Location: Left  Arm)   Pulse 60   Temp 98.2 F (36.8 C) (Oral)   Resp 16   SpO2 94%    Physical Exam Vitals and nursing note reviewed.  Constitutional:      General: He is not in acute distress.    Appearance: Normal appearance.  HENT:     Nose: Congestion present. No rhinorrhea.     Mouth/Throat:     Mouth: Mucous membranes are moist.     Pharynx: Oropharynx is clear. No posterior oropharyngeal erythema.  Cardiovascular:     Rate and Rhythm: Normal rate and regular rhythm.  Pulmonary:     Effort: Pulmonary effort is normal. No respiratory distress.     Breath sounds: Wheezing and rales present.     Comments: Sounds improved after coughing Lymphadenopathy:     Cervical: No cervical adenopathy.  Skin:    General: Skin is warm and dry.  Neurological:     Mental Status: He is alert and oriented to person, place, and time.     UC Treatments / Results  Labs (all labs ordered are listed, but only abnormal results are displayed) Labs Reviewed  SARS CORONAVIRUS 2 (TAT 6-24 HRS)    EKG  Radiology DG Chest 2 View  Result Date: 07/23/2022 CLINICAL DATA:  Cough chills, and body aches for 4 days. EXAM: CHEST - 2 VIEW COMPARISON:  05/30/2022 FINDINGS: The heart size and mediastinal contours are within normal limits. Both lungs are clear. The visualized skeletal structures are unremarkable. IMPRESSION: No active cardiopulmonary disease. Electronically Signed   By: Marlaine Hind M.D.   On: 07/23/2022 12:44    Procedures Procedures   Medications Ordered in UC Medications - No data to display  Initial Impression / Assessment and Plan / UC Course  I have reviewed the triage vital signs and the nursing notes.  Pertinent labs & imaging results that were available during my care of the patient were reviewed by me and considered in my medical decision making (see chart for details).  Covid test pending. Today is day 4 of symptoms. If positive, antivirals. GFR >60  Lung sounds improved after deep  coughing, however still lingered and I recommended imaging. Chest xray negative.  Discussed mucinex 600 mg BID for congestion/cough Tylenol for body aches, fever Return precautions discussed. Patient agrees to plan  Final Clinical Impressions(s) / UC Diagnoses   Final diagnoses:  Viral URI with cough  Discharge Instructions      I recommend symptomatic care with mucinex twice daily. Drink lots of water with it!  We will call if the covid test is positive, and send anti-virals to your pharmacy.  Please return to the urgent care if symptoms do not improve, or go to the emergency department if symptoms worsen.     ED Prescriptions   None    PDMP not reviewed this encounter.   Tennyson Kallen, Lurena Joiner, PA-C 07/23/22 1320

## 2022-07-24 LAB — SARS CORONAVIRUS 2 (TAT 6-24 HRS): SARS Coronavirus 2: NEGATIVE

## 2022-08-07 ENCOUNTER — Other Ambulatory Visit: Payer: Self-pay | Admitting: Family Medicine

## 2022-08-07 DIAGNOSIS — I10 Essential (primary) hypertension: Secondary | ICD-10-CM

## 2022-10-04 NOTE — Progress Notes (Signed)
06/30/22- 71 yoM never smoker for sleep evaluation courtesy of Dr  Loralyn Freshwater with concern of OSA Medical problem list includes HTN, GERD, DM2, BPH, Morbid Obesity,  NPSG 05/16/22- AHI 37.5/ hr, desaturation to 80%, body weight 245 lbs Epworth score-0 Body weight today-261 lbs Covid vax-3 Phizer Flu vax-had He describes being tired often but blames frequent nocturia due to BPH.  Wife complains to him of his snoring and since him to sleep on the sofa. He denies history of ENT surgery, heart or lung disease.  Denies family history of OSA.  Treated for hypertension.  Does ground work/maintenance.  No routine sleep med.  10/06/22- 71 yoM never smoker for sleep evaluation courtesy of Dr  Loralyn Freshwater with concern of OSA Medical problem list includes HTN, GERD, DM2, BPH, Morbid Obesity,  CPAP auto 5-20/ [facility]   ordered 06/30/22 Download compliance- Body weight today-264 lbs Covid vax-3 Phizer Flu vax- ED 07/23/22- viral URI neg Covid,  ------Patient states he has not been using machine since around thanksgiving. States he was sick a few times Got off CPAP while dealing with 2 episodes of sustained viral respiratory illness.Now resolved. Did sleep better with CPAP but had trouble getting comfortable with FF mask. Not using humidifier. Some difficulty initiating sleep - agreed a sleep med might help occasionally. Needs to assess with BPH-related nocturia. CXR 07/23/22-  IMPRESSION: No active cardiopulmonary disease.   ROS-see HPI   + = positive Constitutional:    weight loss, night sweats, fevers, chills, fatigue, lassitude. HEENT:    headaches, difficulty swallowing, tooth/dental problems, sore throat,       sneezing, itching, ear ache, nasal congestion, post nasal drip, snoring CV:    chest pain, orthopnea, PND, swelling in lower extremities, anasarca,                                   dizziness, palpitations Resp:   shortness of breath with exertion or at rest.                 productive cough,   non-productive cough, coughing up of blood.              change in color of mucus.  wheezing.   Skin:    rash or lesions. GI:  No-   heartburn, indigestion, abdominal pain, nausea, vomiting, diarrhea,                 change in bowel habits, loss of appetite GU: dysuria, change in color of urine, no urgency or frequency.   flank pain. MS:   joint pain, stiffness, decreased range of motion, back pain. Neuro-     nothing unusual Psych:  change in mood or affect.  depression or anxiety.   memory loss.  OBJ- Physical Exam General- Alert, Oriented, Affect-appropriate, Distress- none acute, + obese Skin- rash-none, lesions- none, excoriation- none Lymphadenopathy- none Head- atraumatic            Eyes- Gross vision intact, PERRLA, conjunctivae and secretions clear            Ears- Hearing, canals-normal            Nose- Clear, no-Septal dev, mucus, polyps, erosion, perforation             Throat- Mallampati IV , mucosa clear , drainage- none, tonsils- atrophic, + teeth Neck- flexible , trachea midline, no stridor , thyroid nl, carotid no bruit  Chest - symmetrical excursion , unlabored           Heart/CV- RRR , no murmur , no gallop  , no rub, nl s1 s2                           - JVD- none , edema- none, stasis changes- none, varices- none           Lung- clear to P&A, wheeze- none, cough- none , dullness-none, rub- none           Chest wall-  Abd-  Br/ Gen/ Rectal- Not done, not indicated Extrem- cyanosis- none, clubbing, none, atrophy- none, strength- nl Neuro- grossly intact to observation

## 2022-10-06 ENCOUNTER — Encounter: Payer: Self-pay | Admitting: Internal Medicine

## 2022-10-06 ENCOUNTER — Ambulatory Visit: Payer: Commercial Managed Care - PPO | Admitting: Internal Medicine

## 2022-10-06 VITALS — BP 128/86 | HR 65 | Ht 67.5 in | Wt 264.0 lb

## 2022-10-06 DIAGNOSIS — F5101 Primary insomnia: Secondary | ICD-10-CM

## 2022-10-06 DIAGNOSIS — G4733 Obstructive sleep apnea (adult) (pediatric): Secondary | ICD-10-CM

## 2022-10-06 DIAGNOSIS — G4700 Insomnia, unspecified: Secondary | ICD-10-CM | POA: Insufficient documentation

## 2022-10-06 MED ORDER — ZOLPIDEM TARTRATE 10 MG PO TABS: 10.0000 mg | ORAL_TABLET | Freq: Every evening | ORAL | 5 refills | Status: DC | PRN

## 2022-10-06 NOTE — Assessment & Plan Note (Signed)
Had started well with CPAP until he got sick. Ready now to get restarted. Plan- resume CPAP auto 5-20, refit mask for comfort

## 2022-10-06 NOTE — Assessment & Plan Note (Signed)
Discussed. Frequent nocturia due to BPH contributes.  Try ambien 5 mg for occasional use

## 2022-10-06 NOTE — Patient Instructions (Signed)
Order- DME [facility]- please refit CPAP mask of choice for comfort Continue auto 5-20, humidifier, supplies, AirView/ card  Script sent to try ambien for sleep   1 at bedtime if needed

## 2022-11-03 ENCOUNTER — Other Ambulatory Visit: Payer: Self-pay | Admitting: Family Medicine

## 2022-11-03 DIAGNOSIS — I10 Essential (primary) hypertension: Secondary | ICD-10-CM

## 2022-12-07 ENCOUNTER — Ambulatory Visit: Payer: Commercial Managed Care - PPO | Admitting: Adult Health

## 2022-12-07 VITALS — BP 120/60 | HR 71 | Temp 98.2°F | Ht 67.75 in | Wt 258.0 lb

## 2022-12-07 DIAGNOSIS — L0291 Cutaneous abscess, unspecified: Secondary | ICD-10-CM | POA: Diagnosis not present

## 2022-12-07 MED ORDER — CEPHALEXIN 500 MG PO CAPS: 500.0000 mg | ORAL_CAPSULE | Freq: Two times a day (BID) | ORAL | 0 refills | Status: AC

## 2022-12-07 NOTE — Progress Notes (Signed)
Subjective:    Patient ID: Javier [name], male    DOB: 1951/02/28, 72 y.o.   MRN: [id number]  HPI 72 year old male who  has a past medical history of Adverse effect of benzodiazepine (10/15/2011), BENIGN PROSTATIC HYPERTROPHY (02/20/2007), Depression, Diabetes mellitus, ELEVATED PROSTATE SPECIFIC ANTIGEN (02/26/2009), ERECTILE DYSFUNCTION, ORGANIC (12/30/2009), FASCIITIS, PLANTAR (09/30/2007), GERD (02/20/2007), Heart murmur, HYPERTENSION (02/20/2007), HYPOKALEMIA (03/02/2009), and Obesity.  He presents to the office for concern of a lump under his right breast that he noticed yesterday. He denies pain or drainage. 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   FASCIITIS, PLANTAR 09/30/2007   GERD 02/20/2007   Heart murmur    HYPERTENSION 02/20/2007   HYPOKALEMIA 03/02/2009   Obesity     Social History   Socioeconomic History   Marital status: Married    Spouse name: Not on file   Number of children: Not on file   Years of education: Not on file   Highest education level: Not on file  Occupational History   Not on file  Tobacco Use   Smoking status: Never   Smokeless tobacco: Never  Substance and Sexual Activity   Alcohol use: No   Drug use: No   Sexual activity: Not on file  Other Topics Concern   Not on file  Social History Narrative   Not on file   Social Determinants of Health   Financial Resource Strain: Not on file  Food Insecurity: Not on file  Transportation Needs: Not on file  Physical Activity: Not on file  Stress: Not on file  Social Connections: Not on file  Intimate Partner Violence: Not on file    Past Surgical History:  Procedure Laterality Date    KNEE SURGERY Bilateral    torn cartiledge    Family History  Problem Relation Age of Onset   Heart failure Mother    Other Father        no known med history   High blood pressure Sister    High blood pressure Sister     Allergies  Allergen Reactions   Atorvastatin     headache   Catapres [Clonidine Hcl]     Vivid dreams and loss of libido   Pravastatin Sodium     headache    Current Outpatient Medications on File Prior to Visit  Medication Sig Dispense Refill   hydrALAZINE (APRESOLINE) 50 MG tablet TAKE 1 TABLET BY MOUTH THREE TIMES A DAY 270 tablet 3   metoprolol succinate (TOPROL-XL) 100 MG 24 hr tablet TAKE 1 TABLET BY MOUTH EVERY DAY WITH OR IMMEDIATELY FOLLOWING A MEAL 90 tablet 0   mirabegron ER (MYRBETRIQ) 25 MG TB24 tablet Take 1 tablet (25 mg total) by mouth daily. 14 tablet 0   Olmesartan-amLODIPine-HCTZ 40-10-25 MG TABS TAKE 1 TABLET BY MOUTH EVERY DAY 90 tablet 1   potassium chloride SA (KLOR-CON M20) 20 MEQ tablet TAKE 1 TABLET BY MOUTH EVERY DAY 90 tablet 1   rosuvastatin (CRESTOR) 5 MG tablet TAKE 1 TABLET (5 MG TOTAL) BY MOUTH DAILY. 90 tablet 0   sildenafil (VIAGRA) 100 MG tablet Take 0.5-1 tablets (50-100  mg total) by mouth daily as needed for erectile dysfunction. 90 tablet 1   spironolactone (ALDACTONE) 25 MG tablet TAKE 1 TABLET (25 MG TOTAL) BY MOUTH DAILY. 90 tablet 1   tamsulosin (FLOMAX) 0.4 MG CAPS capsule TAKE 1 CAPSULE BY MOUTH EVERY DAY 90 capsule 3   Testosterone (ANDROGEL) 20.25 MG/1.25GM (1.62%) GEL Place 1 packet onto the skin daily. 30 packet 5   zolpidem (AMBIEN) 10 MG tablet Take 1 tablet (10 mg total) by mouth at bedtime as needed for sleep. 30 tablet 5   No current facility-administered medications on file prior to visit.    BP 120/60   Pulse 71   Temp 98.2 F (36.8 C) (Oral)   Ht 5' 7.75" (1.721 m)   Wt 258 lb (117 kg)   SpO2 95%   BMI 39.52 kg/m       Objective:   Physical Exam Vitals and nursing note reviewed.   Constitutional:      Appearance: Normal appearance.  Musculoskeletal:        General: Normal range of motion.  Skin:    General: Skin is warm and dry.     Capillary Refill: Capillary refill takes less than 2 seconds.     Comments: Dime sized non fluctuant abscess noted in under right breast  Neurological:     General: No focal deficit present.     Mental Status: He is oriented to person, place, and time.  Psychiatric:        Mood and Affect: Mood normal.        Behavior: Behavior normal.        Thought Content: Thought content normal.        Judgment: Judgment normal.        Assessment & Plan:  1. Abscess - Advised warm compress. Will send in keflex  - Follow up if it becomes soft or starts draining  - cephALEXin (KEFLEX) 500 MG capsule; Take 1 capsule (500 mg total) by mouth 2 (two) times daily for 7 days.  Dispense: 14 capsule; Refill: 0  Dorothyann Peng, NP

## 2022-12-21 ENCOUNTER — Other Ambulatory Visit: Payer: Self-pay | Admitting: Family Medicine

## 2022-12-21 DIAGNOSIS — I10 Essential (primary) hypertension: Secondary | ICD-10-CM

## 2022-12-22 ENCOUNTER — Other Ambulatory Visit: Payer: Self-pay | Admitting: *Deleted

## 2022-12-22 ENCOUNTER — Ambulatory Visit: Payer: Commercial Managed Care - PPO | Admitting: Family Medicine

## 2022-12-22 VITALS — BP 102/50 | HR 65 | Temp 97.7°F | Ht 67.75 in | Wt 258.3 lb

## 2022-12-22 DIAGNOSIS — E118 Type 2 diabetes mellitus with unspecified complications: Secondary | ICD-10-CM

## 2022-12-22 DIAGNOSIS — L0291 Cutaneous abscess, unspecified: Secondary | ICD-10-CM | POA: Diagnosis not present

## 2022-12-22 DIAGNOSIS — R7989 Other specified abnormal findings of blood chemistry: Secondary | ICD-10-CM | POA: Diagnosis not present

## 2022-12-22 DIAGNOSIS — I10 Essential (primary) hypertension: Secondary | ICD-10-CM

## 2022-12-22 DIAGNOSIS — Z1211 Encounter for screening for malignant neoplasm of colon: Secondary | ICD-10-CM

## 2022-12-22 LAB — COMPREHENSIVE METABOLIC PANEL
ALT: 21 U/L (ref 0–53)
AST: 24 U/L (ref 0–37)
Albumin: 3.8 g/dL (ref 3.5–5.2)
Alkaline Phosphatase: 60 U/L (ref 39–117)
BUN: 22 mg/dL (ref 6–23)
CO2: 26 mEq/L (ref 19–32)
Calcium: 9.3 mg/dL (ref 8.4–10.5)
Chloride: 102 mEq/L (ref 96–112)
Creatinine, Ser: 0.93 mg/dL (ref 0.40–1.50)
GFR: 82.31 mL/min (ref >60.00)
Glucose, Bld: 129 mg/dL — ABNORMAL HIGH (ref 70–99)
Potassium: 3.9 mEq/L (ref 3.5–5.1)
Sodium: 136 mEq/L (ref 135–145)
Total Bilirubin: 0.7 mg/dL (ref 0.2–1.2)
Total Protein: 7.5 g/dL (ref 6.0–8.3)

## 2022-12-22 LAB — POCT GLYCOSYLATED HEMOGLOBIN (HGB A1C): Hemoglobin A1C: 6 % — AB (ref 4.0–5.6)

## 2022-12-22 LAB — TSH: TSH: 1.57 u[IU]/mL (ref 0.35–5.50)

## 2022-12-22 LAB — LIPID PANEL
Cholesterol: 122 mg/dL (ref 0–200)
HDL: 45.7 mg/dL (ref >39.00)
LDL Cholesterol: 63 mg/dL (ref 0–99)
NonHDL: 76.24
Total CHOL/HDL Ratio: 3
Triglycerides: 68 mg/dL (ref 0.0–149.0)
VLDL: 13.6 mg/dL (ref 0.0–40.0)

## 2022-12-22 LAB — TESTOSTERONE: Testosterone: 189.83 ng/dL — ABNORMAL LOW (ref 300.00–890.00)

## 2022-12-22 MED ORDER — ROSUVASTATIN CALCIUM 5 MG PO TABS: 5.0000 mg | ORAL_TABLET | Freq: Every day | ORAL | 1 refills | Status: DC

## 2022-12-22 MED ORDER — TAMSULOSIN HCL 0.4 MG PO CAPS: 0.4000 mg | ORAL_CAPSULE | Freq: Every day | ORAL | 1 refills | Status: DC

## 2022-12-22 MED ORDER — OLMESARTAN-AMLODIPINE-HCTZ 40-10-25 MG PO TABS
1.0000 | ORAL_TABLET | Freq: Every day | ORAL | 1 refills | Status: DC
Start: 2022-12-22 — End: 2023-01-12

## 2022-12-22 MED ORDER — METOPROLOL SUCCINATE ER 100 MG PO TB24: ORAL_TABLET | ORAL | 1 refills | Status: DC

## 2022-12-22 MED ORDER — HYDRALAZINE HCL 50 MG PO TABS: 50.0000 mg | ORAL_TABLET | Freq: Three times a day (TID) | ORAL | 1 refills | Status: DC

## 2022-12-22 MED ORDER — SPIRONOLACTONE 25 MG PO TABS: 25.0000 mg | ORAL_TABLET | Freq: Every day | ORAL | 1 refills | Status: DC

## 2022-12-22 MED ORDER — CEPHALEXIN 500 MG PO CAPS: 500.0000 mg | ORAL_CAPSULE | Freq: Two times a day (BID) | ORAL | 0 refills | Status: AC

## 2022-12-22 NOTE — Telephone Encounter (Signed)
Ok to refill 

## 2022-12-22 NOTE — Progress Notes (Unsigned)
Established Patient Office Visit  Subjective   Patient ID: Javier [name], male    DOB: 1951/01/28  Age: 72 y.o. MRN: [id number]  Chief Complaint  Patient presents with   Medical Management of Chronic Issues    Pt is here for follow up today. He reports he had an abscess on his chest last week, was given keflex 500 mg twice daily. States that the knot has gotten better but it is still there, it has not opened or drained at all. Has gone down in size. No fever or chills.   DM-- pt A1C performed in office today and is 6.0. reports he is trying to watch his diet. Not currently on medication at this time.   HTN -- BP in office performed and is well controlled. He  reports no side effects to the medications, no chest pain, SOB, dizziness or headaches. He has a BP cuff at home and is checking BP regularly, reports they are in the normal range. He is due for his annual labs today. Ordering CMP and lipid panel.   Low T-- pt never started the testosterone that was prescribed, he reports some problems with libido but he is worried about the risks associated with the medication. We discussed this and I recommended ordering a new testosterone level to confirm.      Current Outpatient Medications  Medication Instructions   hydrALAZINE (APRESOLINE) 50 mg, Oral, 3 times daily   metoprolol succinate (TOPROL-XL) 100 MG 24 hr tablet TAKE 1 TABLET BY MOUTH EVERY DAY WITH OR IMMEDIATELY FOLLOWING A MEAL   metoprolol succinate (TOPROL-XL) 100 MG 24 hr tablet TAKE 1 TABLET BY MOUTH EVERY DAY WITH OR IMMEDIATELY FOLLOWING A MEAL   mirabegron ER (MYRBETRIQ) 25 mg, Oral, Daily   Multiple Vitamin (MULTIVITAMIN) capsule 1 capsule, Oral, Daily   Olmesartan-amLODIPine-HCTZ 40-10-25 MG TABS 1 tablet, Oral, Daily   Olmesartan-amLODIPine-HCTZ 40-10-25 MG TABS 1 tablet, Oral, Daily   potassium chloride SA (KLOR-CON M20) 20 MEQ tablet TAKE 1 TABLET BY MOUTH EVERY DAY   rosuvastatin (CRESTOR) 5 mg, Oral, Daily    rosuvastatin (CRESTOR) 5 mg, Oral, Daily   sildenafil (VIAGRA) 50-100 mg, Oral, Daily PRN   spironolactone (ALDACTONE) 25 mg, Oral, Daily   spironolactone (ALDACTONE) 25 mg, Oral, Daily   tamsulosin (FLOMAX) 0.4 mg, Oral, Daily   Testosterone (ANDROGEL) 20.25 MG/1.25GM (1.62%) GEL 1 packet, Transdermal, Daily   zolpidem (AMBIEN) 10 mg, Oral, At bedtime PRN    Patient Active Problem List   Diagnosis Date Noted   Insomnia 10/06/2022   OSA (obstructive sleep apnea) 07/04/2022   Fatigue 05/16/2022   Morbid obesity 05/16/2022   Pain due to onychomycosis of toenails of both feet 03/10/2022   Type II diabetes mellitus with complication 10/04/2015   Anxiety disorder 06/02/2011   ERECTILE DYSFUNCTION, 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     Objective:     BP (!) 102/50 (BP Location: Left Arm, Patient Position: Sitting, Cuff Size: Large)   Pulse 65   Temp 97.7 F (36.5 C) (Oral)   Ht 5' 7.75" (1.721 m)   Wt 258 lb 4.8 oz (117.2 kg)   SpO2 97%   BMI 39.56 kg/m    Physical Exam Vitals reviewed.  Constitutional:      Appearance: Normal appearance. He is well-groomed. He is obese.  HENT:     Head: Normocephalic and atraumatic.  Eyes:     Conjunctiva/sclera: Conjunctivae normal.  Cardiovascular:     Rate and Rhythm: Normal rate and regular rhythm.     Pulses: Normal pulses.     Heart sounds: S1 normal and S2 normal. No murmur heard. Pulmonary:     Effort: Pulmonary effort is normal.     Breath sounds: Normal breath sounds and air entry. No rales.  Abdominal:     General: Bowel sounds are normal.     Tenderness: There is no abdominal tenderness.  Musculoskeletal:     Right lower leg: No edema.     Left lower leg:  No edema.  Feet:     Right foot:     Toenail Condition: Fungal disease present.    Left foot:     Toenail Condition: Fungal disease present. Neurological:     General: No focal deficit present.     Mental Status: He is alert and oriented to person, place, and time.     Gait: Gait is intact.  Psychiatric:        Mood and Affect: Mood and affect normal.        Behavior: Behavior normal.   Last hemoglobin A1c Lab Results  Component Value Date   HGBA1C 6.0 (A) 12/22/2022      The ASCVD Risk score (Arnett DK, et al., 2019) failed to calculate for the following reasons:   The valid total cholesterol range is 130 to 320 mg/dL    Assessment & Plan:   Problem List Items Addressed This Visit       Unprioritized   Essential hypertension    Current hypertension medications:       Sig   sildenafil (VIAGRA) 100 MG tablet (Taking) Take 0.5-1 tablets (50-100 mg total) by mouth daily as needed for erectile dysfunction.   hydrALAZINE (APRESOLINE) 50 MG tablet (Taking/Discontinued) TAKE 1 TABLET BY MOUTH THREE TIMES A DAY   metoprolol succinate (TOPROL-XL) 100 MG 24 hr tablet TAKE 1 TABLET BY MOUTH EVERY DAY WITH OR IMMEDIATELY FOLLOWING A MEAL   Olmesartan-amLODIPine-HCTZ 40-10-25 MG TABS Take 1 tablet by mouth daily.   spironolactone (ALDACTONE) 25 MG tablet Take 1 tablet (25 mg total) by mouth daily.     BP is well controlled on the above medications, will continue these as prescribed.       Relevant Medications   metoprolol succinate (TOPROL-XL) 100 MG 24 hr tablet   Olmesartan-amLODIPine-HCTZ 40-10-25 MG TABS   rosuvastatin (CRESTOR) 5 MG tablet   spironolactone (ALDACTONE) 25 MG tablet   Other Relevant Orders   CMP (Completed)   TSH (Completed)   Type II diabetes mellitus with complication - Primary    A1C is in the prediabetic range today, he is not currently on any medications for this, it is likely that he is only a prediabetic. Will continue to monitor this every 6  months.      Relevant Medications   Olmesartan-amLODIPine-HCTZ 40-10-25 MG TABS   rosuvastatin (CRESTOR) 5 MG tablet   Other Relevant Orders   POC HgB A1c (Completed)   Lipid Panel (Completed)   Other Visit Diagnoses     Abscess       Low testosterone in male       patient never started the testosterone, I discussed the risks/benefits of starting this medication, will check new testosterone level today  Relevant Orders   Testosterone (Completed)   Colon cancer screening       Relevant Orders   Cologuard       Return in about 6 months (around 06/23/2023) for HTN.    Karie Georges, MD

## 2022-12-26 NOTE — Assessment & Plan Note (Signed)
Current hypertension medications:       Sig   sildenafil (VIAGRA) 100 MG tablet (Taking) Take 0.5-1 tablets (50-100 mg total) by mouth daily as needed for erectile dysfunction.   hydrALAZINE (APRESOLINE) 50 MG tablet (Taking/Discontinued) TAKE 1 TABLET BY MOUTH THREE TIMES A DAY   metoprolol succinate (TOPROL-XL) 100 MG 24 hr tablet TAKE 1 TABLET BY MOUTH EVERY DAY WITH OR IMMEDIATELY FOLLOWING A MEAL   Olmesartan-amLODIPine-HCTZ 40-10-25 MG TABS Take 1 tablet by mouth daily.   spironolactone (ALDACTONE) 25 MG tablet Take 1 tablet (25 mg total) by mouth daily.      BP is well controlled on the above medications, will continue these as prescribed.

## 2022-12-26 NOTE — Assessment & Plan Note (Signed)
A1C is in the prediabetic range today, he is not currently on any medications for this, it is likely that he is only a prediabetic. Will continue to monitor this every 6 months. 

## 2022-12-28 MED ORDER — TESTOSTERONE 20.25 MG/1.25GM (1.62%) TD GEL: 1.0000 | Freq: Every day | TRANSDERMAL | 2 refills | Status: DC

## 2022-12-28 NOTE — Addendum Note (Signed)
Addended by: Johnella Moloney on: 12/28/2022 01:44 PM   Modules accepted: Orders

## 2022-12-28 NOTE — Addendum Note (Signed)
Addended by: Karie Georges on: 12/28/2022 10:44 AM   Modules accepted: Orders

## 2022-12-28 NOTE — Progress Notes (Signed)
Rx sent, he will need to return for a repeat testosterone in 1 month

## 2023-01-10 NOTE — Progress Notes (Signed)
HPI M never smoker for sleep evaluation courtesy of Dr  Nira Conn with concern of OSA Medical problem list includes HTN, GERD, DM2, BPH, Morbid Obesity,  NPSG 05/16/22- AHI 37.5/ hr, desaturation to 80%, body weight 245 lbs  ===========================================================================  10/06/22- 71 yoM never smoker followed for OSA, complicated by  HTN, GERD, DM2, BPH, Morbid Obesity,  CPAP auto 5-20/ [facility]   ordered 06/30/22 Download compliance- Body weight today-264 lbs Covid vax-3 Phizer Flu vax- ED 07/23/22- viral URI neg Covid,  ------Patient states he has not been using machine since around thanksgiving. States he was sick a few times Got off CPAP while dealing with 2 episodes of sustained viral respiratory illness.Now resolved. Did sleep better with CPAP but had trouble getting comfortable with FF mask. Not using humidifier. Some difficulty initiating sleep - agreed a sleep med might help occasionally. Needs to assess with BPH-related nocturia. CXR 07/23/22-  IMPRESSION: No active cardiopulmonary disease.  01/12/23-  72 yoM never smoker followed for OSA, complicated by  HTN, GERD, DM2, BPH, Morbid Obesity,  CPAP auto 5-20/ [facility]   ordered 06/30/22 Download compliance-57%, AHI 1.6/ hr      every night but many short nights Body weight today-260 lbs Download reviewed and compliance goals discussed.  He has been leaving mask off after returning from bathroom during the night.  Wearing CPAP every night and sleeping better. 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 He denies breathing problems or other significant health changes since last here.  ROS-see HPI   + = positive Constitutional:    weight loss, night sweats, fevers, chills, fatigue, lassitude. HEENT:    headaches, difficulty swallowing, tooth/dental problems, sore throat,       sneezing, itching, ear ache, nasal congestion, post nasal drip, snoring CV:    chest pain, orthopnea, PND, swelling in lower  extremities, anasarca,                                   dizziness, palpitations Resp:   shortness of breath with exertion or at rest.                productive cough,   non-productive cough, coughing up of blood.              change in color of mucus.  wheezing.   Skin:    rash or lesions. GI:  No-   heartburn, indigestion, abdominal pain, nausea, vomiting, diarrhea,                 change in bowel habits, loss of appetite GU: dysuria, change in color of urine, no urgency or frequency.   flank pain. MS:   joint pain, stiffness, decreased range of motion, back pain. Neuro-     nothing unusual Psych:  change in mood or affect.  depression or anxiety.   memory loss.  OBJ- Physical Exam General- Alert, Oriented, Affect-appropriate, Distress- none acute, + obese Skin- rash-none, lesions- none, excoriation- none Lymphadenopathy- none Head- atraumatic            Eyes- Gross vision intact, PERRLA, conjunctivae and secretions clear            Ears- Hearing, canals-normal            Nose- Clear, no-Septal dev, mucus, polyps, erosion, perforation             Throat- Mallampati IV , mucosa clear , drainage- none,  tonsils- atrophic, + teeth Neck- flexible , trachea midline, no stridor , thyroid nl, carotid no bruit Chest - symmetrical excursion , unlabored           Heart/CV- RRR , no murmur , no gallop  , no rub, nl s1 s2                           - JVD- none , edema- none, stasis changes- none, varices- none           Lung- clear to P&A, wheeze- none, cough- none , dullness-none, rub- none           Chest wall-  Abd-  Br/ Gen/ Rectal- Not done, not indicated Extrem- cyanosis- none, clubbing, none, atrophy- none, strength- nl Neuro- grossly intact to observation

## 2023-01-11 ENCOUNTER — Telehealth: Payer: Self-pay | Admitting: Family Medicine

## 2023-01-11 NOTE — Telephone Encounter (Signed)
Pt is calling and Testosterone (ANDROGEL) 20.25 MG/1.25GM (1.62%) GEL  needs PA

## 2023-01-12 ENCOUNTER — Encounter: Payer: Self-pay | Admitting: Internal Medicine

## 2023-01-12 ENCOUNTER — Ambulatory Visit: Payer: Commercial Managed Care - PPO | Admitting: Internal Medicine

## 2023-01-12 VITALS — BP 136/70 | HR 59 | Ht 68.0 in | Wt 260.6 lb

## 2023-01-12 DIAGNOSIS — F5101 Primary insomnia: Secondary | ICD-10-CM | POA: Diagnosis not present

## 2023-01-12 DIAGNOSIS — G4733 Obstructive sleep apnea (adult) (pediatric): Secondary | ICD-10-CM | POA: Diagnosis not present

## 2023-01-12 NOTE — Assessment & Plan Note (Signed)
Good sleep hygiene discussed.

## 2023-01-12 NOTE — Assessment & Plan Note (Signed)
Emphasize diet exercise

## 2023-01-12 NOTE — Patient Instructions (Signed)
Try to wear your CPAP at least 4 hours every night if you can  We can leave the setting at 5-20  Please call igf we can help

## 2023-01-12 NOTE — Assessment & Plan Note (Signed)
Benefits from CPAP with good control.  Compliance goals emphasized.  Letter written. Plan-continue auto 5-20.  Aim for at least 4 hours a night.

## 2023-01-24 ENCOUNTER — Other Ambulatory Visit (HOSPITAL_COMMUNITY): Payer: Self-pay

## 2023-01-24 ENCOUNTER — Telehealth: Payer: Self-pay

## 2023-01-24 NOTE — Telephone Encounter (Signed)
Prior Authorization for Testosterone 20.25 MG/1.25GM(1.62%) gel has been approved with [employer].    PA# [id number] Effective dates: 01/22/23 through 01/22/24

## 2023-01-24 NOTE — Telephone Encounter (Addendum)
Patient Advocate Encounter  Prior Authorization for Testosterone 20.25 MG/1.25GM(1.62%) gel has been approved with [employer].    PA# [id number] Key: [id number]  Effective dates: 01/22/23 through 01/22/24

## 2023-02-02 ENCOUNTER — Other Ambulatory Visit: Payer: Self-pay | Admitting: Family Medicine

## 2023-02-02 ENCOUNTER — Other Ambulatory Visit: Payer: Commercial Managed Care - PPO

## 2023-02-02 DIAGNOSIS — R7989 Other specified abnormal findings of blood chemistry: Secondary | ICD-10-CM

## 2023-02-02 DIAGNOSIS — Z1211 Encounter for screening for malignant neoplasm of colon: Secondary | ICD-10-CM

## 2023-02-02 LAB — TESTOSTERONE: Testosterone: 270.22 ng/dL — ABNORMAL LOW (ref 300.00–890.00)

## 2023-02-02 MED ORDER — TESTOSTERONE 20.25 MG/1.25GM (1.62%) TD GEL: 1.0000 | Freq: Every day | TRANSDERMAL | 2 refills | Status: DC

## 2023-02-02 NOTE — Telephone Encounter (Signed)
New order placed for Cologuard

## 2023-02-02 NOTE — Telephone Encounter (Signed)
Ok to put in new order for the cologuard test-- maybe the kit got lost in the mail?

## 2023-02-02 NOTE — Addendum Note (Signed)
Addended by: Christy Sartorius on: 02/02/2023 11:35 AM   Modules accepted: Orders

## 2023-02-02 NOTE — Telephone Encounter (Signed)
Pt came in and stated he never received his colon guard. He would like to check on the status of that.   Prescription Request  02/02/2023  LOV: 12/22/2022  What is the name of the medication or equipment? Testosterone Gel 1.62%. Pt states his insurance has approved prescription and would like it resent to the pharmacy.  Have you contacted your pharmacy to request a refill? Yes   Which pharmacy would you like this sent to?  [id number] - [place], [place] - [address] AT CORNER OF [facility] [address] [place] [address] Phone: [phone] Fax: [phone]    Patient notified that their request is being sent to the clinical staff for review and that they should receive a response within 2 business days.   Please advise at Mobile [phone] (mobile)

## 2023-02-05 NOTE — Progress Notes (Signed)
The prescription was already sent, he just needs to call the pharmacy and have them fill it

## 2023-02-27 ENCOUNTER — Telehealth: Payer: Self-pay | Admitting: Family Medicine

## 2023-02-27 NOTE — Telephone Encounter (Signed)
Great! Ok to close

## 2023-02-27 NOTE — Telephone Encounter (Signed)
Pt call and stated he want dr.Michael to know that he have been taken the testosterone med for about 9 days and want her to know that it is working.

## 2023-04-05 LAB — COLOGUARD: COLOGUARD: NEGATIVE

## 2023-04-09 NOTE — Progress Notes (Signed)
We can repeat them at his next visit, unless he feels that the testosterone isn't helping him- if he feels like it is not working to improve his symptoms then we will need to increase his dose.

## 2023-04-10 ENCOUNTER — Other Ambulatory Visit: Payer: Self-pay

## 2023-04-10 ENCOUNTER — Emergency Department (HOSPITAL_COMMUNITY): Payer: Commercial Managed Care - PPO

## 2023-04-10 ENCOUNTER — Emergency Department (HOSPITAL_COMMUNITY)
Admission: EM | Admit: 2023-04-10 | Discharge: 2023-04-10 | Disposition: A | Discharge status: Home / Self Care | Payer: Commercial Managed Care - PPO | Attending: Emergency Medicine | Admitting: Emergency Medicine

## 2023-04-10 ENCOUNTER — Encounter (HOSPITAL_COMMUNITY): Payer: Self-pay

## 2023-04-10 DIAGNOSIS — R42 Dizziness and giddiness: Secondary | ICD-10-CM | POA: Insufficient documentation

## 2023-04-10 DIAGNOSIS — R251 Tremor, unspecified: Secondary | ICD-10-CM

## 2023-04-10 LAB — I-STAT CHEM 8, ED
BUN: 23 mg/dL (ref 8–23)
Calcium, Ion: 1.01 mmol/L — ABNORMAL LOW (ref 1.15–1.40)
Chloride: 107 mmol/L (ref 98–111)
Creatinine, Ser: 1 mg/dL (ref 0.61–1.24)
Glucose, Bld: 137 mg/dL — ABNORMAL HIGH (ref 70–99)
HCT: 39 % (ref 39.0–52.0)
Hemoglobin: 13.3 g/dL (ref 13.0–17.0)
Potassium: 4 mmol/L (ref 3.5–5.1)
Sodium: 138 mmol/L (ref 135–145)
TCO2: 23 mmol/L (ref 22–32)

## 2023-04-10 LAB — DIFFERENTIAL
Abs Immature Granulocytes: 0.01 10*3/uL (ref 0.00–0.07)
Basophils Absolute: 0 10*3/uL (ref 0.0–0.1)
Basophils Relative: 1 %
Eosinophils Absolute: 0.1 10*3/uL (ref 0.0–0.5)
Eosinophils Relative: 2 %
Immature Granulocytes: 0 %
Lymphocytes Relative: 25 %
Lymphs Abs: 1.5 10*3/uL (ref 0.7–4.0)
Monocytes Absolute: 0.7 10*3/uL (ref 0.1–1.0)
Monocytes Relative: 11 %
Neutro Abs: 3.8 10*3/uL (ref 1.7–7.7)
Neutrophils Relative %: 61 %

## 2023-04-10 LAB — CBC
HCT: 40.9 % (ref 39.0–52.0)
Hemoglobin: 13 g/dL (ref 13.0–17.0)
MCH: 28.1 pg (ref 26.0–34.0)
MCHC: 31.8 g/dL (ref 30.0–36.0)
MCV: 88.3 fL (ref 80.0–100.0)
Platelets: 243 10*3/uL (ref 150–400)
RBC: 4.63 MIL/uL (ref 4.22–5.81)
RDW: 14.6 % (ref 11.5–15.5)
WBC: 6.2 10*3/uL (ref 4.0–10.5)
nRBC: 0 % (ref 0.0–0.2)

## 2023-04-10 LAB — APTT: aPTT: 30 seconds (ref 24–36)

## 2023-04-10 LAB — COMPREHENSIVE METABOLIC PANEL
ALT: 21 U/L (ref 0–44)
AST: 21 U/L (ref 15–41)
Albumin: 3.1 g/dL — ABNORMAL LOW (ref 3.5–5.0)
Alkaline Phosphatase: 60 U/L (ref 38–126)
Anion gap: 10 (ref 5–15)
BUN: 22 mg/dL (ref 8–23)
CO2: 22 mmol/L (ref 22–32)
Calcium: 8.8 mg/dL — ABNORMAL LOW (ref 8.9–10.3)
Chloride: 105 mmol/L (ref 98–111)
Creatinine, Ser: 1.1 mg/dL (ref 0.61–1.24)
GFR, Estimated: >60 mL/min (ref >60)
Glucose, Bld: 137 mg/dL — ABNORMAL HIGH (ref 70–99)
Potassium: 4 mmol/L (ref 3.5–5.1)
Sodium: 137 mmol/L (ref 135–145)
Total Bilirubin: 0.7 mg/dL (ref 0.3–1.2)
Total Protein: 6.8 g/dL (ref 6.5–8.1)

## 2023-04-10 LAB — PROTIME-INR
INR: 1.1 (ref 0.8–1.2)
Prothrombin Time: 14.1 seconds (ref 11.4–15.2)

## 2023-04-10 LAB — CBG MONITORING, ED
Glucose-Capillary: 143 mg/dL — ABNORMAL HIGH (ref 70–99)
Glucose-Capillary: 103 mg/dL — ABNORMAL HIGH (ref 70–99)

## 2023-04-10 MED ORDER — GADOBUTROL 1 MMOL/ML IV SOLN
10.0000 mL | Freq: Once | INTRAVENOUS | Status: AC | PRN
  Administered 2023-04-10: 10 mL via INTRAVENOUS

## 2023-04-10 MED ORDER — SODIUM CHLORIDE 0.9% FLUSH: 3.0000 mL | Freq: Once | INTRAVENOUS | Status: DC

## 2023-04-10 NOTE — ED Triage Notes (Addendum)
Pt c/o dizziness, lightheaded, feels shaky, HA, pt states he feels like he's "off balance when he walks, eyes red"x2wks. Pt walked well to triage.

## 2023-04-10 NOTE — Discharge Instructions (Signed)
1.  At this time is unclear why you are having symptoms of tremulousness.  MRI does not show evidence of a stroke.  There may be many other causes.  Currently I do not see medications that are likely to be causing your symptoms.  Be very careful with your diet and only eat a limited amount of foods that you have tolerated well in the past.  Review all of your medications with your doctor and try to eliminate anything that you do not absolutely need.  This time however continue all of your regularly prescribed medications, especially your blood pressure medications. 2.  You have also been given referral to neurology to follow-up for tremors, as there are many possible causes. 3.  Return to emergency department immediately if you have new or worsening symptoms.

## 2023-04-10 NOTE — ED Provider Notes (Signed)
EMERGENCY DEPARTMENT AT [facility] Provider Note   CSN: [id number] Arrival date & time: 04/10/23  0915     History  Chief Complaint  Patient presents with   Dizziness    Javier [name] is a 72 y.o. male.  HPI Patient reports he has been having variable symptoms for about 2 weeks.  He is experiencing intermittent headaches.  He reports it is generalized or achy pressure in nature.  Not severe.  Currently not having much headache.  Patient reports he feels lightheaded intermittently.  This is more noticeable especially with standing or position changes.  He feels tremulous and shaky at times.  He also feels somewhat off balance as he walks.  He is noticed that his eyes seem more red for about 2 weeks.  Patient associates this with something he ate.  He does not remember what it was.  He associates it with something he ate is because about a year ago he ate at a [place] and immediately upon leaving had all of the symptoms come on at 1 time.  However, they did resolve and he did not require any specific treatment.  Never really identify what he thought the trigger was.    Home Medications Prior to Admission medications   Medication Sig Start Date End Date Taking? Authorizing Provider  hydrALAZINE (APRESOLINE) 50 MG tablet Take 1 tablet (50 mg total) by mouth 3 (three) times daily. 12/22/22   Karie Georges, MD  metoprolol succinate (TOPROL-XL) 100 MG 24 hr tablet TAKE 1 TABLET BY MOUTH EVERY DAY WITH OR IMMEDIATELY FOLLOWING A MEAL 12/25/22   Karie Georges, MD  mirabegron ER (MYRBETRIQ) 25 MG TB24 tablet Take 1 tablet (25 mg total) by mouth daily. 01/27/22   Wynn Banker, MD  Multiple Vitamin (MULTIVITAMIN) capsule Take 1 capsule by mouth daily.    [provider]  Olmesartan-amLODIPine-HCTZ 40-10-25 MG TABS TAKE 1 TABLET BY MOUTH EVERY DAY 12/25/22   Karie Georges, MD  potassium chloride SA (KLOR-CON M20) 20 MEQ tablet TAKE 1 TABLET BY  MOUTH EVERY DAY 12/25/22   Karie Georges, MD  rosuvastatin (CRESTOR) 5 MG tablet TAKE 1 TABLET (5 MG TOTAL) BY MOUTH DAILY. 12/25/22   Karie Georges, MD  sildenafil (VIAGRA) 100 MG tablet Take 0.5-1 tablets (50-100 mg total) by mouth daily as needed for erectile dysfunction. 03/03/22   Worthy Rancher B, FNP  spironolactone (ALDACTONE) 25 MG tablet TAKE 1 TABLET (25 MG TOTAL) BY MOUTH DAILY. 12/25/22   Karie Georges, MD  tamsulosin (FLOMAX) 0.4 MG CAPS capsule Take 1 capsule (0.4 mg total) by mouth daily. 12/22/22   Karie Georges, MD  Testosterone (ANDROGEL) 20.25 MG/1.25GM (1.62%) GEL Place 1 packet onto the skin daily. 02/02/23   Karie Georges, MD  zolpidem (AMBIEN) 10 MG tablet Take 1 tablet (10 mg total) by mouth at bedtime as needed for sleep. 10/06/22 11/05/22  Waymon Budge, MD      Allergies    Atorvastatin, Catapres [clonidine hcl], and Pravastatin sodium    Review of Systems   Review of Systems  Physical Exam Updated Vital Signs BP 134/61   Pulse (!) 58   Temp 98.3 F (36.8 C) (Oral)   Resp 18   Ht 5\' 8"  (1.727 m)   Wt 118.2 kg   SpO2 96%   BMI 39.62 kg/m  Physical Exam Constitutional:      Appearance: Normal appearance.  HENT:  Head: Normocephalic and atraumatic.     Mouth/Throat:     Pharynx: Oropharynx is clear.  Eyes:     Extraocular Movements: Extraocular movements intact.     Pupils: Pupils are equal, round, and reactive to light.     Comments: Subtle injection.  Not noticeably pronounced.  No drainage or discharge.  Cardiovascular:     Rate and Rhythm: Normal rate and regular rhythm.  Pulmonary:     Effort: Pulmonary effort is normal.     Breath sounds: Normal breath sounds.  Abdominal:     General: There is no distension.     Palpations: Abdomen is soft.     Tenderness: There is no abdominal tenderness. There is no guarding.  Musculoskeletal:        General: No deformity. Normal range of motion.  Skin:    General: Skin is warm and  dry.  Neurological:     General: No focal deficit present.     Mental Status: He is alert and oriented to person, place, and time.     Motor: No weakness.     Coordination: Coordination normal.  Psychiatric:        Mood and Affect: Mood normal.     ED Results / Procedures / Treatments   Labs (all labs ordered are listed, but only abnormal results are displayed) Labs Reviewed  COMPREHENSIVE METABOLIC PANEL - Abnormal; Notable for the following components:      Result Value   Glucose, Bld 137 (*)    Calcium 8.8 (*)    Albumin 3.1 (*)    All other components within normal limits  I-STAT CHEM 8, ED - Abnormal; Notable for the following components:   Glucose, Bld 137 (*)    Calcium, Ion 1.01 (*)    All other components within normal limits  CBG MONITORING, ED - Abnormal; Notable for the following components:   Glucose-Capillary 143 (*)    All other components within normal limits  PROTIME-INR  APTT  CBC  DIFFERENTIAL  ETHANOL  URINALYSIS, ROUTINE W REFLEX MICROSCOPIC  RAPID URINE DRUG SCREEN, HOSP PERFORMED  MAGNESIUM  PHOSPHORUS  TSH    EKG EKG Interpretation Date/Time:  Tuesday April 10 2023 09:32:03 EDT Ventricular Rate:  74 PR Interval:  216 QRS Duration:  87 QT Interval:  386 QTC Calculation: 429 R Axis:   44  Text Interpretation: Sinus rhythm Borderline prolonged PR interval Borderline ST elevation, lateral leads Confirmed by Tilden Fossa [phone]) on 04/11/2023 4:35:04 PM  Radiology MR Brain W and Wo Contrast  Result Date: 04/10/2023 CLINICAL DATA:  Neuro deficit, acute, stroke suspected. Dizziness, lightheaded, and headache. EXAM: MRI HEAD WITHOUT AND WITH CONTRAST TECHNIQUE: Multiplanar, multiecho pulse sequences of the brain and surrounding structures were obtained without and with intravenous contrast. CONTRAST:  10mL GADAVIST GADOBUTROL 1 MMOL/ML IV SOLN COMPARISON:  Head CT 04/10/2023. FINDINGS: Brain: No acute infarct or hemorrhage. Mild chronic  small-vessel disease. No hydrocephalus or extra-axial collection. No foci of abnormal susceptibility. No mass or abnormal enhancement. Vascular: Normal flow voids and vessel enhancement. Skull and upper cervical spine: Normal marrow signal and enhancement. Sinuses/Orbits: Unremarkable. Other: None. IMPRESSION: 1. No acute intracranial abnormality or mass. 2. Mild chronic small-vessel disease. Electronically Signed   By: Orvan Falconer M.D.   On: 04/10/2023 12:39   CT HEAD WO CONTRAST  Result Date: 04/10/2023 CLINICAL DATA:  72 year old male with dizziness. Neurologic deficit. EXAM: CT HEAD WITHOUT CONTRAST TECHNIQUE: Contiguous axial images were obtained from the base of the  skull through the vertex without intravenous contrast. RADIATION DOSE REDUCTION: This exam was performed according to the departmental dose-optimization program which includes automated exposure control, adjustment of the mA and/or kV according to patient size and/or use of iterative reconstruction technique. COMPARISON:  Head CT 12/04/2020. FINDINGS: Brain: Cerebral volume is within normal limits for age. No midline shift, ventriculomegaly, mass effect, evidence of mass lesion, intracranial hemorrhage or evidence of cortically based acute infarction. Gray-white matter differentiation is within normal limits for age throughout the brain. Vascular: No suspicious intracranial vascular hyperdensity. Extensive Calcified atherosclerosis at the skull base. Skull: Negative. Sinuses/Orbits: Visualized paranasal sinuses and mastoids are stable and well aerated. Other: Visualized orbits and scalp soft tissues are within normal limits. IMPRESSION: Normal for age noncontrast CT appearance of the brain. Electronically Signed   By: Odessa Fleming M.D.   On: 04/10/2023 10:34    Procedures Procedures    Medications Ordered in ED Medications  sodium chloride flush (NS) 0.9 % injection 3 mL (3 mLs Intravenous Not Given 04/10/23 1025)  gadobutrol (GADAVIST)  1 MMOL/ML injection 10 mL (10 mLs Intravenous Contrast Given 04/10/23 1205)    ED Course/ Medical Decision Making/ A&P                             Medical Decision Making Amount and/or Complexity of Data Reviewed Labs: ordered. Radiology: ordered.  Risk Prescription drug management.  Presents with symptoms that have been persistent on and off for about a year.  Patient first identify symptoms as being associated with a foodborne illness about a year ago.  Ultimately symptoms did resolve.  Patient reports however he is intermittently feeling like he is shaky and off balance.  This more acute episode that brought the patient to the emergency department includes vertiginous quality with headache and sometimes off balance.  Neurologic exam is nonfocal.  Patient's gait is steady.  No obvious cerebellar symptoms.  However with combination of neurologic symptoms of vertigo plus balance dysfunction and headache, will proceed with CT scan and rule out obvious bleed or mass.  Proceed with basic diagnostic evaluation.  Patient is afebrile without meningismus, hypertension or fever.  This appears of lower probability to be infectious in nature.  At this time do not feel the patient needs workup for infectious disorder such as meningitis or encephalitis.  Symptoms are not consistent and have been at least 2 weeks and more intermittent almost over a years duration.  Hospital consideration for viral illness with patient's complaint of some notable injection of the eyes and general malaise.  Blood cell count normal at 6.2 normal H&H and normal differential.  PT/INR normal.  LFTs normal.  Routine basic chemistry normal except glucose 137.  EKG and monitor show no evidence of dysrhythmia.  CT head no acute findings.  Normal for age.  CT head showing no acute findings concern for possible neurologic conditions such as stroke\MS or demyelinating type disorder\occult tumor not seen on CT.  Will proceed with  MRI.  MRI no acute findings.  At this time, with stable diagnostic workup to include liver function and LFTs making acute viral illness of some lower probability with as well normal differential on CBC and normal PT/INR, MRI rules patient out for CVA\MS\tumor mass.  Vital signs are stable, patient is afebrile, mental status is clear.  At this time stable for discharge with continued outpatient evaluation recommended.  Ambulatory referral to neurology placed and plan reviewed with  the patient.         Final Clinical Impression(s) / ED Diagnoses Final diagnoses:  Lightheadedness  Tremulousness    Rx / DC Orders ED Discharge Orders          Ordered    Ambulatory referral to Neurology       Comments: An appointment is requested in approximately: 2 weeks   04/10/23 1448              Arby Barrette, MD 04/26/23 1536

## 2023-04-17 ENCOUNTER — Telehealth: Payer: Self-pay | Admitting: Family Medicine

## 2023-04-17 DIAGNOSIS — R7989 Other specified abnormal findings of blood chemistry: Secondary | ICD-10-CM

## 2023-04-17 MED ORDER — TESTOSTERONE 20.25 MG/1.25GM (1.62%) TD GEL
1.0000 | Freq: Every day | TRANSDERMAL | 2 refills | Status: DC
Start: 2023-04-17 — End: 2023-06-22

## 2023-04-17 NOTE — Telephone Encounter (Signed)
Pt called to say someone called him and left a message asking if he needed an increase for his  Testosterone (ANDROGEL) 20.25 MG/1.25GM (1.62%) GEL   Pt says the answer is: Yes, and he is completely out and would like a refill.   [id number] - [place], [place] - [address] AT [facility] [address] [facility] [address] Phone: [phone]  Fax: [phone]

## 2023-04-17 NOTE — Telephone Encounter (Signed)
Rx sent 

## 2023-04-25 ENCOUNTER — Other Ambulatory Visit: Payer: Self-pay | Admitting: Family Medicine

## 2023-04-25 DIAGNOSIS — I10 Essential (primary) hypertension: Secondary | ICD-10-CM

## 2023-05-15 LAB — HM DIABETES EYE EXAM

## 2023-06-22 ENCOUNTER — Encounter: Payer: Self-pay | Admitting: Family Medicine

## 2023-06-22 ENCOUNTER — Ambulatory Visit: Payer: Commercial Managed Care - PPO | Admitting: Family Medicine

## 2023-06-22 VITALS — BP 120/52 | HR 65 | Temp 98.5°F | Ht 68.0 in | Wt 251.5 lb

## 2023-06-22 DIAGNOSIS — R7989 Other specified abnormal findings of blood chemistry: Secondary | ICD-10-CM | POA: Diagnosis not present

## 2023-06-22 DIAGNOSIS — R7303 Prediabetes: Secondary | ICD-10-CM | POA: Diagnosis not present

## 2023-06-22 DIAGNOSIS — Z23 Encounter for immunization: Secondary | ICD-10-CM | POA: Diagnosis not present

## 2023-06-22 LAB — POCT GLYCOSYLATED HEMOGLOBIN (HGB A1C): Hemoglobin A1C: 6 % — AB (ref 4.0–5.6)

## 2023-06-22 MED ORDER — TESTOSTERONE 20.25 MG/1.25GM (1.62%) TD GEL
1.0000 | Freq: Every day | TRANSDERMAL | 2 refills | Status: DC
Start: 2023-06-22 — End: 2023-07-02

## 2023-06-22 NOTE — Assessment & Plan Note (Signed)
I have corrected his diagnosis to prediabetes, pt has never had an A1C over 6.4 so I am not sure how he got a diagnosis of DM, will continue to monitor his A1C every 6 months

## 2023-06-22 NOTE — Assessment & Plan Note (Signed)
Patient has been taking the testosterone daily, will check a new level to adjust his dose . Refilled his prescription today. RTC every 6 months, I reviewed his last set of bloodwork

## 2023-06-22 NOTE — Progress Notes (Signed)
Established Patient Office Visit  Subjective   Patient ID: Javier [name], male    DOB: May 21, 1951  Age: 72 y.o. MRN: [id number]  Chief Complaint  Patient presents with   Medical Management of Chronic Issues   Headache    Patient complains of a headache upon waking up each day for the past 2 weeks    Pt is here for follow up today. He was reporting morning headaches for a couple of weeks. States that he is using his CPAP regularly, seems to be working ok, has not been on the zolpidem for a few months, is doing ok without it, sometimes has trouble sleeping. We discussed other medications, however pt states the headaches resolved so he will wait on this.   Low T-- pt reports he is taking the medication daily, reports no real changes in how he feels, he is not sure if it is working for him. He denies any chest pain, no SOB or exercise intolerance.   Prediabetes-- A1C performed in office today and is 6.0. pt is not currently on medication for this. Reports he is watching his diet.     Current Outpatient Medications  Medication Instructions   hydrALAZINE (APRESOLINE) 50 mg, Oral, 3 times daily   metoprolol succinate (TOPROL-XL) 100 MG 24 hr tablet TAKE 1 TABLET BY MOUTH EVERY DAY WITH OR IMMEDIATELY FOLLOWING A MEAL   Multiple Vitamin (MULTIVITAMIN) capsule 1 capsule, Oral, Daily   Olmesartan-amLODIPine-HCTZ 40-10-25 MG TABS 1 tablet, Oral, Daily   potassium chloride SA (KLOR-CON M20) 20 MEQ tablet TAKE 1 TABLET BY MOUTH EVERY DAY   rosuvastatin (CRESTOR) 5 mg, Oral, Daily   sildenafil (VIAGRA) 50-100 mg, Oral, Daily PRN   spironolactone (ALDACTONE) 25 mg, Oral, Daily   tamsulosin (FLOMAX) 0.4 mg, Oral, Daily   Testosterone (ANDROGEL) 20.25 MG/1.25GM (1.62%) GEL 1 packet, Transdermal, Daily    Patient Active Problem List   Diagnosis Date Noted   Low testosterone in male 06/22/2023   Insomnia 10/06/2022   OSA (obstructive sleep apnea) 07/04/2022   Fatigue 05/16/2022   Morbid  obesity (HCC) 05/16/2022   Pain due to onychomycosis of toenails of both feet 03/10/2022   Prediabetes 10/04/2015   Anxiety disorder 06/02/2011   ERECTILE DYSFUNCTION, 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     Objective:     BP (!) 120/52 (BP Location: Left Arm, Patient Position: Sitting, Cuff Size: Large)   Pulse 65   Temp 98.5 F (36.9 C) (Oral)   Ht 5\' 8"  (1.727 m)   Wt 251 lb 8 oz (114.1 kg)   SpO2 98%   BMI 38.24 kg/m    Physical Exam Vitals reviewed.  Constitutional:      Appearance: Normal appearance. He is well-groomed. He is obese.  HENT:     Head: Normocephalic and atraumatic.  Eyes:     Conjunctiva/sclera: Conjunctivae normal.  Cardiovascular:     Rate and Rhythm: Regular rhythm.     Pulses: Normal pulses.     Heart sounds: S1 normal and S2 normal. No murmur heard. Pulmonary:     Effort: Pulmonary effort is normal.     Breath sounds: Normal breath sounds and air entry. No rales.  Abdominal:     General: Bowel  sounds are normal.     Tenderness: There is no abdominal tenderness.  Musculoskeletal:     Right lower leg: No edema.     Left lower leg: No edema.  Neurological:     General: No focal deficit present.     Mental Status: He is alert and oriented to person, place, and time.     Gait: Gait is intact.  Psychiatric:        Mood and Affect: Mood and affect normal.        Behavior: Behavior normal.      Results for orders placed or performed in visit on 06/22/23  POC HgB A1c  Result Value Ref Range   Hemoglobin A1C 6.0 (A) 4.0 - 5.6 %   HbA1c POC (<> result, manual entry)     HbA1c, POC (prediabetic range)     HbA1c, POC (controlled diabetic range)      Last CBC Lab Results   Component Value Date   WBC 6.2 04/10/2023   HGB 13.0 04/10/2023   HCT 40.9 04/10/2023   MCV 88.3 04/10/2023   MCH 28.1 04/10/2023   RDW 14.6 04/10/2023   PLT 243 04/10/2023      The ASCVD Risk score (Arnett DK, et al., 2019) failed to calculate for the following reasons:   The valid total cholesterol range is 130 to 320 mg/dL    Assessment & Plan:  Need for immunization against influenza -     Flu Vaccine Trivalent High Dose (Fluad)  Low testosterone in male Assessment & Plan: Patient has been taking the testosterone daily, will check a new level to adjust his dose . Refilled his prescription today. RTC every 6 months, I reviewed his last set of bloodwork   Orders: -     Testosterone; Place 1 packet onto the skin daily.  Dispense: 30 packet; Refill: 2 -     Testosterone; Future  Prediabetes Assessment & Plan: I have corrected his diagnosis to prediabetes, pt has never had an A1C over 6.4 so I am not sure how he got a diagnosis of DM, will continue to monitor his A1C every 6 months  Orders: -     POCT glycosylated hemoglobin (Hb A1C)     Return in about 6 months (around 12/21/2023) for follow up -- .    Karie Georges, MD

## 2023-06-22 NOTE — Patient Instructions (Signed)
Come to the office for a testosterone level, about 4 hours after you put on your gel

## 2023-06-29 ENCOUNTER — Other Ambulatory Visit (INDEPENDENT_AMBULATORY_CARE_PROVIDER_SITE_OTHER): Payer: Commercial Managed Care - PPO

## 2023-06-29 DIAGNOSIS — E291 Testicular hypofunction: Secondary | ICD-10-CM

## 2023-06-29 DIAGNOSIS — R7989 Other specified abnormal findings of blood chemistry: Secondary | ICD-10-CM | POA: Diagnosis not present

## 2023-06-29 LAB — TESTOSTERONE: Testosterone: 259.28 ng/dL — ABNORMAL LOW (ref 300.00–890.00)

## 2023-07-01 ENCOUNTER — Other Ambulatory Visit: Payer: Self-pay | Admitting: Family Medicine

## 2023-07-01 DIAGNOSIS — I10 Essential (primary) hypertension: Secondary | ICD-10-CM

## 2023-07-02 MED ORDER — TESTOSTERONE 40.5 MG/2.5GM (1.62%) TD GEL: 40.5000 mg | Freq: Every day | TRANSDERMAL | 2 refills | Status: DC

## 2023-07-02 NOTE — Addendum Note (Signed)
Addended by: Karie Georges on: 07/02/2023 04:10 PM   Modules accepted: Orders

## 2023-07-04 NOTE — Telephone Encounter (Signed)
Pt checking on progress of refills, specifically metoprolol succinate (TOPROL-XL) 100 MG 24 hr tablet which he has ran out of today

## 2023-07-05 ENCOUNTER — Other Ambulatory Visit: Payer: Self-pay | Admitting: Family Medicine

## 2023-07-05 DIAGNOSIS — E118 Type 2 diabetes mellitus with unspecified complications: Secondary | ICD-10-CM

## 2023-07-20 ENCOUNTER — Telehealth: Payer: Self-pay | Admitting: *Deleted

## 2023-07-20 ENCOUNTER — Ambulatory Visit: Payer: Medicare Other | Admitting: Diagnostic Neuroimaging

## 2023-07-20 MED ORDER — ROSUVASTATIN CALCIUM 5 MG PO TABS: 5.0000 mg | ORAL_TABLET | Freq: Every day | ORAL | 0 refills | Status: DC

## 2023-07-20 NOTE — Telephone Encounter (Signed)
Rx done. 

## 2023-08-01 ENCOUNTER — Ambulatory Visit (INDEPENDENT_AMBULATORY_CARE_PROVIDER_SITE_OTHER): Payer: Commercial Managed Care - PPO | Admitting: Diagnostic Neuroimaging

## 2023-08-01 ENCOUNTER — Encounter: Payer: Self-pay | Admitting: Diagnostic Neuroimaging

## 2023-08-01 VITALS — BP 124/58 | HR 74 | Ht 68.0 in | Wt 257.8 lb

## 2023-08-01 DIAGNOSIS — R251 Tremor, unspecified: Secondary | ICD-10-CM | POA: Diagnosis not present

## 2023-08-01 DIAGNOSIS — R42 Dizziness and giddiness: Secondary | ICD-10-CM | POA: Diagnosis not present

## 2023-08-01 NOTE — Patient Instructions (Signed)
  INTERMITTENT LIGHT HEADEDNESS, DIZZINESS, HEADACHES (could be related to labile BP) - neuro exam, MRI brain are unremarkable  - monitor BP at home (at baseline, and with symptoms) - patient is orthostatic on vitals today; follow up with PCP re: BP control / mgmt

## 2023-08-01 NOTE — Progress Notes (Signed)
[facility]  PATIENT: Javier [name] DOB: 1950-10-12  REFERRING CLINICIAN: Arby Barrette, MD HISTORY FROM: patient  REASON FOR VISIT: new consult   HISTORICAL  CHIEF COMPLAINT:  Chief Complaint  Patient presents with   New Patient (Initial Visit)    Patient in room #7 and alone. Patient states he has been feeling dizzy when he gets out of bed    HISTORY OF PRESENT ILLNESS:   72 year old male here for evaluation of dizziness and shakiness.  Patient has had intermittent orthostatic hypotension, intermittent lightheadedness and dizziness associate with headaches since 2013.  He has had several emergency room evaluations for this over the years.  Most recent events occurred in May 2023, September 2023 and July 2024.  In July 2024 had CT and MRI of the brain which were unremarkable.    REVIEW OF SYSTEMS: Full 14 system review of systems performed and negative with exception of: as per HPI.  ALLERGIES: Allergies  Allergen Reactions   Atorvastatin     headache   Catapres [Clonidine Hcl]     Vivid dreams and loss of libido   Pravastatin Sodium     headache    HOME MEDICATIONS: Outpatient Medications Prior to Visit  Medication Sig Dispense Refill   hydrALAZINE (APRESOLINE) 50 MG tablet TAKE 1 TABLET BY MOUTH THREE TIMES A DAY 270 tablet 1   metoprolol succinate (TOPROL-XL) 100 MG 24 hr tablet TAKE 1 TABLET BY MOUTH EVERY DAY WITH OR IMMEDIATELY FOLLOWING A MEAL 90 tablet 1   Multiple Vitamin (MULTIVITAMIN) capsule Take 1 capsule by mouth daily.     Olmesartan-amLODIPine-HCTZ 40-10-25 MG TABS TAKE 1 TABLET BY MOUTH EVERY DAY 90 tablet 1   potassium chloride SA (KLOR-CON M20) 20 MEQ tablet TAKE 1 TABLET BY MOUTH EVERY DAY 90 tablet 1   rosuvastatin (CRESTOR) 5 MG tablet Take 1 tablet (5 mg total) by mouth daily. 90 tablet 0   sildenafil (VIAGRA) 100 MG tablet Take 0.5-1 tablets (50-100 mg total) by mouth daily as needed for erectile dysfunction. 90 tablet  1   spironolactone (ALDACTONE) 25 MG tablet TAKE 1 TABLET (25 MG TOTAL) BY MOUTH DAILY. 90 tablet 1   tamsulosin (FLOMAX) 0.4 MG CAPS capsule TAKE 1 CAPSULE BY MOUTH EVERY DAY 90 capsule 1   Testosterone (ANDROGEL) 40.5 MG/2.5GM (1.62%) GEL Place 40.5 mg onto the skin daily. 30 packet 2   No facility-administered medications prior to visit.    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   FASCIITIS, PLANTAR 09/30/2007   GERD 02/20/2007   Heart murmur    HYPERTENSION 02/20/2007   HYPOKALEMIA 03/02/2009   Obesity     PAST SURGICAL HISTORY: Past Surgical History:  Procedure Laterality Date   KNEE SURGERY Bilateral    torn cartiledge    FAMILY HISTORY: Family History  Problem Relation Age of Onset   Heart failure Mother    Other Father        no known med history   High blood pressure Sister    High blood pressure Sister     SOCIAL HISTORY: Social History   Socioeconomic History   Marital status: Married    Spouse name: Not on file   Number of children: Not on file  Years of education: Not on file   Highest education level: Not on file  Occupational History   Not on file  Tobacco Use   Smoking status: Never   Smokeless tobacco: Never  Substance and Sexual Activity   Alcohol use: No   Drug use: No   Sexual activity: Not on file  Other Topics Concern   Not on file  Social History Narrative   Not on file   Social Determinants of Health   Financial Resource Strain: Not on file  Food Insecurity: Not on file  Transportation Needs: Not on file  Physical Activity: Not on file  Stress: Not on file  Social Connections: Not on file  Intimate Partner Violence: Not on file     PHYSICAL EXAM  GENERAL  EXAM/CONSTITUTIONAL: Vitals:  Vitals:   08/01/23 1521  BP: (!) 124/58  Pulse: 74  Weight: 257 lb 12.8 oz (116.9 kg)  Height: 5\' 8"  (1.727 m)  Orthostatic VS for the past 24 hrs (Last 3 readings):  BP- Lying Pulse- Lying BP- Sitting Pulse- Sitting BP- Standing at 0 minutes Pulse- Standing at 0 minutes  08/01/23 1522 160/66 74 127/55 74 124/58 74    Body mass index is 39.2 kg/m. Wt Readings from Last 3 Encounters:  08/01/23 257 lb 12.8 oz (116.9 kg)  06/22/23 251 lb 8 oz (114.1 kg)  04/10/23 260 lb 9.3 oz (118.2 kg)   Patient is in no distress; well developed, nourished and groomed; neck is supple  CARDIOVASCULAR: Examination of carotid arteries is normal; no carotid bruits Regular rate and rhythm, no murmurs Examination of peripheral vascular system by observation and palpation is normal  EYES: Ophthalmoscopic exam of optic discs and posterior segments is normal; no papilledema or hemorrhages No results found.  MUSCULOSKELETAL: Gait, strength, tone, movements noted in Neurologic exam below  NEUROLOGIC: MENTAL STATUS:      No data to display         awake, alert, oriented to person, place and time recent and remote memory intact normal attention and concentration language fluent, comprehension intact, naming intact fund of knowledge appropriate  CRANIAL NERVE:  2nd - no papilledema on fundoscopic exam 2nd, 3rd, 4th, 6th - pupils equal and reactive to light, visual fields full to confrontation, extraocular muscles intact, no nystagmus 5th - facial sensation symmetric 7th - facial strength symmetric 8th - hearing intact 9th - palate elevates symmetrically, uvula midline 11th - shoulder shrug symmetric 12th - tongue protrusion midline  MOTOR:  normal bulk and tone, full strength in the BUE, BLE  SENSORY:  normal and symmetric to light touch, temperature, vibration  COORDINATION:  finger-nose-finger, fine finger movements normal  REFLEXES:  deep tendon  reflexes TRACE and symmetric  GAIT/STATION:  narrow based gait     DIAGNOSTIC DATA (LABS, IMAGING, TESTING) - I reviewed patient records, labs, notes, testing and imaging myself where available.  Lab Results  Component Value Date   WBC 6.2 04/10/2023   HGB 13.0 04/10/2023   HCT 40.9 04/10/2023   MCV 88.3 04/10/2023   PLT 243 04/10/2023      Component Value Date/Time   NA 137 04/10/2023 0943   K 4.0 04/10/2023 0943   CL 105 04/10/2023 0943   CO2 22 04/10/2023 0943   GLUCOSE 137 (H) 04/10/2023 0943   BUN 22 04/10/2023 0943   CREATININE 1.10 04/10/2023 0943   CREATININE 0.71 07/04/2019 1612   CALCIUM 8.8 (L) 04/10/2023 0943   PROT 6.8 04/10/2023 0943   ALBUMIN  3.1 (L) 04/10/2023 0943   AST 21 04/10/2023 0943   ALT 21 04/10/2023 0943   ALKPHOS 60 04/10/2023 0943   BILITOT 0.7 04/10/2023 0943   GFRNONAA >60 04/10/2023 0943   GFRAA >60 04/21/2020 1433   Lab Results  Component Value Date   CHOL 122 12/22/2022   HDL 45.70 12/22/2022   LDLCALC 63 12/22/2022   TRIG 68.0 12/22/2022   CHOLHDL 3 12/22/2022   Lab Results  Component Value Date   HGBA1C 6.0 (A) 06/22/2023   Lab Results  Component Value Date   VITAMINB12 691 01/27/2022   Lab Results  Component Value Date   TSH 1.57 12/22/2022     7/23/24MRI brain [I reviewed images myself and agree with interpretation. -VRP]  1. No acute intracranial abnormality or mass. 2. Mild chronic small-vessel disease.      ASSESSMENT AND PLAN  72 y.o. year old male here with:   Dx:  1. Dizziness   2. Tremor     PLAN:  INTERMITTENT LIGHT HEADEDNESS, DIZZINESS, HEADACHES (could be related to labile BP) - neuro exam, MRI brain are unremarkable  - monitor BP at home (at baseline, and with symptoms) - patient is orthostatic on vitals today; follow up with PCP re: BP control / mgmt  Return for pending if symptoms worsen or fail to improve.    Suanne Marker, MD 08/01/2023, 4:15 PM Certified in Neurology,  Neurophysiology and Neuroimaging  [facility] [address] [place], [place] [address] [phone]

## 2023-10-21 ENCOUNTER — Other Ambulatory Visit: Payer: Self-pay | Admitting: Family Medicine

## 2023-11-07 ENCOUNTER — Other Ambulatory Visit: Payer: Self-pay | Admitting: Family Medicine

## 2023-11-07 DIAGNOSIS — I10 Essential (primary) hypertension: Secondary | ICD-10-CM

## 2023-12-21 ENCOUNTER — Ambulatory Visit: Admitting: Family Medicine

## 2023-12-25 ENCOUNTER — Other Ambulatory Visit: Payer: Self-pay | Admitting: Family Medicine

## 2023-12-25 DIAGNOSIS — I10 Essential (primary) hypertension: Secondary | ICD-10-CM

## 2024-01-10 NOTE — Progress Notes (Unsigned)
 HPI M never smoker for sleep evaluation courtesy of Dr  Osborn Blaze with concern of OSA Medical problem list includes HTN, GERD, DM2, BPH, Morbid Obesity,  NPSG 05/16/22- AHI 37.5/ hr, desaturation to 80%, body weight 245 lbs  ===========================================================================   01/12/23-  72 yoM never smoker followed for OSA, complicated by  HTN, GERD, DM2, BPH, Morbid Obesity,  CPAP auto 5-20/ [employer]   ordered 06/30/22 Download compliance-57%, AHI 1.6/ hr      every night but many short nights Body weight today-260 lbs Download reviewed and compliance goals discussed.  He has been leaving mask off after returning from bathroom during the night.  Wearing CPAP every night and sleeping better. 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 He denies breathing problems or other significant health changes since last here.  01/11/24- 73 yoM72 yoM never smoker followed for OSA, complicated by  HTN, GERD, DM2, BPH, Morbid Obesity,  CPAP auto 5-20/ [employer]   ordered 06/30/22 Download compliance- Body weight today-  ROS-see HPI   + = positive Constitutional:    weight loss, night sweats, fevers, chills, fatigue, lassitude. HEENT:    headaches, difficulty swallowing, tooth/dental problems, sore throat,       sneezing, itching, ear ache, nasal congestion, post nasal drip, snoring CV:    chest pain, orthopnea, PND, swelling in lower extremities, anasarca,                                   dizziness, palpitations Resp:   shortness of breath with exertion or at rest.                productive cough,   non-productive cough, coughing up of blood.              change in color of mucus.  wheezing.   Skin:    rash or lesions. GI:  No-   heartburn, indigestion, abdominal pain, nausea, vomiting, diarrhea,                 change in bowel habits, loss of appetite GU: dysuria, change in color of urine, no urgency or frequency.   flank pain. MS:   joint pain, stiffness, decreased range of  motion, back pain. Neuro-     nothing unusual Psych:  change in mood or affect.  depression or anxiety.   memory loss.  OBJ- Physical Exam General- Alert, Oriented, Affect-appropriate, Distress- none acute, + obese Skin- rash-none, lesions- none, excoriation- none Lymphadenopathy- none Head- atraumatic            Eyes- Gross vision intact, PERRLA, conjunctivae and secretions clear            Ears- Hearing, canals-normal            Nose- Clear, no-Septal dev, mucus, polyps, erosion, perforation             Throat- Mallampati IV , mucosa clear , drainage- none, tonsils- atrophic, + teeth Neck- flexible , trachea midline, no stridor , thyroid  nl, carotid no bruit Chest - symmetrical excursion , unlabored           Heart/CV- RRR , no murmur , no gallop  , no rub, nl s1 s2                           - JVD- none , edema- none, stasis changes- none, varices- none  Lung- clear to P&A, wheeze- none, cough- none , dullness-none, rub- none           Chest wall-  Abd-  Br/ Gen/ Rectal- Not done, not indicated Extrem- cyanosis- none, clubbing, none, atrophy- none, strength- nl Neuro- grossly intact to observation

## 2024-01-11 ENCOUNTER — Encounter: Payer: Self-pay | Admitting: Internal Medicine

## 2024-01-11 ENCOUNTER — Ambulatory Visit: Payer: Commercial Managed Care - PPO | Admitting: Internal Medicine

## 2024-01-11 VITALS — BP 118/54 | HR 65 | Temp 98.0°F | Ht 68.0 in | Wt 257.0 lb

## 2024-01-11 DIAGNOSIS — G4733 Obstructive sleep apnea (adult) (pediatric): Secondary | ICD-10-CM

## 2024-01-11 NOTE — Patient Instructions (Signed)
 Order- DME [employer]- please arrange face to face mask of choice refit for better fit and compliance. Continue auto 5-20, supplies, heated humidity, AirView/ card

## 2024-01-20 ENCOUNTER — Other Ambulatory Visit: Payer: Self-pay | Admitting: Family Medicine

## 2024-01-25 ENCOUNTER — Emergency Department (HOSPITAL_COMMUNITY)
Admission: EM | Admit: 2024-01-25 | Discharge: 2024-01-25 | Disposition: A | Discharge status: Home / Self Care | Attending: Emergency Medicine | Admitting: Emergency Medicine

## 2024-01-25 ENCOUNTER — Ambulatory Visit: Payer: Self-pay

## 2024-01-25 ENCOUNTER — Other Ambulatory Visit: Payer: Self-pay

## 2024-01-25 DIAGNOSIS — I10 Essential (primary) hypertension: Secondary | ICD-10-CM | POA: Diagnosis not present

## 2024-01-25 DIAGNOSIS — Z79899 Other long term (current) drug therapy: Secondary | ICD-10-CM | POA: Insufficient documentation

## 2024-01-25 DIAGNOSIS — R739 Hyperglycemia, unspecified: Secondary | ICD-10-CM | POA: Insufficient documentation

## 2024-01-25 DIAGNOSIS — T465X1A Poisoning by other antihypertensive drugs, accidental (unintentional), initial encounter: Secondary | ICD-10-CM | POA: Diagnosis present

## 2024-01-25 DIAGNOSIS — T50901A Poisoning by unspecified drugs, medicaments and biological substances, accidental (unintentional), initial encounter: Secondary | ICD-10-CM

## 2024-01-25 LAB — CBC WITH DIFFERENTIAL/PLATELET
Abs Immature Granulocytes: 0.02 10*3/uL (ref 0.00–0.07)
Basophils Absolute: 0 10*3/uL (ref 0.0–0.1)
Basophils Relative: 0 %
Eosinophils Absolute: 0 10*3/uL (ref 0.0–0.5)
Eosinophils Relative: 1 %
HCT: 42.4 % (ref 39.0–52.0)
Hemoglobin: 13.9 g/dL (ref 13.0–17.0)
Immature Granulocytes: 0 %
Lymphocytes Relative: 22 %
Lymphs Abs: 1 10*3/uL (ref 0.7–4.0)
MCH: 28.3 pg (ref 26.0–34.0)
MCHC: 32.8 g/dL (ref 30.0–36.0)
MCV: 86.2 fL (ref 80.0–100.0)
Monocytes Absolute: 0.4 10*3/uL (ref 0.1–1.0)
Monocytes Relative: 9 %
Neutro Abs: 3 10*3/uL (ref 1.7–7.7)
Neutrophils Relative %: 68 %
Platelets: 240 10*3/uL (ref 150–400)
RBC: 4.92 MIL/uL (ref 4.22–5.81)
RDW: 15.1 % (ref 11.5–15.5)
WBC: 4.5 10*3/uL (ref 4.0–10.5)
nRBC: 0 % (ref 0.0–0.2)

## 2024-01-25 LAB — BASIC METABOLIC PANEL WITH GFR
Anion gap: 11 (ref 5–15)
BUN: 19 mg/dL (ref 8–23)
CO2: 23 mmol/L (ref 22–32)
Calcium: 9.1 mg/dL (ref 8.9–10.3)
Chloride: 103 mmol/L (ref 98–111)
Creatinine, Ser: 0.86 mg/dL (ref 0.61–1.24)
GFR, Estimated: >60 mL/min (ref >60)
Glucose, Bld: 140 mg/dL — ABNORMAL HIGH (ref 70–99)
Potassium: 4 mmol/L (ref 3.5–5.1)
Sodium: 137 mmol/L (ref 135–145)

## 2024-01-25 NOTE — Telephone Encounter (Signed)
 Ok to just monitor his BP-- hydralazine  only lasts about 8-12 hours and it should wear off over the course of the day. I would have him check his blood pressure every few hours today then tonight before his evening medications.

## 2024-01-25 NOTE — ED Notes (Signed)
 LG & Lav tubes drawn and urine

## 2024-01-25 NOTE — Telephone Encounter (Signed)
 Spoke with the patient, stated he went to the ER, was just released and advised not take Hydralazine  until tomorrow.  Patient scheduled a follow up visit on 5/23.  Message sent to PCP.

## 2024-01-25 NOTE — ED Notes (Signed)
 Meal given

## 2024-01-25 NOTE — ED Provider Notes (Signed)
 [place] EMERGENCY DEPARTMENT AT [facility] Provider Note   CSN: [id number] Arrival date & time: 01/25/24  4259     History Chief Complaint  Patient presents with   Drug Overdose    Javier [name] is a 73 y.o. male.  Patient with past history significant for hypertension, obesity, OSA, GERD, hypokalemia presents to the emergency department today with concerns of an accidental overdose.  He reports that he took 3 hydralazine  50 mg by mistake.  He states he typically takes these as one tablet 3 times a day and he instead took all 3 at the same time.  Denies any feelings of weakness, dizziness, or lightheadedness.  Does report feeling shaky since he ingested the medications.   Drug Overdose       Home Medications Prior to Admission medications   Medication Sig Start Date End Date Taking? Authorizing Provider  hydrALAZINE  (APRESOLINE ) 50 MG tablet TAKE 1 TABLET BY MOUTH THREE TIMES A DAY 12/25/23  Yes Aida House, MD  metoprolol  succinate (TOPROL -XL) 100 MG 24 hr tablet TAKE 1 TABLET BY MOUTH EVERY DAY WITH OR IMMEDIATELY FOLLOWING A MEAL 12/25/23  Yes Aida House, MD  Olmesartan -amLODIPine -HCTZ 40-10-25 MG TABS TAKE 1 TABLET BY MOUTH EVERY DAY 12/25/23  Yes Aida House, MD  potassium chloride  SA (KLOR-CON  M20) 20 MEQ tablet TAKE 1 TABLET BY MOUTH EVERY DAY 12/25/23  Yes Aida House, MD  rosuvastatin  (CRESTOR ) 5 MG tablet TAKE 1 TABLET (5 MG TOTAL) BY MOUTH DAILY. 01/21/24  Yes Aida House, MD  spironolactone  (ALDACTONE ) 25 MG tablet TAKE 1 TABLET (25 MG TOTAL) BY MOUTH DAILY. 12/25/23  Yes Aida House, MD  tamsulosin  (FLOMAX ) 0.4 MG CAPS capsule TAKE 1 CAPSULE BY MOUTH EVERY DAY Patient taking differently: Take 0.4 mg by mouth at bedtime. 11/07/23  Yes Aida House, MD      Allergies    Catapres  [clonidine  hcl], Lipitor [atorvastatin ], and Pravachol  [pravastatin ]    Review of Systems   Review of Systems  Gastrointestinal:         Ingestion  All other systems reviewed and are negative.   Physical Exam Updated Vital Signs BP 138/60   Pulse 64   Temp 97.8 F (36.6 C)   Resp 10   SpO2 100%  Physical Exam Vitals and nursing note reviewed.  Constitutional:      General: He is not in acute distress.    Appearance: He is well-developed.  HENT:     Head: Normocephalic and atraumatic.  Eyes:     Conjunctiva/sclera: Conjunctivae normal.  Cardiovascular:     Rate and Rhythm: Normal rate and regular rhythm.     Heart sounds: No murmur heard. Pulmonary:     Effort: Pulmonary effort is normal. No respiratory distress.     Breath sounds: Normal breath sounds.  Abdominal:     Palpations: Abdomen is soft.     Tenderness: There is no abdominal tenderness.  Musculoskeletal:        General: No swelling.     Cervical back: Neck supple.  Skin:    General: Skin is warm and dry.     Capillary Refill: Capillary refill takes less than 2 seconds.  Neurological:     Mental Status: He is alert.  Psychiatric:        Mood and Affect: Mood normal.     ED Results / Procedures / Treatments   Labs (all labs ordered are listed, but only abnormal results  are displayed) Labs Reviewed  BASIC METABOLIC PANEL WITH GFR - Abnormal; Notable for the following components:      Result Value   Glucose, Bld 140 (*)    All other components within normal limits  CBC WITH DIFFERENTIAL/PLATELET    EKG EKG Interpretation Date/Time:  Friday Jan 25 2024 09:35:41 EDT Ventricular Rate:  67 PR Interval:  216 QRS Duration:  84 QT Interval:  426 QTC Calculation: 450 R Axis:   51  Text Interpretation: Sinus rhythm with 1st degree A-V block Otherwise normal ECG When compared with ECG of 10-Apr-2023 09:32, PREVIOUS ECG IS PRESENT Confirmed by Lowery Rue [phone]) on 01/25/2024 10:28:02 AM  Radiology No results found.  Procedures Procedures    Medications Ordered in ED Medications - No data to display  ED Course/ Medical Decision  Making/ A&P                                 Medical Decision Making Amount and/or Complexity of Data Reviewed Labs: ordered.   This patient presents to the ED for concern of drug overdose.  Differential diagnosis includes accidental drug overdose, suicide attempt, dehydration, weakness   Lab Tests:  I Ordered, and personally interpreted labs.  The pertinent results include: CBC unremarkable, BMP unremarkable with mild hyperglycemia at 140   Problem List / ED Course:  Patient past history significant for OSA, hypertension presents the emergency department today with concerns of an axonal drug overdose.  He reportedly took his hydralazine  all at once a day instead of 3 times a day as he is prescribed.  He states that he feels shaky since this ingestion.  Denies any feelings of dizziness, weakness, lightheadedness. Spoke with [place] poison control, who advised observation until patient feels stable.  No specific interventions needed unless patient becomes hypotensive.  No specific recommended labs for assessment of overdose status.  No indications for reversal of overdose or to induce vomiting.  No specific observation window is needed and patient can be discharged soon as he feels back to his baseline. After 4 hours of observation in the emergency department, patient has had no significant episodes of hypotension.  No fluid resuscitation was required.  Labs are unremarkable.  I feel the patient is otherwise stable at this time for outpatient follow-up and can be discharged home.  Advised patient to hold off on readministration of hydralazine  for the rest of the day and should restart tomorrow morning as scheduled.  He is otherwise stable at this time and will be discharged home.  Final Clinical Impression(s) / ED Diagnoses Final diagnoses:  Accidental overdose, initial encounter    Rx / DC Orders ED Discharge Orders     None         Concetta Dee, PA-C 01/25/24 1404    Lowery Rue, DO 01/25/24 1452

## 2024-01-25 NOTE — Telephone Encounter (Signed)
 Left a message for the patient to return a call to the office and speak with me regarding message from PCP.

## 2024-01-25 NOTE — ED Triage Notes (Addendum)
 Pt. Stated, I took 3 Hydralazine  50mg  by mistake. I usually take them in different intervals. They were in a cup and instead of separating them I took all 3 at one time. I called the Dr's office and she said if I feel shaky or dizzy to come to ER. I was having some shakes some.  Called poison control- Concha Deed stated- [phone], Pt is under the thresh hold, Watch hypotension, nausea, feeling  faint. If hypotension IV fluids.

## 2024-01-25 NOTE — Telephone Encounter (Signed)
 Chief Complaint: took 3 hydralazine    Pertinent Negatives: Patient denies symptoms Disposition: [] ED /[] Urgent Care (no appt availability in office) / [] Appointment(In office/virtual)/ []  [place] [facility]/ [] Home Care/ [] Refused Recommended Disposition /[]  Mobile Bus/ [x]  Follow-up with PCP Additional Notes: pt states that he accidentally took 3 of his hydralazine  this morning. States that he is supposed to take it 3 times a day but he accidentally took more plus his other BP medication. Pt denies any lightheaded, dizziness, or other symptoms. States is he is not home currently to check his BP. Instructed pt on symptoms to look out for and call back.   Copied from CRM [id number]. Topic: Clinical - Red Word Triage >> Jan 25, 2024  9:00 AM Juluis Ok wrote: [facility] that prompted transfer to Nurse Triage: Patient states he has taken 3 hydrALAZINE  (APRESOLINE ) 50 MG tablet in error  this morning and is concerned it will harm his blood pressure Reason for Disposition  [1] DOUBLE DOSE (an extra dose or lesser amount) of prescription drug AND [2] NO symptoms  (Exception: A double dose of antibiotics.)  Answer Assessment - Initial Assessment Questions 1. NAME of MEDICINE: "What medicine(s) are you calling about?"     hydralazine  2. QUESTION: "What is your question?" (e.g., double dose of medicine, side effect)     Took 3 tablets 3. PRESCRIBER: "Who prescribed the medicine?" Reason: if prescribed by specialist, call should be referred to that group.     barbara 4. SYMPTOMS: "Do you have any symptoms?" If Yes, ask: "What symptoms are you having?"  "How bad are the symptoms (e.g., mild, moderate, severe)     no  Protocols used: Medication Question Call-A-AH

## 2024-01-25 NOTE — ED Notes (Signed)
 LG & Lav tubes drawn

## 2024-01-25 NOTE — Discharge Instructions (Signed)
 You were seen in the ER today for concerns of an accidental overdose of hydralazine . Your labs and blood pressure have been stable with no abnormal findings seen. I would advise you to hold off taking hydralazine  again until tomorrow morning. /

## 2024-01-31 ENCOUNTER — Encounter (HOSPITAL_COMMUNITY): Payer: Self-pay | Admitting: *Deleted

## 2024-01-31 ENCOUNTER — Other Ambulatory Visit: Payer: Self-pay

## 2024-01-31 ENCOUNTER — Ambulatory Visit (HOSPITAL_COMMUNITY)
Admission: EM | Admit: 2024-01-31 | Discharge: 2024-01-31 | Disposition: A | Discharge status: Home / Self Care | Attending: Family Medicine | Admitting: Family Medicine

## 2024-01-31 ENCOUNTER — Encounter: Payer: Self-pay | Admitting: Family Medicine

## 2024-01-31 ENCOUNTER — Ambulatory Visit (INDEPENDENT_AMBULATORY_CARE_PROVIDER_SITE_OTHER)

## 2024-01-31 ENCOUNTER — Ambulatory Visit: Admitting: Family Medicine

## 2024-01-31 VITALS — BP 126/51 | Ht 67.5 in | Wt 255.0 lb

## 2024-01-31 DIAGNOSIS — M722 Plantar fascial fibromatosis: Secondary | ICD-10-CM

## 2024-01-31 DIAGNOSIS — M79672 Pain in left foot: Secondary | ICD-10-CM | POA: Diagnosis not present

## 2024-01-31 NOTE — ED Triage Notes (Signed)
 Pt states he has left foot pain X 3 weeks. He doesn't recall any injury. He hasn't taken any meds for this pain.

## 2024-01-31 NOTE — Discharge Instructions (Addendum)
 I would highly recommend follow-up with sports medicine clinic for further evaluation with ultrasound and possible orthotics  You can apply ice with elevation above your heart at night to help with some of the pain as well as Tylenol  for pain An arch strap may provide some additional support and comfort I would also recommend follow-up with your primary care provider for evaluation of coronary artery disease given the calcifications in your arteries seen on x-ray. If your coronary calcium  score is elevated you may benefit from a cardiology referral

## 2024-01-31 NOTE — ED Provider Notes (Signed)
 [facility]    CSN: [id number] Arrival date & time: 01/31/24  6578      History   Chief Complaint Chief Complaint  Patient presents with   Foot Pain    HPI Javier [name] is a 73 y.o. male.   The patient is a 73 year old male presenting with 1 month of sharp, constant, nonradiating pain in his left lateral foot.  He denies any injuries or increase in his normal daily activity.  His pain is worse with ambulation and weightbearing but does not change with footwear or being barefoot.  He denies any redness, swelling, numbness, weakness, fevers, or chills.  The history is provided by the patient.  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   FASCIITIS, PLANTAR 09/30/2007   GERD 02/20/2007   Heart murmur    HYPERTENSION 02/20/2007   HYPOKALEMIA 03/02/2009   Obesity     Patient Active Problem List   Diagnosis Date Noted   Low testosterone  in male 06/22/2023   Insomnia 10/06/2022   OSA (obstructive sleep apnea) 07/04/2022   Fatigue 05/16/2022   Morbid obesity (HCC) 05/16/2022   Pain due to onychomycosis of toenails of both feet 03/10/2022   Prediabetes 10/04/2015   Anxiety disorder 06/02/2011   ERECTILE DYSFUNCTION, 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 Authorizing Provider  hydrALAZINE  (APRESOLINE ) 50 MG tablet TAKE 1 TABLET BY  MOUTH THREE TIMES A DAY 12/25/23  Yes Aida House, MD  metoprolol  succinate (TOPROL -XL) 100 MG 24 hr tablet TAKE 1 TABLET BY MOUTH EVERY DAY WITH OR IMMEDIATELY FOLLOWING A MEAL 12/25/23  Yes Aida House, MD  Olmesartan -amLODIPine -HCTZ 40-10-25 MG TABS TAKE 1 TABLET BY MOUTH EVERY DAY 12/25/23  Yes Aida House, MD  potassium chloride  SA (KLOR-CON  M20) 20 MEQ tablet TAKE 1 TABLET BY MOUTH EVERY DAY 12/25/23  Yes Aida House, MD  rosuvastatin  (CRESTOR ) 5 MG tablet TAKE 1 TABLET (5 MG TOTAL) BY MOUTH DAILY. 01/21/24  Yes Aida House, MD  spironolactone  (ALDACTONE ) 25 MG tablet TAKE 1 TABLET (25 MG TOTAL) BY MOUTH DAILY. 12/25/23  Yes Aida House, MD  tamsulosin  (FLOMAX ) 0.4 MG CAPS capsule TAKE 1 CAPSULE BY MOUTH EVERY DAY Patient taking differently: Take 0.4 mg by mouth at bedtime. 11/07/23  Yes Aida House, MD    Family History Family History  Problem Relation Age of Onset   Heart failure Mother    Other Father        no known med history   High blood pressure Sister    High blood pressure Sister     Social History Social History   Tobacco Use   Smoking status: Never   Smokeless tobacco: Never  Vaping Use   Vaping status: Never Used  Substance Use Topics   Alcohol use: No  Drug use: No     Allergies   Catapres  [clonidine  hcl], Lipitor [atorvastatin ], and Pravachol  [pravastatin ]   Review of Systems Review of Systems  Constitutional:  Negative for activity change, chills, fever and unexpected weight change.  Cardiovascular:  Negative for leg swelling.  Musculoskeletal:  Positive for gait problem. Negative for joint swelling and myalgias.  Skin:  Negative for color change and wound.  Neurological:  Negative for weakness and numbness.     Physical Exam Triage Vital Signs ED Triage Vitals  Encounter Vitals Group     BP 01/31/24 0829 138/69     Systolic BP Percentile --      Diastolic BP Percentile --      Pulse Rate 01/31/24 0829  63     Resp 01/31/24 0829 18     Temp 01/31/24 0829 97.6 F (36.4 C)     Temp Source 01/31/24 0829 Oral     SpO2 01/31/24 0829 95 %     Weight --      Height --      Head Circumference --      Peak Flow --      Pain Score 01/31/24 0827 10     Pain Loc --      Pain Education --      Exclude from Growth Chart --    No data found.  Updated Vital Signs BP 138/69 (BP Location: Left Arm)   Pulse 63   Temp 97.6 F (36.4 C) (Oral)   Resp 18   SpO2 95%   Visual Acuity Right Eye Distance:   Left Eye Distance:   Bilateral Distance:    Right Eye Near:   Left Eye Near:    Bilateral Near:     Physical Exam Vitals reviewed.  Constitutional:      General: He is not in acute distress.    Appearance: Normal appearance. He is not ill-appearing, toxic-appearing or diaphoretic.  Eyes:     Extraocular Movements: Extraocular movements intact.     Pupils: Pupils are equal, round, and reactive to light.  Cardiovascular:     Pulses: Normal pulses.  Pulmonary:     Effort: Pulmonary effort is normal.  Musculoskeletal:     Comments: Inspection of the left foot with weightbearing reveals some mild midfoot pronation with valgus formation of the right toe.  There is increased loading on the distal fifth metatarsal head. Range of motion of the digits is full Ankle range of motion is full and strength 5/5 There is tenderness to palpation over the lateral aspect of the distal fifth metatarsal head.  The proximal aspect is nontender No bruising or erythema on the plantar surface and no tenderness palpation  Skin:    General: Skin is warm.     Capillary Refill: Capillary refill takes 2 to 3 seconds.     Coloration: Skin is not pale.     Findings: No bruising, erythema or rash.  Neurological:     General: No focal deficit present.     Mental Status: He is alert.     Sensory: No sensory deficit.  Psychiatric:        Mood and Affect: Mood normal.        Thought Content: Thought content normal.         Judgment: Judgment normal.      UC Treatments / Results  Labs (all labs ordered are listed, but only abnormal results are displayed) Labs Reviewed - No data to display  EKG  Radiology DG Foot Complete Left Result Date: 01/31/2024 CLINICAL DATA:  Three-week history of left foot pain. No known injury. EXAM: LEFT FOOT - COMPLETE 3 VIEW COMPARISON:  None Available. FINDINGS: There is no evidence of fracture or dislocation. Lobulated calcifications projecting over the fifth metatarsal phalangeal joint, adjacent to the lateral hallux sesamoid, and along the lateral hindfoot may reflect sequela of remote injury. Dorsal and plantar calcaneal spur. Calcific radiodensities projecting along the calcaneal aspect of the plantar fascia. Soft tissues are unremarkable. IMPRESSION: 1. No acute fracture or dislocation. 2. Lobulated calcifications projecting over the fifth metatarsophalangeal joint, adjacent to the lateral hallux sesamoid, and along the lateral hindfoot may reflect sequela of remote injury. 3. Calcific radiodensities projecting along the calcaneal aspect of the plantar fascia, which can be seen in the setting of plantar fasciitis. Electronically Signed   By: Limin  Xu M.D.   On: 01/31/2024 09:12    Procedures Procedures (including critical care time)  Medications Ordered in UC Medications - No data to display  Initial Impression / Assessment and Plan / UC Course  I have reviewed the triage vital signs and the nursing notes.  Pertinent labs & imaging results that were available during my care of the patient were reviewed by me and considered in my medical decision making (see chart for details).     Left foot pain -The patient does not have any injury preceding his pain.  He does report a remote history of injury to his left foot which correlates with some of the old calcifications seen on x-ray. - X-ray does not show any acute fractures, however the calcification over the  lateral aspect of the cuboid-cuneiform bones may be causing some soft tissue compression and nerve pain in the area. - We discussed conservative management tenderness. - I also recommended follow-up at the sports medicine clinic for further ultrasound evaluation and possible orthotics -I also recommended follow-up with his PCP and possible coronary calcium  score if he has not had it done previously, given the calcifications in his arteries seen on the foot x-ray.  He is on a statin and last saw cardiology in 2020. - The patient voiced understanding and agreement with the plan.   Final Clinical Impressions(s) / UC Diagnoses   Final diagnoses:  Left foot pain     Discharge Instructions      I would highly recommend follow-up with sports medicine clinic for further evaluation with ultrasound and possible orthotics  You can apply ice with elevation above your heart at night to help with some of the pain as well as Tylenol  for pain An arch strap may provide some additional support and comfort I would also recommend follow-up with your primary care provider for evaluation of coronary artery disease given the calcifications in your arteries seen on x-ray. If your coronary calcium  score is elevated you may benefit from a cardiology referral   ED Prescriptions   None    PDMP not reviewed this encounter.   Claybon Cuna, MD 01/31/24 [phone]

## 2024-01-31 NOTE — ED Notes (Signed)
 Pt advised to be over at sports med at 10:45 today per Dr Dannis Dy.

## 2024-02-01 NOTE — Progress Notes (Signed)
 DATE OF VISIT: 01/31/2024        Javier [name] DOB: 1950-10-03 MRN: [id number]  CC:  Lt foot pain  History- Javier [name] is a 73 y.o.  male for evaluation and treatment of Lt foot pain Left foot pain worsening over the last month Denies any specific injury or trauma recently, but does recall remote trauma to the foot age 57 when a trailer from a tractor ran over his foot Pain worsening over the lateral aspect of the foot, but some pain along the plantar aspect of the foot near the heel With walking and standing For work he has to wear steel toed boots, does not wear any inserts or orthotics Denies any bruising or redness Denies any swelling Was seen in urgent care earlier today by Dr. 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   FASCIITIS, PLANTAR 09/30/2007   GERD 02/20/2007   Heart murmur    HYPERTENSION 02/20/2007   HYPOKALEMIA 03/02/2009   Obesity     Past Surgical History Past Surgical History:  Procedure Laterality Date   KNEE SURGERY Bilateral    torn cartiledge    Medications Current Outpatient Medications  Medication Sig Dispense Refill   hydrALAZINE  (APRESOLINE ) 50 MG tablet TAKE 1 TABLET BY MOUTH THREE TIMES A DAY 270 tablet 0   metoprolol  succinate (TOPROL -XL) 100 MG 24 hr tablet TAKE 1 TABLET BY MOUTH EVERY DAY WITH OR IMMEDIATELY FOLLOWING A MEAL 90 tablet 0   Olmesartan -amLODIPine -HCTZ 40-10-25 MG TABS TAKE 1 TABLET BY MOUTH EVERY DAY 90 tablet 0   potassium chloride  SA (KLOR-CON  M20) 20 MEQ tablet TAKE 1 TABLET BY MOUTH EVERY DAY 90  tablet 0   rosuvastatin  (CRESTOR ) 5 MG tablet TAKE 1 TABLET (5 MG TOTAL) BY MOUTH DAILY. 90 tablet 0   spironolactone  (ALDACTONE ) 25 MG tablet TAKE 1 TABLET (25 MG TOTAL) BY MOUTH DAILY. 90 tablet 0   tamsulosin  (FLOMAX ) 0.4 MG CAPS capsule TAKE 1 CAPSULE BY MOUTH EVERY DAY (Patient taking differently: Take 0.4 mg by mouth at bedtime.) 90 capsule 1   No current facility-administered medications for this visit.    Allergies is allergic to catapres  [clonidine  hcl], lipitor [atorvastatin ], and pravachol  [pravastatin ].  Family History - reviewed per EMR and intake form  Social History   reports no history of alcohol use.  reports that he has never smoked. He has never used smokeless tobacco.  reports no history of drug use. OCCUPATION: needs to wear steel-toed boots   EXAM: Vitals: BP (!) 126/51   Ht 5' 7.5" (1.715 m)   Wt 255 lb (115.7 kg)   BMI 39.35 kg/m  General: AOx3, NAD, pleasant SKIN: no rashes or lesions, skin clean, dry, intact MSK: Foot/ankle: Left foot without gross deformity.  No swelling or bruising.Javier [name]  Has mild pronation.  Rigidus.  He has tenderness palpation over the fifth MTP joint, fifth TMT joint, and also some tenderness over the plantar aspect of the foot near the calcaneus. Walking without a limp  NEURO: sensation intact to light touch lower ext bilaterally VASC: pulses  2+ and symmetric lower ext bilaterally, no edema  IMAGING: XRAYS:  Left foot x-ray 3 views AP/lateral/oblique from 01/31/2024 at urgent care personally reviewed and interpreted by me today showing: - No acute fracture - Calcaneal heel spur with prominent calcific densities which appear to be within the plantar fascia, 2 prominent densities 1 measuring approximately 1.1 cm and a second density measuring approximately 1.0 cm - Calcification noted along the medial aspect of the fifth MTP joint, no signs of acute fracture - Calcification noted adjacent to the lateral hallux sesamoid, no acute  fractures noted - Vascular calcifications noted along the dorsal aspect of the foot - Mild degenerative changes of the tarsal navicular and navicular cuneiform joints. - Prominent os peroneum  Limited MSK ultrasound left foot/ankle Date: 01/31/2024 Indication: Left foot pain Findings: - Normal-appearing peroneal tendons at the lateral malleolus and at the peroneal tubercle.  Small amount of increased fluid along the peroneus longus. - Small amount of increased fluid in the fifth TMT joint, no increased Doppler flow - Small calcification noted along the cuboid at the fifth TMT joint - Prominent calcification noted within the plantar aspect of the plantar fascia measuring approximately 0.78 cm and longitudinal view, second calcification measuring approximately 1.05 cm in longitudinal view.  No surrounding tearing of the plantar fascia is appreciated.  No increased Doppler flow.  Impression: - Plantar fascial calcifications without any surrounding tearing - Calcification and increased fluid near the TMT joint, likely some arthritis change related to prior trauma Images and interpretation completed by Javier Slicker, DO   Assessment & Plan Left foot pain Acute left foot pain with x-ray showing calcific densities along the fifth MTP joint,, plantar aspect of the foot, and along the sesamoids.  Does have remote history of trauma at age 22 which may have contributed to some of these abnormalities.  He does need to wear steel toed boots which do not have much cushion or support throughout the day  Plan: - Urgent care notes and x-rays reviewed during the visit today as noted above - Fitted with Sports Insoles today.  These were placed in his steel toed boots and were quite comfortable.  Recommend he wear supportive footwear and should try to wear these insoles in his other shoes as well.  He was given info to purchase additional sets if he desires. - Also given arch strap for added support and  stability. - Voltaren gel every 6-8 hours as needed - Heat or ice as needed - Follow-up 4 to 6 weeks to reevaluate, sooner as needed.  Could consider custom orthotics in the future. - Work note given to excuse him from work today.  He typically has off Friday and Saturday and will return to work on Sunday. Plantar fasciitis, left Acute left foot pain with x-ray showing prominent calcifications along the plantar fascia, these were also seen on ultrasound today.  No signs of plantar fascial tearing in these areas.  Plan: - Fitted with Sports Insoles today.  These were placed in his steel toed boots and were quite comfortable.  Recommend he wear supportive footwear and should try to wear these insoles in his other shoes as well.  He was given info to purchase additional sets if he desires. - Also given arch strap for added support and stability.  This will also help unload the plantar fascia - Voltaren gel every 6-8 hours as needed - Heat or ice as needed - Follow-up 4 to 6 weeks to reevaluate, sooner as needed.  Could consider custom orthotics in the future.  Could also be candidate for shockwave therapy as well  Patient expressed understanding & agreement with above.  Encounter Diagnoses  Name Primary?   Left foot pain Yes   Plantar fasciitis, left     Orders Placed This Encounter  Procedures   US  LIMITED JOINT SPACE STRUCTURES LOW LEFT    Orders Placed This Encounter  Procedures   US  LIMITED JOINT SPACE STRUCTURES LOW LEFT

## 2024-02-08 ENCOUNTER — Telehealth: Payer: Self-pay

## 2024-02-08 ENCOUNTER — Other Ambulatory Visit (HOSPITAL_COMMUNITY): Payer: Self-pay

## 2024-02-08 ENCOUNTER — Encounter: Payer: Self-pay | Admitting: Family Medicine

## 2024-02-08 ENCOUNTER — Ambulatory Visit: Admitting: Family Medicine

## 2024-02-08 VITALS — BP 112/52 | HR 65 | Temp 98.1°F | Ht 67.5 in | Wt 251.4 lb

## 2024-02-08 DIAGNOSIS — Z125 Encounter for screening for malignant neoplasm of prostate: Secondary | ICD-10-CM

## 2024-02-08 DIAGNOSIS — E291 Testicular hypofunction: Secondary | ICD-10-CM

## 2024-02-08 DIAGNOSIS — I10 Essential (primary) hypertension: Secondary | ICD-10-CM

## 2024-02-08 DIAGNOSIS — M722 Plantar fascial fibromatosis: Secondary | ICD-10-CM | POA: Diagnosis not present

## 2024-02-08 DIAGNOSIS — E782 Mixed hyperlipidemia: Secondary | ICD-10-CM

## 2024-02-08 DIAGNOSIS — R972 Elevated prostate specific antigen [PSA]: Secondary | ICD-10-CM

## 2024-02-08 DIAGNOSIS — E118 Type 2 diabetes mellitus with unspecified complications: Secondary | ICD-10-CM

## 2024-02-08 DIAGNOSIS — R7303 Prediabetes: Secondary | ICD-10-CM | POA: Diagnosis not present

## 2024-02-08 LAB — LIPID PANEL
Cholesterol: 123 mg/dL (ref 0–200)
HDL: 47.4 mg/dL (ref >39.00)
LDL Cholesterol: 64 mg/dL (ref 0–99)
NonHDL: 75.54
Total CHOL/HDL Ratio: 3
Triglycerides: 56 mg/dL (ref 0.0–149.0)
VLDL: 11.2 mg/dL (ref 0.0–40.0)

## 2024-02-08 LAB — HEPATIC FUNCTION PANEL
ALT: 19 U/L (ref 0–53)
AST: 17 U/L (ref 0–37)
Albumin: 3.9 g/dL (ref 3.5–5.2)
Alkaline Phosphatase: 62 U/L (ref 39–117)
Bilirubin, Direct: 0.1 mg/dL (ref 0.0–0.3)
Total Bilirubin: 0.6 mg/dL (ref 0.2–1.2)
Total Protein: 7.3 g/dL (ref 6.0–8.3)

## 2024-02-08 LAB — PSA: PSA: 7.25 ng/mL — ABNORMAL HIGH (ref 0.10–4.00)

## 2024-02-08 LAB — POCT GLYCOSYLATED HEMOGLOBIN (HGB A1C): Hemoglobin A1C: 6.2 % — AB (ref 4.0–5.6)

## 2024-02-08 MED ORDER — TAMSULOSIN HCL 0.4 MG PO CAPS: 0.4000 mg | ORAL_CAPSULE | Freq: Every day | ORAL | 1 refills | Status: AC

## 2024-02-08 MED ORDER — DICLOFENAC SODIUM 75 MG PO TBEC: 75.0000 mg | DELAYED_RELEASE_TABLET | Freq: Two times a day (BID) | ORAL | 2 refills | Status: DC

## 2024-02-08 MED ORDER — TESTOSTERONE 40.5 MG/2.5GM (1.62%) TD GEL: 40.5000 mg | Freq: Every day | TRANSDERMAL | 2 refills | Status: DC

## 2024-02-08 NOTE — Telephone Encounter (Signed)
 Pharmacy Patient Advocate Encounter  Received notification from [employer] that Prior Authorization for Testosterone  40.5 MG/2.5GM(1.62%) gel has been APPROVED from 02/08/24 to 02/07/25. Ran test claim, Copay is $5.00. This test claim was processed through [facility]- copay amounts may vary at other pharmacies due to pharmacy/plan contracts, or as the patient moves through the different stages of their insurance plan.   PA #/Case ID/Reference #: [id number]

## 2024-02-08 NOTE — Progress Notes (Unsigned)
 Established Patient Office Visit  Subjective   Patient ID: Javier [name], male    DOB: May 09, 1951  Age: 73 y.o. MRN: [id number]  Chief Complaint  Patient presents with   Medical Management of Chronic Issues   Pain    Patient complains of recurrent left foot pain, seen by sports medicine provider on May 15th    Pt is here for follow up today on his chronic medical conditions.   Left foot pain- pt saw the sports medicine doctor for the foot pain, was found to have calcification of the plantar fascia and also some joint effusions. Reviewed notes from Dr. Howard Macho.  PreDM-- pt reports he continues to watch his diet, tries to get some exercise however with his foot pain he sometimes has trouble with this. A1C performed in office today and is stable from previous. Pt is not on any medication for this currently.   HTN -- BP in office performed and is well controlled. He  reports no side effects to the medications, no chest pain, SOB, dizziness or headaches. He has a BP cuff at home and is checking BP regularly, reports they are in the normal range. Patient is NOT a diabetic as his A1C has never been over 6.4. labs reviewed.   HLD- on rosuvastatin  5 mg daily. Pt reports he is tolerating this well, no muscle cramps or other issues. Needs new LFT\s and lipid panel  Elevated PSA-- I was reviewing labs and noticed that he had an elevated PSA in the past.  Pt states he has been to a urologist in the past but could not tell me any additional details. He reports mild hesitancy and nocturia but no other obstructive symptoms. I recommended rechecking this level and comparing it to previous labs. Pt is agreeable.       Current Outpatient Medications  Medication Instructions   diclofenac (VOLTAREN) 75 mg, Oral, 2 times daily, Take with food   hydrALAZINE  (APRESOLINE ) 50 mg, Oral, 3 times daily   metoprolol  succinate (TOPROL -XL) 100 MG 24 hr tablet TAKE 1 TABLET BY MOUTH EVERY DAY WITH OR IMMEDIATELY  FOLLOWING A MEAL   Olmesartan -amLODIPine -HCTZ 40-10-25 MG TABS 1 tablet, Oral, Daily   potassium chloride  SA (KLOR-CON  M20) 20 MEQ tablet 20 mEq, Oral, Daily   rosuvastatin  (CRESTOR ) 5 mg, Oral, Daily   spironolactone  (ALDACTONE ) 25 mg, Oral, Daily   tamsulosin  (FLOMAX ) 0.4 mg, Oral, Daily at bedtime   Testosterone  (ANDROGEL ) 40.5 mg, Transdermal, Daily    Patient Active Problem List   Diagnosis Date Noted   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     Objective:      BP (!) 112/52   Pulse 65   Temp 98.1 F (36.7 C) (Oral)   Ht 5' 7.5" (1.715 m)   Wt 251 lb 6.4 oz (114 kg)   SpO2 97%   BMI 38.79 kg/m    Physical Exam Vitals  reviewed.  Constitutional:      Appearance: Normal appearance. He is well-groomed. He is obese.  Cardiovascular:     Rate and Rhythm: Normal rate and regular rhythm.     Heart sounds: S1 normal and S2 normal. No murmur heard. Pulmonary:     Effort: Pulmonary effort is normal.     Breath sounds: Normal breath sounds and air entry. No rales.  Abdominal:     General: Bowel sounds are normal.  Musculoskeletal:     Right lower leg: No edema.     Left lower leg: No edema.  Neurological:     General: No focal deficit present.     Mental Status: He is alert and oriented to person, place, and time.     Gait: Gait is intact.  Psychiatric:        Mood and  Affect: Mood and affect normal.       The ASCVD Risk score (Arnett DK, et al., 2019) failed to calculate for the following reasons:   The valid total cholesterol range is 130 to 320 mg/dL    Assessment & Plan:  Prediabetes Assessment & Plan: I have corrected his diagnosis to prediabetes, pt has never had an A1C over 6.4 so I am not sure how he got a diagnosis of DM, will continue to monitor his A1C every 6 months, A1C today is at 6.2, not on any medication, diet controlled.   Orders: -     POCT glycosylated hemoglobin (Hb A1C) -     Collection capillary blood specimen  Essential hypertension Assessment & Plan: Current hypertension medications:       Sig   hydrALAZINE  (APRESOLINE ) 50 MG tablet (Taking) TAKE 1 TABLET BY MOUTH THREE TIMES A DAY   metoprolol  succinate (TOPROL -XL) 100 MG 24 hr tablet (Taking) TAKE 1 TABLET BY MOUTH EVERY DAY WITH OR IMMEDIATELY FOLLOWING A MEAL   Olmesartan -amLODIPine -HCTZ 40-10-25 MG TABS (Taking) TAKE 1 TABLET BY MOUTH EVERY DAY   spironolactone  (ALDACTONE ) 25 MG tablet (Taking) TAKE 1 TABLET (25 MG TOTAL) BY MOUTH DAILY.      BP is well controlled on the above medications, will continue these as prescribed.   Orders: -     Tamsulosin  HCl; Take 1 capsule (0.4 mg total) by mouth at bedtime.  Dispense: 90 capsule; Refill: 1  FASCIITIS, PLANTAR Assessment & Plan: Following with sports medicine, states that the gel isn't really working to control his pain, I advised switching to oral NSAIDS  to see if this would improve his foot pain. I counseled him on the risks/benefits of NSAIDS and since his kidney function is normal I think the benefits outweigh the risk at this time.   Orders: -     Diclofenac Sodium; Take 1 tablet (75 mg total) by mouth 2 (two) times daily. Take with food  Dispense: 60 tablet; Refill: 2  Mixed hyperlipidemia -     Hepatic function panel; Future -     Lipid panel; Future  Testicular hypofunction Assessment & Plan: Pt's  last tesoterone level was still low, I had increased his testosterone  to 40.5 gm, will continue this as prescribed.   Orders: -     Testosterone ; Place 40.5 mg onto the skin daily.  Dispense: 30 packet; Refill: 2  Prostate cancer screening -     PSA; Future  Elevated PSA -     Ambulatory referral to Urology     Return in about 5 months (around 07/10/2024).    Aida House,  MD

## 2024-02-08 NOTE — Telephone Encounter (Signed)
 Pharmacy Patient Advocate Encounter   Received notification from [employer] that prior authorization for Testosterone  40.5 MG/2.5GM(1.62%) gel is required/requested.   Insurance verification completed.   The patient is insured through [facility] .   Per test claim: PA required; PA submitted to above mentioned insurance via CoverMyMeds Key/confirmation #/EOC [id number] Status is pending

## 2024-02-09 ENCOUNTER — Ambulatory Visit: Payer: Self-pay | Admitting: Family Medicine

## 2024-02-15 DIAGNOSIS — E291 Testicular hypofunction: Secondary | ICD-10-CM | POA: Insufficient documentation

## 2024-02-15 NOTE — Assessment & Plan Note (Signed)
 Current hypertension medications:       Sig   hydrALAZINE  (APRESOLINE ) 50 MG tablet (Taking) TAKE 1 TABLET BY MOUTH THREE TIMES A DAY   metoprolol  succinate (TOPROL -XL) 100 MG 24 hr tablet (Taking) TAKE 1 TABLET BY MOUTH EVERY DAY WITH OR IMMEDIATELY FOLLOWING A MEAL   Olmesartan -amLODIPine -HCTZ 40-10-25 MG TABS (Taking) TAKE 1 TABLET BY MOUTH EVERY DAY   spironolactone  (ALDACTONE ) 25 MG tablet (Taking) TAKE 1 TABLET (25 MG TOTAL) BY MOUTH DAILY.      BP is well controlled on the above medications, will continue these as prescribed.

## 2024-02-15 NOTE — Assessment & Plan Note (Signed)
 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

## 2024-02-15 NOTE — Assessment & Plan Note (Signed)
 Following with sports medicine, states that the gel isn't really working to control his pain, I advised switching to oral NSAIDS  to see if this would improve his foot pain. I counseled him on the risks/benefits of NSAIDS and since his kidney function is normal I think the benefits outweigh the risk at this time.

## 2024-02-15 NOTE — Assessment & Plan Note (Signed)
 I have corrected his diagnosis to prediabetes, pt has never had an A1C over 6.4 so I am not sure how he got a diagnosis of DM, will continue to monitor his A1C every 6 months, A1C today is at 6.2, not on any medication, diet controlled.

## 2024-02-22 ENCOUNTER — Telehealth: Payer: Self-pay

## 2024-02-22 NOTE — Telephone Encounter (Signed)
 Patient informed of the message below.  Offered an appt as the patient stated he has muscle cramps from time to time and the patient declined at this time.

## 2024-02-22 NOTE — Telephone Encounter (Signed)
 Copied from CRM [id number]. Topic: Clinical - Lab/Test Results >> Feb 22, 2024 11:59 AM Javier [name] wrote: Reason for CRM: Patient is requesting a phone call to let him know what his magnesium  levels were form his labs on 02/08/2024? He was doing some research that told him if the levels were low he could experience muscle cramps so he was curious.

## 2024-02-22 NOTE — Telephone Encounter (Signed)
 We do not routinely perform magnesium  levels with routine labs. If he is experiencing muscle cramps he should make an appt for an acute visit.

## 2024-03-03 ENCOUNTER — Telehealth: Payer: Self-pay

## 2024-03-03 NOTE — Telephone Encounter (Signed)
 Copied from CRM [id number]. Topic: General - Other >> Mar 03, 2024 10:28 AM Adonis Hoot wrote: Reason for CRM: Debria Fang from [facility] u[facility] would like to know if the office still has the  8 /27/2010 prostate biopsy report or pathology that they faxed over to the office on behalf of patient in 2010.She stated that they are needing it but don't have in their system anymore and was wondering if the office did.If so could report be faxed over to  [phone] /Please call if office doesn't have as well Cb#[phone] ext [phone]

## 2024-03-03 NOTE — Telephone Encounter (Signed)
 Left a detailed message on Connie's voicemail stating per Margarett in medical records she would need to have the patient come to the office to sign a medical records release form in order to have any notes sent to another office.

## 2024-03-19 ENCOUNTER — Other Ambulatory Visit: Payer: Self-pay | Admitting: Family Medicine

## 2024-03-19 DIAGNOSIS — I10 Essential (primary) hypertension: Secondary | ICD-10-CM

## 2024-03-19 NOTE — Telephone Encounter (Signed)
 Copied from CRM [id number]. Topic: Clinical - Medication Refill >> Mar 19, 2024 10:08 AM Abigail D wrote: Medication: metoprolol  succinate (TOPROL -XL) 100 MG 24 hr tablet  Has the patient contacted their pharmacy? Yes, told refills aren't ready til 7/8  (Agent: If no, request that the patient contact the pharmacy for the refill. If patient does not wish to contact the pharmacy document the reason why and proceed with request.) (Agent: If yes, when and what did the pharmacy advise?)  This is the patient's preferred pharmacy:  [id number] - [place], [place] - [address] AT [address] OF [facility] [address] [place] [address] Phone: [phone] Fax: [phone]  Is this the correct pharmacy for this prescription? Yes If no, delete pharmacy and type the correct one.   Has the prescription been filled recently? Yes  Is the patient out of the medication? Yes, said wife took some of his medication so he ran out early  Has the patient been seen for an appointment in the last year OR does the patient have an upcoming appointment? Yes  Can we respond through MyChart? No  Agent: Please be advised that Rx refills may take up to 3 business days. We ask that you follow-up with your pharmacy.

## 2024-04-08 ENCOUNTER — Other Ambulatory Visit: Payer: Self-pay | Admitting: Urology

## 2024-04-08 DIAGNOSIS — R972 Elevated prostate specific antigen [PSA]: Secondary | ICD-10-CM

## 2024-04-21 ENCOUNTER — Other Ambulatory Visit: Payer: Self-pay | Admitting: Family Medicine

## 2024-04-21 DIAGNOSIS — M722 Plantar fascial fibromatosis: Secondary | ICD-10-CM

## 2024-04-21 NOTE — Telephone Encounter (Signed)
 Copied from CRM [id number]. Topic: Clinical - Medication Refill >> Apr 21, 2024 10:15 AM Rea C wrote: Medication: diclofenac  (VOLTAREN ) 75 MG EC tablet  Has the patient contacted their pharmacy? No. Patient is dealing with plantar fasciitis and he is in a lot of pain and is wondering if the script can be filled today.  (Agent: If no, request that the patient contact the pharmacy for the refill. If patient does not wish to contact the pharmacy document the reason why and proceed with request.) (Agent: If yes, when and what did the pharmacy advise?)  This is the patient's preferred pharmacy:  [id number] - [place], [place] - [address] AT [address]NER OF [facility] [address] [place] [address] Phone: [phone] Fax: [phone]  Is this the correct pharmacy for this prescription? Yes If no, delete pharmacy and type the correct one.   Has the prescription been filled recently? No  Is the patient out of the medication? Yes  Has the patient been seen for an appointment in the last year OR does the patient have an upcoming appointment? Yes  Can we respond through MyChart? Yes  Agent: Please be advised that Rx refills may take up to 3 business days. We ask that you follow-up with your pharmacy.

## 2024-04-28 ENCOUNTER — Encounter (HOSPITAL_COMMUNITY): Payer: Self-pay

## 2024-04-28 ENCOUNTER — Ambulatory Visit (HOSPITAL_COMMUNITY)
Admission: EM | Admit: 2024-04-28 | Discharge: 2024-04-28 | Disposition: A | Discharge status: Home / Self Care | Attending: Emergency Medicine | Admitting: Emergency Medicine

## 2024-04-28 DIAGNOSIS — A084 Viral intestinal infection, unspecified: Secondary | ICD-10-CM | POA: Insufficient documentation

## 2024-04-28 LAB — COMPREHENSIVE METABOLIC PANEL WITH GFR
ALT: 25 U/L (ref 0–44)
AST: 28 U/L (ref 15–41)
Albumin: 3 g/dL — ABNORMAL LOW (ref 3.5–5.0)
Alkaline Phosphatase: 49 U/L (ref 38–126)
Anion gap: 11 (ref 5–15)
BUN: 23 mg/dL (ref 8–23)
CO2: 24 mmol/L (ref 22–32)
Calcium: 8.6 mg/dL — ABNORMAL LOW (ref 8.9–10.3)
Chloride: 97 mmol/L — ABNORMAL LOW (ref 98–111)
Creatinine, Ser: 1.32 mg/dL — ABNORMAL HIGH (ref 0.61–1.24)
GFR, Estimated: 57 mL/min — ABNORMAL LOW (ref >60)
Glucose, Bld: 121 mg/dL — ABNORMAL HIGH (ref 70–99)
Potassium: 4.2 mmol/L (ref 3.5–5.1)
Sodium: 132 mmol/L — ABNORMAL LOW (ref 135–145)
Total Bilirubin: 1.1 mg/dL (ref 0.0–1.2)
Total Protein: 7.4 g/dL (ref 6.5–8.1)

## 2024-04-28 LAB — CBC WITH DIFFERENTIAL/PLATELET
Abs Immature Granulocytes: 0.03 K/uL (ref 0.00–0.07)
Basophils Absolute: 0 K/uL (ref 0.0–0.1)
Basophils Relative: 0 %
Eosinophils Absolute: 0.1 K/uL (ref 0.0–0.5)
Eosinophils Relative: 1 %
HCT: 42.8 % (ref 39.0–52.0)
Hemoglobin: 14 g/dL (ref 13.0–17.0)
Immature Granulocytes: 0 %
Lymphocytes Relative: 21 %
Lymphs Abs: 1.5 K/uL (ref 0.7–4.0)
MCH: 28.6 pg (ref 26.0–34.0)
MCHC: 32.7 g/dL (ref 30.0–36.0)
MCV: 87.3 fL (ref 80.0–100.0)
Monocytes Absolute: 1.3 K/uL — ABNORMAL HIGH (ref 0.1–1.0)
Monocytes Relative: 18 %
Neutro Abs: 4.2 K/uL (ref 1.7–7.7)
Neutrophils Relative %: 60 %
Platelets: 283 K/uL (ref 150–400)
RBC: 4.9 MIL/uL (ref 4.22–5.81)
RDW: 14.8 % (ref 11.5–15.5)
WBC: 7.1 K/uL (ref 4.0–10.5)
nRBC: 0 % (ref 0.0–0.2)

## 2024-04-28 LAB — POCT URINALYSIS DIP (MANUAL ENTRY)
Blood, UA: NEGATIVE
Glucose, UA: NEGATIVE mg/dL
Leukocytes, UA: NEGATIVE
Nitrite, UA: NEGATIVE
Protein Ur, POC: 100 mg/dL — AB
Spec Grav, UA: >=1.03 — AB (ref 1.010–1.025)
Urobilinogen, UA: 2 U/dL — AB
pH, UA: 5.5 (ref 5.0–8.0)

## 2024-04-28 LAB — LIPASE, BLOOD: Lipase: 39 U/L (ref 11–51)

## 2024-04-28 MED ORDER — PHENAZOPYRIDINE HCL 200 MG PO TABS
200.0000 mg | ORAL_TABLET | ORAL | 0 refills | Status: AC
Start: 2024-04-28 — End: 2024-04-30

## 2024-04-28 NOTE — Discharge Instructions (Addendum)
 You were seen today for symptoms most consistent with foodborne gastroenteritis, which began after eating fried chicken, macaroni and cheese, and a drink from [employer] on Wednesday. Your symptoms included abdominal pain, diarrhea, weakness, and dark urine, which has improved with increased fluids. Your urinalysis today showed moderate bilirubin and trace ketones, and additional blood work has been ordered. For now, increase your fluid intake with water, electrolyte drinks, or diluted juices to prevent dehydration. Follow a bland diet such as toast, crackers, bananas, applesauce, or rice, and avoid fried, greasy, spicy foods, dairy, and red-colored drinks or snacks that could make stool appear bloody. Continue using Pepto-Bismol as needed for abdominal discomfort, and avoid anti-diarrheal medications so your body can clear the illness naturally. Slowly return to your regular diet as your symptoms improve.  You also reported burning and pain with urination. Your urinalysis did not show infection, but a urine culture is pending. Pyridium  has been prescribed to help relieve urinary discomfort, which may temporarily turn your urine orange. You already have a scheduled appointment with your urologist for further evaluation of your elevated PSA.  Follow up with your primary care provider if your symptoms are not improving within a few days or if urinary symptoms worsen. Go to the emergency department right away if you develop severe abdominal pain, bloody or black stools, persistent vomiting, inability to keep fluids down, high fever, confusion, dizziness, inability to urinate, or worsening weakness.

## 2024-04-28 NOTE — ED Triage Notes (Signed)
 Pt states had a headache, abdominal pain, and diarrhea since Thursday. States no diarrhea since last night. States now having weakness and dark urine. States been drinking more water today and urine has cleared up.

## 2024-04-28 NOTE — ED Provider Notes (Signed)
 [facility]    CSN: [id number] Arrival date & time: 04/28/24  1146      History   Chief Complaint Chief Complaint  Patient presents with   Diarrhea    HPI Javier [name] is a 73 y.o. male.   Discussed the use of AI scribe software for clinical note transcription with the patient, who gave verbal consent to proceed.   Patient presents with a 4-day history of headache, abdominal pain, and diarrhea that began on Thursday after consuming a meal from [employer]. The patient reports moderate abdominal pain located above the belly button, which lasted from Thursday to Saturday. The diarrhea was described as watery and brown, with no episodes last night or today. The headache has improved; the abdominal pain and diarrhea has resolved.  Associated symptoms include generalized weakness and dark colored urine, which has since cleared up after increased fluid intake. The patient denies nausea, vomiting, black or bloody stools, rectal pain, or genital swelling. However, he reports pain in the penis during urination as well as dysuria. Chills were experienced, possibly due to air conditioning, but no fevers were noted. The patient's appetite and fluid intake were decreased until today. The patient consumed a [employer] before the visit, tolerated it well despite the greasy nature of the food.  To manage symptoms, the patient has been taking Pepto-Bismol and Imodium for discomfort. He reports no recent travel or antibiotic use in the past three months.  The following portions of the patient's history were reviewed and updated as appropriate: allergies, current medications, past family history, past medical history, past social history, past surgical history, and problem list.    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   Low testosterone  in male 06/22/2023   Insomnia 10/06/2022   OSA (obstructive sleep apnea) 07/04/2022   Fatigue 05/16/2022   Morbid obesity (HCC) 05/16/2022   Pain due to onychomycosis of toenails of both feet 03/10/2022   Prediabetes 10/04/2015   Anxiety disorder 06/02/2011   ERECTILE DYSFUNCTION, 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 Authorizing Provider  phenazopyridine  (PYRIDIUM ) 200 MG tablet Take 1 tablet (200 mg total) by mouth 3 (three) times daily at 8am, 3pm and bedtime for 2 days. 04/28/24 04/30/24 Yes Iola Lukes, FNP  diclofenac  (VOLTAREN ) 75 MG EC tablet TAKE 1 TABLET (75 MG TOTAL) BY MOUTH TWICE A DAY WITH FOOD Patient not taking: Reported on 04/28/2024 04/21/24   Ozell Heron HERO, MD  hydrALAZINE  (APRESOLINE ) 50 MG tablet TAKE 1 TABLET BY MOUTH THREE TIMES A DAY 03/19/24   Ozell Heron HERO, MD  metoprolol   succinate (TOPROL -XL) 100 MG 24 hr tablet TAKE 1 TABLET BY MOUTH EVERY DAY WITH OR IMMEDIATELY FOLLOWING A MEAL 03/19/24   Ozell Heron HERO, MD  Olmesartan -amLODIPine -HCTZ 40-10-25 MG TABS TAKE 1 TABLET BY MOUTH EVERY DAY 03/19/24   Ozell Heron HERO, MD  potassium chloride  SA (KLOR-CON  M) 20 MEQ tablet TAKE 1 TABLET BY MOUTH EVERY DAY 03/19/24   Ozell Heron HERO, MD  rosuvastatin   (CRESTOR ) 5 MG tablet TAKE 1 TABLET (5 MG TOTAL) BY MOUTH DAILY. 03/19/24   Ozell Heron HERO, MD  spironolactone  (ALDACTONE ) 25 MG tablet TAKE 1 TABLET (25 MG TOTAL) BY MOUTH DAILY. 03/19/24   Ozell Heron HERO, MD  tamsulosin  (FLOMAX ) 0.4 MG CAPS capsule Take 1 capsule (0.4 mg total) by mouth at bedtime. 02/08/24   Ozell Heron HERO, MD  Testosterone  (ANDROGEL ) 40.5 MG/2.5GM (1.62%) GEL Place 40.5 mg onto the skin daily. 02/08/24   Ozell Heron HERO, MD    Family History Family History  Problem Relation Age of Onset   Heart failure Mother    Other Father        no known med history   High blood pressure Sister    High blood pressure Sister     Social History Social History   Tobacco Use   Smoking status: Never   Smokeless tobacco: Never  Vaping Use   Vaping status: Never Used  Substance Use Topics   Alcohol use: No   Drug use: No     Allergies   Catapres  [clonidine  hcl], Lipitor [atorvastatin ], and Pravachol  [pravastatin ]   Review of Systems Review of Systems  Constitutional:  Positive for chills. Negative for appetite change and fever.  HENT: Negative.    Gastrointestinal:  Positive for abdominal pain, diarrhea and rectal pain. Negative for blood in stool, nausea and vomiting.  Genitourinary:  Positive for dysuria and penile pain (only when urinating). Negative for hematuria.  Musculoskeletal:  Negative for myalgias.  Neurological:  Positive for weakness (generalized) and headaches.  All other systems reviewed and are negative.    Physical Exam Triage Vital Signs ED Triage Vitals  Encounter Vitals Group     BP 04/28/24 1309 (!) 121/51     Girls Systolic BP Percentile --      Girls Diastolic BP Percentile --      Boys Systolic BP Percentile --      Boys Diastolic BP Percentile --      Pulse Rate 04/28/24 1309 66     Resp 04/28/24 1309 18     Temp 04/28/24 1309 98.3 F (36.8 C)     Temp Source 04/28/24 1309 Oral     SpO2 04/28/24 1309 98 %     Weight --       Height --      Head Circumference --      Peak Flow --      Pain Score 04/28/24 1310 0     Pain Loc --      Pain Education --      Exclude from Growth Chart --    No data found.  Updated Vital Signs BP (!) 121/51 (BP Location: Right Arm)   Pulse 66   Temp 98.3 F (36.8 C) (Oral)   Resp 18   SpO2 98%   Visual Acuity Right Eye Distance:   Left Eye Distance:   Bilateral Distance:    Right Eye Near:   Left Eye Near:    Bilateral Near:     Physical Exam Vitals reviewed.  Constitutional:      General: He is awake. He is not in acute distress.    Appearance: Normal appearance. He is well-developed. He is not ill-appearing, toxic-appearing or diaphoretic.  HENT:     Head: Normocephalic.     Right Ear: Hearing normal.     Left Ear: Hearing normal.     Nose: Nose normal.     Mouth/Throat:     Mouth: Mucous membranes are moist.  Eyes:     General: Vision grossly intact.     Conjunctiva/sclera: Conjunctivae normal.  Cardiovascular:     Rate and Rhythm: Normal rate and regular rhythm.     Heart sounds: Normal heart sounds.  Pulmonary:     Effort: Pulmonary effort is normal.     Breath sounds: Normal breath sounds and air entry.  Abdominal:     General: Bowel sounds are normal. There is no distension.     Palpations: Abdomen is soft.     Tenderness: There is no abdominal tenderness. There is no right CVA tenderness or left CVA tenderness.  Musculoskeletal:        General: Normal range of motion.     Cervical back: Full passive range of motion without pain, normal range of motion and neck supple.  Skin:    General: Skin is warm and dry.  Neurological:     General: No focal deficit present.     Mental Status: He is alert and oriented to person, place, and time.  Psychiatric:        Speech: Speech normal.        Behavior: Behavior is cooperative.      UC Treatments / Results  Labs (all labs ordered are listed, but only abnormal results are displayed) Labs  Reviewed  CBC WITH DIFFERENTIAL/PLATELET - Abnormal; Notable for the following components:      Result Value   Monocytes Absolute 1.3 (*)    All other components within normal limits  POCT URINALYSIS DIP (MANUAL ENTRY) - Abnormal; Notable for the following components:   Color, UA red (*)    Clarity, UA turbid (*)    Bilirubin, UA moderate (*)    Ketones, POC UA trace (5) (*)    Spec Grav, UA >=1.030 (*)    Protein Ur, POC =100 (*)    Urobilinogen, UA 2.0 (*)    All other components within normal limits  URINE CULTURE  COMPREHENSIVE METABOLIC PANEL WITH GFR  LIPASE, BLOOD    EKG   Radiology No results found.  Procedures Procedures (including critical care time)  Medications Ordered in UC Medications - No data to display  Initial Impression / Assessment and Plan / UC Course  I have reviewed the triage vital signs and the nursing notes.  Pertinent labs & imaging results that were available during my care of the patient were reviewed by me and considered in my medical decision making (see chart for details).     The patient presents with symptoms consistent with acute gastroenteritis and colitis, likely foodborne, beginning after consuming fried chicken, macaroni and cheese, and a drink from [employer] on Wednesday. Symptoms included headache, abdominal pain above the umbilicus, and watery brown diarrhea starting Thursday, with gradual improvement noted yesterday and today. No fever was reported, though chills were present. Associated symptoms included weakness and dark urine, which improved with increased hydration. Urinalysis showed moderate bilirubin and trace ketones, raising the possibility of intra-abdominal involvement; however, recent April labs showed normal kidney and liver function. Based  on the temporal relationship with food intake and clinical presentation, foodborne gastroenteritis is most likely. Supportive management was advised, including increased fluid  intake, bland diet, continued use of Pepto-Bismol for discomfort, avoidance of anti-diarrheal medications, and gradual return to a regular diet as tolerated. Blood work including CBC, CMP, and lipase was ordered.  The patient also reports dysuria and penile pain occurring only during urination. Urinalysis showed no infection, and urine culture is pending. Pyridium  was prescribed for symptomatic relief. He has an upcoming appointment with urology for evaluation of elevated PSA. Follow-up with primary care is recommended if symptoms worsen, and ED evaluation was advised for severe abdominal pain, persistent vomiting, inability to hydrate, bloody diarrhea, high fever, or urinary retention.  Today's evaluation has revealed no signs of a dangerous process. Discussed diagnosis with patient and/or guardian. Patient and/or guardian aware of their diagnosis, possible red flag symptoms to watch out for and need for close follow up. Patient and/or guardian understands verbal and written discharge instructions. Patient and/or guardian comfortable with plan and disposition.  Patient and/or guardian has a clear mental status at this time, good insight into illness (after discussion and teaching) and has clear judgment to make decisions regarding their care  Documentation was completed with the aid of voice recognition software. Transcription may contain typographical errors.  Final Clinical Impressions(s) / UC Diagnoses   Final diagnoses:  Gastroenteritis and colitis, viral     Discharge Instructions      You were seen today for symptoms most consistent with foodborne gastroenteritis, which began after eating fried chicken, macaroni and cheese, and a drink from [employer] on Wednesday. Your symptoms included abdominal pain, diarrhea, weakness, and dark urine, which has improved with increased fluids. Your urinalysis today showed moderate bilirubin and trace ketones, and additional blood work has been  ordered. For now, increase your fluid intake with water, electrolyte drinks, or diluted juices to prevent dehydration. Follow a bland diet such as toast, crackers, bananas, applesauce, or rice, and avoid fried, greasy, spicy foods, dairy, and red-colored drinks or snacks that could make stool appear bloody. Continue using Pepto-Bismol as needed for abdominal discomfort, and avoid anti-diarrheal medications so your body can clear the illness naturally. Slowly return to your regular diet as your symptoms improve.  You also reported burning and pain with urination. Your urinalysis did not show infection, but a urine culture is pending. Pyridium  has been prescribed to help relieve urinary discomfort, which may temporarily turn your urine orange. You already have a scheduled appointment with your urologist for further evaluation of your elevated PSA.  Follow up with your primary care provider if your symptoms are not improving within a few days or if urinary symptoms worsen. Go to the emergency department right away if you develop severe abdominal pain, bloody or black stools, persistent vomiting, inability to keep fluids down, high fever, confusion, dizziness, inability to urinate, or worsening weakness.      ED Prescriptions     Medication Sig Dispense Auth. Provider   phenazopyridine  (PYRIDIUM ) 200 MG tablet Take 1 tablet (200 mg total) by mouth 3 (three) times daily at 8am, 3pm and bedtime for 2 days. 6 tablet Iola Lukes, FNP      PDMP not reviewed this encounter.   Iola Lukes, [place] 04/28/24 [phone]

## 2024-04-29 ENCOUNTER — Ambulatory Visit (HOSPITAL_COMMUNITY): Payer: Self-pay

## 2024-04-29 LAB — URINE CULTURE: Culture: <10000 — AB

## 2024-04-29 NOTE — Telephone Encounter (Signed)
 CBC is essentially normal, metabolic panel close concerning for dehydration secondary to patient's complaints of diarrhea.  Recommend repeat CMP in 1 week to ensure abnormal findings have resolved.

## 2024-05-19 ENCOUNTER — Other Ambulatory Visit: Payer: Self-pay | Admitting: Family Medicine

## 2024-05-19 DIAGNOSIS — E291 Testicular hypofunction: Secondary | ICD-10-CM

## 2024-05-23 LAB — HM DIABETES EYE EXAM

## 2024-07-11 ENCOUNTER — Ambulatory Visit: Admitting: Family Medicine

## 2024-07-11 VITALS — BP 114/60 | HR 64 | Temp 98.5°F | Ht 67.5 in | Wt 262.5 lb

## 2024-07-11 DIAGNOSIS — R7303 Prediabetes: Secondary | ICD-10-CM | POA: Diagnosis not present

## 2024-07-11 DIAGNOSIS — R972 Elevated prostate specific antigen [PSA]: Secondary | ICD-10-CM | POA: Diagnosis not present

## 2024-07-11 DIAGNOSIS — Z23 Encounter for immunization: Secondary | ICD-10-CM

## 2024-07-11 DIAGNOSIS — G4733 Obstructive sleep apnea (adult) (pediatric): Secondary | ICD-10-CM

## 2024-07-11 DIAGNOSIS — I10 Essential (primary) hypertension: Secondary | ICD-10-CM

## 2024-07-11 LAB — COMPREHENSIVE METABOLIC PANEL WITH GFR
ALT: 18 U/L (ref 0–53)
AST: 22 U/L (ref 0–37)
Albumin: 3.9 g/dL (ref 3.5–5.2)
Alkaline Phosphatase: 59 U/L (ref 39–117)
BUN: 19 mg/dL (ref 6–23)
CO2: 26 meq/L (ref 19–32)
Calcium: 9.2 mg/dL (ref 8.4–10.5)
Chloride: 103 meq/L (ref 96–112)
Creatinine, Ser: 1.01 mg/dL (ref 0.40–1.50)
GFR: 73.75 mL/min (ref >60.00)
Glucose, Bld: 127 mg/dL — ABNORMAL HIGH (ref 70–99)
Potassium: 4.2 meq/L (ref 3.5–5.1)
Sodium: 139 meq/L (ref 135–145)
Total Bilirubin: 0.6 mg/dL (ref 0.2–1.2)
Total Protein: 7.3 g/dL (ref 6.0–8.3)

## 2024-07-11 LAB — PSA, MEDICARE: PSA: 4.93 ng/mL — ABNORMAL HIGH (ref 0.10–4.00)

## 2024-07-11 LAB — HEMOGLOBIN A1C: Hgb A1c MFr Bld: 6.5 % (ref 4.6–6.5)

## 2024-07-11 NOTE — Assessment & Plan Note (Signed)
 Doing ok on CPAP, has been on this for many years, we discussed the possibility of using Zepbound to help reduce weight, control blood sugars and improve the sleep apnea, will check labs before deciding.

## 2024-07-11 NOTE — Progress Notes (Signed)
 Complete physical exam  Patient: Javier [name]   DOB: 31-Mar-1951   73 y.o. Male  MRN: [id number]  Subjective:    Chief Complaint  Patient presents with   Follow-up    Patient asking for vitamin blood work check. Patient states tamsulosin  makes him urinate a lot at night if he takes the medication frequently. Wants Flu vaccine.     Javier [name] is a 73 y.o. male who presents today for a complete physical exam. He reports consuming a general diet. Lunch is usually fast food while he is at work, dinner is sometimes a rich foods like fried foods. The patient has a physically strenuous job, but has no regular exercise apart from work.  He generally feels well. He reports sleeping fairly well. He does not have additional problems to discuss today.    Most recent fall risk assessment:    07/11/2024   10:11 AM  Fall Risk   Falls in the past year? 0  Number falls in past yr: 0  Injury with Fall? 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   Low testosterone  in male 06/22/2023   Insomnia 10/06/2022   OSA (obstructive sleep apnea) 07/04/2022   Fatigue 05/16/2022   Morbid obesity (HCC) 05/16/2022   Pain due to onychomycosis of toenails of both feet 03/10/2022   Prediabetes 10/04/2015   Anxiety disorder 06/02/2011   ERECTILE DYSFUNCTION, 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   HYPOKALEMIA 03/02/2009   ELEVATED PROSTATE SPECIFIC ANTIGEN 02/26/2009   FASCIITIS, PLANTAR 09/30/2007   Essential hypertension 02/20/2007   GERD 02/20/2007   BPH (benign prostatic hyperplasia) 02/20/2007      Patient Care Team: Ozell Heron HERO, MD as PCP - General (Family  Medicine)   Outpatient Medications Prior to Visit  Medication Sig   hydrALAZINE  (APRESOLINE ) 50 MG tablet TAKE 1 TABLET BY MOUTH THREE TIMES A DAY   metoprolol  succinate (TOPROL -XL) 100 MG 24 hr tablet TAKE 1 TABLET BY MOUTH EVERY DAY WITH OR IMMEDIATELY FOLLOWING A MEAL   Olmesartan -amLODIPine -HCTZ 40-10-25 MG TABS TAKE 1 TABLET BY MOUTH EVERY DAY   potassium chloride  SA (KLOR-CON  M) 20 MEQ tablet TAKE 1 TABLET BY MOUTH EVERY DAY   rosuvastatin  (CRESTOR ) 5 MG tablet TAKE 1 TABLET (5 MG TOTAL) BY MOUTH DAILY.   spironolactone  (ALDACTONE ) 25 MG tablet TAKE 1 TABLET (25 MG TOTAL) BY MOUTH DAILY.   tamsulosin  (FLOMAX ) 0.4 MG CAPS capsule Take 1 capsule (0.4 mg total) by mouth at bedtime.   Testosterone  40.5 MG/2.5GM (1.62%) GEL PLACE 1 PACKET (40.5MG ) ONTO THE SKIN DAILY   [DISCONTINUED] diclofenac  (VOLTAREN ) 75 MG EC tablet TAKE 1 TABLET (75 MG TOTAL) BY MOUTH TWICE A DAY WITH FOOD (Patient not taking: Reported on 07/11/2024)   No facility-administered medications prior to visit.    Review of Systems  HENT:  Negative for hearing loss.   Eyes:  Negative for blurred vision.  Respiratory:  Negative for shortness of breath.   Cardiovascular:  Negative for chest pain.  Gastrointestinal: Negative.   Genitourinary: Negative.   Musculoskeletal:  Negative for  back pain.  Neurological:  Negative for headaches.  Psychiatric/Behavioral:  Negative for depression.        Objective:     BP 114/60   Pulse 64   Temp 98.5 F (36.9 C) (Oral)   Ht 5' 7.5 (1.715 m)   Wt 262 lb 8 oz (119.1 kg)   SpO2 96%   BMI 40.51 kg/m    Physical Exam Vitals reviewed.  Constitutional:      Appearance: Normal appearance. He is well-groomed. He is morbidly obese.  HENT:     Right Ear: Tympanic membrane and ear canal normal.     Left Ear: Tympanic membrane and ear canal normal.     Mouth/Throat:     Mouth: Mucous membranes are moist.     Pharynx: No posterior oropharyngeal erythema.  Eyes:      Extraocular Movements: Extraocular movements intact.     Conjunctiva/sclera: Conjunctivae normal.  Neck:     Thyroid : No thyromegaly.  Cardiovascular:     Rate and Rhythm: Normal rate and regular rhythm.     Heart sounds: S1 normal and S2 normal. No murmur heard. Pulmonary:     Effort: Pulmonary effort is normal.     Breath sounds: Normal breath sounds and air entry. No rales.  Abdominal:     General: Abdomen is flat. Bowel sounds are normal.  Musculoskeletal:     Right lower leg: No edema.     Left lower leg: No edema.  Lymphadenopathy:     Cervical: No cervical adenopathy.  Neurological:     General: No focal deficit present.     Mental Status: He is alert and oriented to person, place, and time.     Gait: Gait is intact.  Psychiatric:        Mood and Affect: Mood and affect normal.      No results found for any visits on 07/11/24.     Assessment & Plan:    Routine Health Maintenance and Physical Exam  Immunization History  Administered Date(s) Administered   Fluad Quad(high Dose 65+) 06/02/2019, 06/23/2022   Fluad Trivalent(High Dose 65+) 06/22/2023   INFLUENZA, HIGH DOSE SEASONAL PF 07/01/2018, 07/11/2024   Influenza Split 07/20/2011, 06/03/2012   Influenza,inj,Quad PF,6+ Mos 08/04/2013, 07/15/2014   Influenza-Unspecified 07/24/2015, 07/24/2015, 08/05/2016   PFIZER Comirnaty(Gray Top)Covid-19 Tri-Sucrose Vaccine 02/04/2021   PFIZER(Purple Top)SARS-COV-2 Vaccination 12/03/2019, 12/24/2019, 07/23/2020   PNEUMOCOCCAL CONJUGATE-20 02/04/2021, 01/27/2022   Pfizer Covid-19 Vaccine Bivalent Booster 32yrs & up 09/12/2021   Pneumococcal Conjugate-13 08/05/2016, 07/02/2017   Td 03/02/2009   Zoster Recombinant(Shingrix ) 01/27/2022    Health Maintenance  Topic Date Due   DTaP/Tdap/Td (2 - Tdap) 03/03/2019   Zoster Vaccines- Shingrix  (2 of 2) 03/24/2022   COVID-19 Vaccine (6 - 2025-26 season) 05/19/2024   Diabetic kidney evaluation - Urine ACR  02/07/2025 (Originally  12/13/1968)   HEMOGLOBIN A1C  08/10/2024   Diabetic kidney evaluation - eGFR measurement  04/28/2025   OPHTHALMOLOGY EXAM  05/23/2025   Fecal DNA (Cologuard)  03/29/2026   Pneumococcal Vaccine: 50+ Years  Completed   Influenza Vaccine  Completed   Hepatitis C Screening  Completed   Meningococcal B Vaccine  Aged Out   FOOT EXAM  Discontinued   Colonoscopy  Discontinued    Discussed health benefits of physical activity, and encouraged him to engage in regular exercise appropriate for his age and condition.  Immunization due -     Flu vaccine HIGH DOSE PF(Fluzone Trivalent)  Essential hypertension -  Comprehensive metabolic panel with GFR; Future  Prediabetes Assessment & Plan: Pt used to be on metformin  but it was stopped in 2022, will check new A1c today in follow up, only in the prediabetes category, he is not considered a full diabetic.   Orders: -     Hemoglobin A1c; Future  Elevated PSA -     PSA, Medicare; Future  Morbid obesity (HCC)  OSA (obstructive sleep apnea) Assessment & Plan: Doing ok on CPAP, has been on this for many years, we discussed the possibility of using Zepbound to help reduce weight, control blood sugars and improve the sleep apnea, will check labs before deciding.   General physical exam findings are normal today. I reviewed the patient's preventative testing, immunizations, and lifestyle habits. I made appropriate recommendations and placed orders for the appropriate tests and/or vaccinations. I counseled the patient on the CDC's recommendations for healthy exercise and diet. I counseled the patient on healthy sleep habits and stress management. Handouts to reinforce the counseling were given at the conclusion of the visit.    Return in about 6 months (around 01/09/2025) for HTN.     Heron CHRISTELLA Sharper, MD

## 2024-07-11 NOTE — Assessment & Plan Note (Signed)
 Pt used to be on metformin  but it was stopped in 2022, will check new A1c today in follow up, only in the prediabetes category, he is not considered a full diabetic.

## 2024-07-15 ENCOUNTER — Ambulatory Visit: Payer: Self-pay | Admitting: Family Medicine

## 2024-07-17 ENCOUNTER — Telehealth: Payer: Self-pay | Admitting: *Deleted

## 2024-07-17 NOTE — Telephone Encounter (Unsigned)
 Copied from CRM [id number]. Topic: Clinical - Lab/Test Results >> Jul 17, 2024  3:59 PM Mia F wrote: Reason for CRM: Pt says he received a call stating that his labs were good. He wants to know if he can now cancel his Urology appt. Please contact pt asap as his appt is tomorrow

## 2024-07-18 NOTE — Telephone Encounter (Signed)
 I would still have him go to the appt just in case

## 2024-07-18 NOTE — Progress Notes (Signed)
 B12 is not indicated unless he is having symptoms like numbness/tingling or other neuropathy symptoms.

## 2024-07-18 NOTE — Telephone Encounter (Signed)
 Left a detailed message on the patient's cell number voicemail with the instructions as below.

## 2024-08-06 ENCOUNTER — Other Ambulatory Visit: Payer: Self-pay | Admitting: Family Medicine

## 2024-08-06 DIAGNOSIS — M722 Plantar fascial fibromatosis: Secondary | ICD-10-CM

## 2024-08-30 ENCOUNTER — Ambulatory Visit
Admission: RE | Admit: 2024-08-30 | Discharge: 2024-08-30 | Disposition: A | Discharge status: Home / Self Care | Source: Ambulatory Visit | Attending: Urology | Admitting: Urology

## 2024-08-30 DIAGNOSIS — R972 Elevated prostate specific antigen [PSA]: Secondary | ICD-10-CM

## 2024-08-30 MED ORDER — GADOPICLENOL 0.5 MMOL/ML IV SOLN
10.0000 mL | Freq: Once | INTRAVENOUS | Status: AC | PRN
  Administered 2024-08-30: 12:00:00 10 mL via INTRAVENOUS

## 2024-09-09 ENCOUNTER — Other Ambulatory Visit: Payer: Self-pay | Admitting: Family Medicine

## 2024-09-09 DIAGNOSIS — I10 Essential (primary) hypertension: Secondary | ICD-10-CM

## 2024-10-14 ENCOUNTER — Other Ambulatory Visit: Payer: Self-pay | Admitting: Family Medicine

## 2025-01-09 ENCOUNTER — Ambulatory Visit: Admitting: Family Medicine
